# Patient Record
Sex: Female | Born: 1955
Health system: Southern US, Community
[De-identification: ages and names within clinical notes are randomized; demographics above are authoritative.]

## PROBLEM LIST (undated history)

## (undated) DIAGNOSIS — I5189 Other ill-defined heart diseases: Secondary | ICD-10-CM

## (undated) DIAGNOSIS — E781 Pure hyperglyceridemia: Secondary | ICD-10-CM

## (undated) DIAGNOSIS — K219 Gastro-esophageal reflux disease without esophagitis: Secondary | ICD-10-CM

## (undated) DIAGNOSIS — H269 Unspecified cataract: Secondary | ICD-10-CM

## (undated) DIAGNOSIS — Z9289 Personal history of other medical treatment: Secondary | ICD-10-CM

## (undated) DIAGNOSIS — Q613 Polycystic kidney, unspecified: Secondary | ICD-10-CM

## (undated) DIAGNOSIS — I10 Essential (primary) hypertension: Secondary | ICD-10-CM

## (undated) DIAGNOSIS — J449 Chronic obstructive pulmonary disease, unspecified: Secondary | ICD-10-CM

## (undated) DIAGNOSIS — E785 Hyperlipidemia, unspecified: Secondary | ICD-10-CM

## (undated) DIAGNOSIS — R002 Palpitations: Secondary | ICD-10-CM

## (undated) DIAGNOSIS — I059 Rheumatic mitral valve disease, unspecified: Secondary | ICD-10-CM

## (undated) DIAGNOSIS — T7840XA Allergy, unspecified, initial encounter: Secondary | ICD-10-CM

## (undated) DIAGNOSIS — G4730 Sleep apnea, unspecified: Secondary | ICD-10-CM

## (undated) DIAGNOSIS — E7800 Pure hypercholesterolemia, unspecified: Secondary | ICD-10-CM

## (undated) DIAGNOSIS — M1990 Unspecified osteoarthritis, unspecified site: Secondary | ICD-10-CM

## (undated) DIAGNOSIS — K5792 Diverticulitis of intestine, part unspecified, without perforation or abscess without bleeding: Secondary | ICD-10-CM

## (undated) HISTORY — DX: Palpitations: R00.2

## (undated) HISTORY — DX: Pure hyperglyceridemia: E78.1

## (undated) HISTORY — DX: Polycystic kidney, unspecified: Q61.3

## (undated) HISTORY — DX: Pure hypercholesterolemia, unspecified: E78.00

## (undated) HISTORY — DX: Hyperlipidemia, unspecified: E78.5

## (undated) HISTORY — DX: Personal history of other medical treatment: Z92.89

## (undated) HISTORY — DX: Sleep apnea, unspecified: G47.30

## (undated) HISTORY — DX: Other ill-defined heart diseases: I51.89

## (undated) HISTORY — DX: Allergy, unspecified, initial encounter: T78.40XA

## (undated) HISTORY — PX: UPPER GASTROINTESTINAL ENDOSCOPY: SHX188

## (undated) HISTORY — PX: COLONOSCOPY: SHX174

## (undated) HISTORY — DX: Unspecified cataract: H26.9

## (undated) HISTORY — DX: Essential (primary) hypertension: I10

## (undated) HISTORY — DX: Rheumatic mitral valve disease, unspecified: I05.9

## (undated) HISTORY — PX: CATARACT EXTRACTION: SUR2

## (undated) HISTORY — PX: OTHER SURGICAL HISTORY: SHX169

## (undated) HISTORY — PX: TOTAL ABDOMINAL HYSTERECTOMY W/ BILATERAL SALPINGOOPHORECTOMY: SHX83

## (undated) HISTORY — DX: Unspecified osteoarthritis, unspecified site: M19.90

## (undated) HISTORY — DX: Chronic obstructive pulmonary disease, unspecified: J44.9

## (undated) HISTORY — DX: Gastro-esophageal reflux disease without esophagitis: K21.9

---

## 2001-06-05 ENCOUNTER — Encounter: Payer: Self-pay | Admitting: Cardiology

## 2001-06-05 ENCOUNTER — Ambulatory Visit (HOSPITAL_COMMUNITY): Admission: RE | Admit: 2001-06-05 | Discharge: 2001-06-05 | Payer: Self-pay | Admitting: Cardiology

## 2001-08-15 ENCOUNTER — Ambulatory Visit (HOSPITAL_COMMUNITY): Admission: RE | Admit: 2001-08-15 | Discharge: 2001-08-15 | Payer: Self-pay | Admitting: Family Medicine

## 2001-08-15 ENCOUNTER — Encounter: Payer: Self-pay | Admitting: Family Medicine

## 2001-09-02 ENCOUNTER — Encounter: Admission: RE | Admit: 2001-09-02 | Discharge: 2001-12-01 | Payer: Self-pay | Admitting: Orthopedic Surgery

## 2001-10-14 ENCOUNTER — Other Ambulatory Visit: Admission: RE | Admit: 2001-10-14 | Discharge: 2001-10-14 | Payer: Self-pay | Admitting: Gynecology

## 2001-12-17 ENCOUNTER — Encounter: Payer: Self-pay | Admitting: Gynecology

## 2001-12-25 ENCOUNTER — Encounter (INDEPENDENT_AMBULATORY_CARE_PROVIDER_SITE_OTHER): Payer: Self-pay | Admitting: *Deleted

## 2001-12-26 ENCOUNTER — Inpatient Hospital Stay (HOSPITAL_COMMUNITY): Admission: RE | Admit: 2001-12-26 | Discharge: 2001-12-27 | Payer: Self-pay | Admitting: Gynecology

## 2003-01-20 ENCOUNTER — Other Ambulatory Visit: Admission: RE | Admit: 2003-01-20 | Discharge: 2003-01-20 | Payer: Self-pay | Admitting: Gynecology

## 2004-01-26 ENCOUNTER — Other Ambulatory Visit: Admission: RE | Admit: 2004-01-26 | Discharge: 2004-01-26 | Payer: Self-pay | Admitting: Gynecology

## 2005-07-04 ENCOUNTER — Emergency Department (HOSPITAL_COMMUNITY): Admission: EM | Admit: 2005-07-04 | Discharge: 2005-07-04 | Payer: Self-pay | Admitting: Emergency Medicine

## 2005-07-14 ENCOUNTER — Ambulatory Visit: Payer: Self-pay | Admitting: Cardiology

## 2005-07-20 ENCOUNTER — Ambulatory Visit: Payer: Self-pay | Admitting: Gastroenterology

## 2005-07-28 ENCOUNTER — Ambulatory Visit: Payer: Self-pay

## 2005-07-31 ENCOUNTER — Encounter (INDEPENDENT_AMBULATORY_CARE_PROVIDER_SITE_OTHER): Payer: Self-pay | Admitting: *Deleted

## 2005-07-31 ENCOUNTER — Ambulatory Visit: Payer: Self-pay | Admitting: Gastroenterology

## 2005-08-23 ENCOUNTER — Other Ambulatory Visit: Admission: RE | Admit: 2005-08-23 | Discharge: 2005-08-23 | Payer: Self-pay | Admitting: Gynecology

## 2005-08-31 ENCOUNTER — Ambulatory Visit: Payer: Self-pay | Admitting: Gastroenterology

## 2005-11-02 ENCOUNTER — Ambulatory Visit: Payer: Self-pay | Admitting: Gastroenterology

## 2005-11-14 ENCOUNTER — Ambulatory Visit (HOSPITAL_COMMUNITY): Admission: RE | Admit: 2005-11-14 | Discharge: 2005-11-14 | Payer: Self-pay | Admitting: Unknown Physician Specialty

## 2006-09-25 ENCOUNTER — Other Ambulatory Visit: Admission: RE | Admit: 2006-09-25 | Discharge: 2006-09-25 | Payer: Self-pay | Admitting: Gynecology

## 2007-08-19 ENCOUNTER — Ambulatory Visit: Payer: Self-pay | Admitting: *Deleted

## 2007-08-20 ENCOUNTER — Observation Stay (HOSPITAL_COMMUNITY): Admission: EM | Admit: 2007-08-20 | Discharge: 2007-08-20 | Payer: Self-pay | Admitting: Emergency Medicine

## 2008-10-20 DIAGNOSIS — R002 Palpitations: Secondary | ICD-10-CM | POA: Insufficient documentation

## 2008-12-11 ENCOUNTER — Encounter (INDEPENDENT_AMBULATORY_CARE_PROVIDER_SITE_OTHER): Payer: Self-pay | Admitting: *Deleted

## 2008-12-11 DIAGNOSIS — K219 Gastro-esophageal reflux disease without esophagitis: Secondary | ICD-10-CM | POA: Insufficient documentation

## 2008-12-11 DIAGNOSIS — M5030 Other cervical disc degeneration, unspecified cervical region: Secondary | ICD-10-CM | POA: Insufficient documentation

## 2008-12-11 DIAGNOSIS — E785 Hyperlipidemia, unspecified: Secondary | ICD-10-CM | POA: Insufficient documentation

## 2008-12-11 DIAGNOSIS — I080 Rheumatic disorders of both mitral and aortic valves: Secondary | ICD-10-CM | POA: Insufficient documentation

## 2010-02-14 ENCOUNTER — Encounter: Payer: Self-pay | Admitting: Orthopedic Surgery

## 2010-02-14 ENCOUNTER — Emergency Department (HOSPITAL_COMMUNITY)
Admission: EM | Admit: 2010-02-14 | Discharge: 2010-02-14 | Payer: Self-pay | Source: Home / Self Care | Admitting: Emergency Medicine

## 2010-02-15 ENCOUNTER — Telehealth: Payer: Self-pay | Admitting: Orthopedic Surgery

## 2010-02-15 ENCOUNTER — Ambulatory Visit: Payer: Self-pay | Admitting: Orthopedic Surgery

## 2010-02-15 DIAGNOSIS — S92309A Fracture of unspecified metatarsal bone(s), unspecified foot, initial encounter for closed fracture: Secondary | ICD-10-CM | POA: Insufficient documentation

## 2010-02-15 DIAGNOSIS — S9030XA Contusion of unspecified foot, initial encounter: Secondary | ICD-10-CM | POA: Insufficient documentation

## 2010-03-03 ENCOUNTER — Encounter: Payer: Self-pay | Admitting: Orthopedic Surgery

## 2010-03-30 ENCOUNTER — Ambulatory Visit
Admission: RE | Admit: 2010-03-30 | Discharge: 2010-03-30 | Payer: Self-pay | Source: Home / Self Care | Attending: Orthopedic Surgery | Admitting: Orthopedic Surgery

## 2010-04-14 NOTE — Letter (Signed)
Summary: Out of Work  [facility]  [address]  [place], [place] [address]   Phone: [phone]  Fax: [phone]    February 15, 2010   Employee:  Shelley [name]    To Whom It May Concern:   For Medical reasons, please excuse the above named employee from work for the following dates:  Start:   02/14/10  End/Return to work:  02/15/10         Next scheduled appointment  03/30/10 3:30pm   If you need additional information, please feel free to contact our office.         Sincerely,    Terrance Mass, MD

## 2010-04-14 NOTE — Letter (Signed)
Summary: Letter of medical necessity for a cane  Letter of medical necessity for a cane   Imported By: Jacklynn Ganong 03/08/2010 09:59:05  _____________________________________________________________________  External Attachment:    Type:   Image     Comment:   External Document

## 2010-04-14 NOTE — Progress Notes (Signed)
Summary: Sleep with boot on?  Phone Note Call from Patient   Summary of Call: Shelley [name] (2055/04/26)  Wants to know if she is supposed to sleep with the boot on. Her # [id number] Initial call taken by: Shelley [name],  February 15, 2010 11:44 AM  Follow-up for Phone Call        no Follow-up by: Fuller Canada MD,  February 15, 2010 3:25 PM  Additional Follow-up for Phone Call Additional follow up Details #1::        Advised patient Additional Follow-up by: Shelley [name],  February 15, 2010 5:08 PM

## 2010-04-14 NOTE — Assessment & Plan Note (Signed)
Summary: 6 WK RE-CK FOOT/BCBS/CAF   Visit Type:  Follow-up Referring Provider:  [facility]  CC:  left foot fracture.  History of Present Illness: I saw Shelley [name] in the office today for a 6 week  followup visit.  She is a 55 years old woman with the complaint of:  left foot  small avulsion fracture of the proximal 5th Metatarsal   DOI 02-14-10.  Xrays at [facility] on 02-14-10, show transverse fracture, base of left fifth metatarsal. Right foot question spurring at first metatarsal head, no definite acute focal bony abnormalities of right foot.  Medications: Diltiazem HCL ER, Bisoprolol Fumarate, Crestor, Fenofibrate, Alprazolam, Amitriptyline, Prevacid, Trmadol, Ibuprofen.  Complaints: She still has some soreness in the back of her heel.   Allergies: 1)  ! Penicillin 2)  ! Codeine 3)  ! Morphine   Other Orders: Est. Patient Level II (57846)  Patient Instructions: 1)  Please schedule a follow-up appointment as needed. 2)  Take boot off 3)  Do the exercises as shown to help the achilles tendon.   Orders Added: 1)  Est. Patient Level II [96295]

## 2010-04-14 NOTE — Medication Information (Signed)
Summary: Tax adviser   Imported By: Cammie Sickle 02/15/2010 18:40:21  _____________________________________________________________________  External Attachment:    Type:   Image     Comment:   External Document

## 2010-04-14 NOTE — Letter (Signed)
Summary: History form  History form   Imported By: Jacklynn Ganong 02/17/2010 08:54:58  _____________________________________________________________________  External Attachment:    Type:   Image     Comment:   External Document

## 2010-04-14 NOTE — Assessment & Plan Note (Signed)
Summary: ER/fx foot/xrays ap/bcbs/frs   Vital Signs:  Patient profile:   55 year old female Height:      65.5 inches Weight:      173 pounds Temp:     98.7 degrees F  Visit Type:  new patient Referring Provider:  [facility]  CC:  left foot fracture.  History of Present Illness:   Xrays at [facility] on 02-14-10, show transverse fracture, base of left fifth metatarsal. Right foot question spurring at first metatarsal head, no definite acute focal bony abnormalities of right foot.  Medications: Diltiazem HCL ER, Bisoprolol Fumarate, Crestor, Fenofibrate, Alprazolam, Amitriptyline, Prevacid, Trmadol, Ibuprofen.  Shelley [name] stepped off a curb injuring LEFT foot complains of sharp dull throbbing 8/10 pain which is intermittent associated with bruising and numbness in the RIGHT foot which was also injured, as well as swelling.  She is on tramadol and ibuprofen for pain.  Date of injury was December 5.    Allergies (verified): 1)  ! Penicillin 2)  ! Codeine 3)  ! Morphine  Past History:  Past Surgical History: childbirth T A H and B S O  Family History: Reviewed history from 12/11/2008 and no changes required. Father:age 40 alive high chol..history of coronary artery bypass grafting. Mother:age 62 hbp and high chol  Social History: Reviewed history from 12/11/2008 and no changes required. Full Time Alcohol Use - no Regular Exercise - no Drug Use - no pt has no siblings Tobacco Use - Former.   Review of Systems Constitutional:  Denies weight loss, weight gain, fever, chills, and fatigue. Cardiovascular:  Complains of palpitations; denies chest pain, fainting, and murmurs. Respiratory:  Denies short of breath, wheezing, couch, tightness, pain on inspiration, and snoring . Gastrointestinal:  Complains of heartburn; denies nausea, vomiting, diarrhea, constipation, and blood in your stools. Genitourinary:  Denies frequency, urgency, difficulty urinating, painful urination, flank  pain, and bleeding in urine. Neurologic:  Denies numbness, tingling, unsteady gait, dizziness, tremors, and seizure. Musculoskeletal:  Complains of joint pain and muscle pain; denies swelling, instability, stiffness, redness, and heat. Endocrine:  Denies excessive thirst, exessive urination, and heat or cold intolerance. Psychiatric:  Denies nervousness, depression, anxiety, and hallucinations. Skin:  Complains of rash; denies changes in the skin, poor healing, itching, and redness. HEENT:  Denies blurred or double vision, eye pain, redness, and watering. Immunology:  Denies seasonal allergies, sinus problems, and allergic to bee stings. Hemoatologic:  Denies easy bleeding and brusing.  Physical Exam  Additional Exam:  GEN: well developed, well nourished, normal grooming and hygiene, no deformity and normal body habitus.   CDV: pulses are normal, no edema, no erythema. no tenderness  Lymph: normal lymph nodes   Skin: no rashes, skin lesions or open sores   NEURO: normal coordination, reflexes, sensation.   Psyche: awake, alert and oriented. Mood normal   Gait: abnormal  RIGHT foot is tender swollen and bruised over the great toe which has painful range of motion no crepitance.  LEFT foot is tender over the fifth metatarsal with tenderness and they are fairly significant, normal muscle tone in the LEFT foot.  Normal range of motion LEFT ankle.  No instability detected LEFT ankle.     Impression & Recommendations:  Problem # 1:  CLOSED FRACTURE OF METATARSAL BONE (ICD-825.25) Assessment New  The x-rays were done at [facility]. The report and the films have been reviewed.  avulsion not Jones fracture !  Short Cam walker x 6 weeks   Orders: New Patient Level III (  [address])  Problem # 2:  CONTUSION, RIGHT FOOT (ICD-924.20) Assessment: New  Patient Instructions: 1)  You have a small avulsion fracture of the proximal 5th Metatarsal  2)  This will heal in 6-8 weeks  3)  Wear this  brace for 6 weeks then we will re-evaluate the foot  4)  OK TO WORK    Orders Added: 1)  New Patient Level III [60454]  Appended Document: ER/fx foot/xrays ap/bcbs/frs social history the patient works in Veterinary surgeon, does not smoke or drink.  Family history heart disease arthritis cancer  Addendum she is divorced.

## 2010-05-25 ENCOUNTER — Other Ambulatory Visit: Payer: Self-pay | Admitting: Gynecology

## 2010-07-26 NOTE — H&P (Signed)
NAME:  Shelley [name], Shelley [name] NO.:  [id number]   MEDICAL RECORD NO.:  [id number]           PATIENT TYPE:   LOCATION:                                 FACILITY:   PHYSICIAN:  Shelley Holler, MD     DATE OF BIRTH:  12/20/1955   DATE OF ADMISSION:  DATE OF DISCHARGE:                              HISTORY & PHYSICAL   PRIMARY CARE PHYSICIAN:  Shelley Duel, MD.   PRIMARY CARDIOLOGIST:  Shelley Sans. Wall, MD, [facility].   CHIEF COMPLAINT:  Left arm burning and palpitations.   HISTORY:  [name] is a pleasant 55 year old female who presented to  the emergency department with complaints of palpitations and her left  arm burning.  While she was driving, the patient had onset of left arm  tingling and burning sensation.  There was no radiation of the  discomfort.  The patient took her blood pressure at home and it was in  the 190s systolic.  She had no complaints of chest pain, no shortness of  breath, no nausea or vomiting, no diaphoresis.  The discomfort lasted  for about 3 hours.  Currently, the discomfort is much improved.  The  patient also had complaints of palpitations without syncope or  presyncope.  She has had no recent PND or orthopnea, no lower extremity  swelling.   PAST MEDICAL HISTORY:  1. GERD.  2. Hyperlipidemia.  3. Arrhythmia.  4. Mitral regurgitation according to the patient.   MEDICINES:  1. Atenolol 25 mg p.o. daily.  2. Cartia XT 120 mg p.o. daily.  3. Protonix 40 mg p.o. daily.  4. Niaspan 500 mg p.o. daily.  5. Fenofibrate 160 mg p.o. daily.  6. Crestor 5 mg p.o. daily.  7. Bisoprolol 1/2 tab p.o. daily.   ALLERGIES:  1. CODEINE.  2. PENICILLIN.  3. MORPHINE.   SOCIAL HISTORY:  The patient is a former smoker.   FAMILY HISTORY:  Father with history of coronary artery bypass grafting.   REVIEW OF SYSTEMS:  All systems are reviewed in detail and negative  except as history of present illness.   PHYSICAL EXAMINATION:  VITAL SIGNS:   Temperature 97.8, blood pressure  119/59, heart rate 70, respiratory rate 19, and oxygen saturation 98% on  room air.  GENERAL:  A well-developed, obese female, alert and oriented x3, no  apparent distress.  HEENT:  Head is atraumatic and normocephalic.  Pupils are equal, round,  and reactive to light, extraocular movements intact  NECK:  Supple.  No adenopathy.  No JVD.  No carotid bruits.  CHEST/LUNGS:  Clear to auscultation bilaterally with equal bilateral  breath sounds.  CORONARY:  Regular rhythm, normal rate, normal S1 and S2, no murmurs,  rubs, or gallops.  ABDOMEN:  Soft, nontender, and nondistended.  EXTREMITIES:  No clubbing, cyanosis, or edema.  NEUROLOGIC:  No focal deficits.   EKG shows a normal sinus rhythm with no ischemic changes, normal  intervals.   LABORATORY DATA:  CK-MB less than 1.  Troponin less than 0.05.  Myoglobin 33, hematocrit 39, sodium 135, potassium 3.9, chloride 101,  BUN 8, creatinine 0.7, and glucose 117.   IMPRESSION AND PLAN:  A 55 year old female with palpitations and left  arm burning sensation.   PLAN:  1. Cardiovascular:  Admit the patient to a telemetry bed, rule out      with serial cardiac enzymes, daily EKG, aspirin daily, home dose of      Niaspan, Tricor, Crestor, and atenolol.  We will hold the patient's      other beta-blocker given her dose of atenolol.  Continue calcium      channel blocker.  I doubt that her palpitations and left arm      burning constitute an acute coronary syndrome as was thought by the      emergency room physicians.  No need for anticoagulation at this      time.  2. Fluids, electrolytes, nutrition, CMP, magnesium in the morning,      n.p.o. now.      Shelley Holler, MD  Electronically Signed     TRK/MEDQ  D:  08/20/2007  T:  08/20/2007  Job:  [id number]

## 2010-07-26 NOTE — Discharge Summary (Signed)
NAME:  Shelley [name], Shelley [name]              ACCOUNT NO.:  [id number]   MEDICAL RECORD NO.:  [id number]          PATIENT TYPE:  INP   LOCATION:  2920                         FACILITY:  [facility]   PHYSICIAN:  Noralyn Pick. Eden Emms, MD, FACCDATE OF BIRTH:  1955-11-25   DATE OF ADMISSION:  08/20/2007  DATE OF DISCHARGE:  08/20/2007                               DISCHARGE SUMMARY   PRIMARY CARDIOLOGIST:  Maisie Fus C. Daleen Squibb, MD, [facility]   PRIMARY CARE PHYSICIAN:  Patrica Duel, M.D.   DISCHARGE DIAGNOSIS:  Left arm burning.   SECONDARY DIAGNOSES:  1. History of cervical disk disease.  2. Hypertension.  3. Hyperlipidemia and hypertriglyceridemia.  4. Chronic history of palpitations.  5. GERD.  6. History of mitral regurgitation.   ALLERGIES:  CODEINE, PENICILLIN, MORPHINE.   PROCEDURES:  None.   HISTORY OF PRESENT ILLNESS:  A 55 year old Caucasian female with prior  history of palpitations.  She was in her usual state of health until  yesterday afternoon when driving home from work she noted left neck,  shoulder and arm burning without associated symptoms.  Symptoms would  occur and last approximately 3-5 minutes and then resolve spontaneously.  Her symptoms continued on an intermittent basis without limiting  activities for about three hours.  She then presented to the [facility]  Emergency Department for further evaluation.  In the ED, her cardiac  markers were negative, and ECG showed no acute changes.  She was  admitted for further evaluation.   HOSPITAL COURSE:  The patient ruled out for MI by cardiac markers.  This  morning, she noted some mild, reproducible soreness with palpation over  the left arm, but otherwise, the burning itself had resolved.  At no  point did she have dyspnea or chest pain.  Her ECG has remained without  acute changes.  We do not feel that any additional cardiac evaluation is  needed at this time.  We have recommended that she undergo outpatient  MRI and follow up with Dr.  Nobie Putnam given her history of cervical disk  disease and possible cervical radiculopathy.  The patient is being  discharge home today in good condition.   DISCHARGE LABORATORY DATA:  Hemoglobin 12.3, hematocrit 35.1, WBC 6.0,  platelets 369.  Sodium 139, potassium 4.8, chloride 101, CO2 30, BUN 5,  creatinine 0.76, glucose 110, total bilirubin 0.5, alk-phos 38, AST 25,  ALT 21.  Chest x-ray showed mild bronchitic changes.  Cardiac markers  negative x3.  Magnesium 2.1.   DISPOSITION:  The patient is being discharged home today in good  condition.   FOLLOWUP:  We have asked her to follow up with Dr. Nobie Putnam in the next  1-2 weeks.  The patient may need to be evaluated with outpatient MRI to  reassess her disease.   DISCHARGE MEDICATIONS:  1. Atenolol 25 mg daily.  2. Protonix 40 mg daily.  3. Cardia XT 120 mg daily.  4. Niaspan 500 mg q.h.s.  5. Crestor 5 mg q.h.s.  6. Tricor 160 mg daily.  7. Bisoprolol 5 mg half tablet daily.   OUTSTANDING LABORATORY STUDIES:  None.  DURATION OF DISCHARGE ENCOUNTER:  Sixty minutes including physician  time.      Nicolasa Ducking, ANP      Noralyn Pick. Eden Emms, MD, [facility]  Electronically Signed    CB/MEDQ  D:  08/20/2007  T:  08/20/2007  Job:  [id number]   cc:   Patrica Duel, M.D.

## 2010-07-29 NOTE — Assessment & Plan Note (Signed)
[facility]                           GASTROENTEROLOGY OFFICE NOTE   [name], Shelley [name]                     MRN:          [id number]  DATE:11/02/2005                            DOB:          1955/07/20    Shelley [name] could not tolerate Protonix and is back on AcipHex twice a day.  She  continues to complain of bloating and early satiety in her epigastric area.  Despite these complaints, she has gained five pounds in weight.  Her vital  signs are all normal as is her abdominal exam.  I cannot appreciate a  succussion splash in the epigastric area.   I have gone ahead and set Shelley [name] up for technetium gastric emptying scan to  see if she has gastroparesis.  If so, she may be a candidate for domperidone  or Reglan use.  In the interim, I have renewed her AcipHex and placed her on  a gastroparesis diet.  Her bowels are doing well and she denies lower GI  complaints at this time.  She is to continue her other medications as per  Dr. Nobie Putnam.  We will call her after her emptying scan but I have scheduled  her to see me back in one month's time.                                   Vania Rea. Jarold Motto, MD, [facility], [place]   DRP/MedQ  DD:  11/02/2005  DT:  11/02/2005  Job #:  [id number]   cc:   Patrica Duel, MD

## 2010-07-29 NOTE — Discharge Summary (Signed)
NAME:  Shelley [name], Shelley [name]                        ACCOUNT NO.:  [id number]   MEDICAL RECORD NO.:  [id number]                   PATIENT TYPE:  INP   LOCATION:  0443                                 FACILITY:  [facility]   PHYSICIAN:  Gretta Cool, M.D.              DATE OF BIRTH:  1955-07-07   DATE OF ADMISSION:  12/26/2001  DATE OF DISCHARGE:  12/27/2001                                 DISCHARGE SUMMARY   HISTORY OF PRESENT ILLNESS:  The patient is a 55 year old female gravida 2,  para 1 who presents to the office with a complaint of increasingly severe  and incapacitating cyclic pelvic pain.  She has difficulties with dysuria,  frequency, and rectal urgency.  She has had a gynecological evaluation which  revealed ovarian and uterine enlargement.  Ultrasound evaluation reveals an  endometrioma of the right ovary with a very complex structure.  The  endometrium is thickened at 1.5 cm.  The uterus is heterogeneous, compatible  with adenomyosis and it is enlarged, particularly at the fundus.  CA-125 at  mid cycle was elevated at 32.7.  Because of her history of incapacitating of  cyclic pelvic pain, she is now admitted for diagnostic laparoscopy with  possible laparotomy, possible hysterectomy with salpingo-oophorectomy.  Alternative forms of therapy were discussed with the patient and she feels  she cannot tolerate the discomfort any longer and is now admitted for  definitive therapy.   PHYSICAL EXAMINATION:  CHEST:  Clear to A&P.  HEART:  Rate and rhythm were regular without murmur, gallop, cardiac  enlargement.  ABDOMEN:  Soft and scaphoid without masses or organomegaly.  PELVIC:  External genitalia within normal limits for female.  Vagina clean  and rugose.  Cervix is parous and clean.  The cervix is high in the vagina  and difficult to access.  Very little descent is noted.  The uterus is  approximately 8 weeks in size.  Adnexal fullness bilaterally.  She is tender  to manipulation  with a palpable mass behind the cervix on the right.  The  left ovary was normal.  Rectovaginal examination confirms.   IMPRESSION:  1. Incapacitating cyclic pelvic pain compatible with adenomyosis and/or     endometriosis.  2. Ultrasound findings of complex adnexal mass with endometrioma on the     right.  3. Hypertension.  4. Dyslipidemia.  5. Reflux.   PLAN:  Diagnostic laparoscopy, possible laparotomy, total abdominal  hysterectomy, bilateral salpingo-oophorectomy, excision of any  endometriosis.  Risks and benefits have been discussed with the patient and  she accepts these procedures.   LABORATORY DATA:  Admission hemoglobin 12.3, hematocrit 36.1, white count  4.8.  The remainder of the preoperative laboratory work was within normal  limits.  EKG:  Normal sinus rhythm.  Chest x-ray:  Atelectasis versus scar  in the left mid lung field.   HOSPITAL COURSE:  The patient underwent supracervical hysterectomy,  bilateral salpingo-oophorectomy,  lysis of extensive adhesions, excision of  endometriosis, diagnostic laparoscopy begun, but discontinued due to severe  endometriosis with adhesions beyond the capability of the laparoscope to  resolve.  The procedures were completed without any complications and the  patient was returned to the recovery room in excellent condition.  Pathology  report revealed benign endometrial polyp, adenomyosis, leiomyomas, serosa  with features of endometriosis, benign ovaries with bilateral endometriomas  and serosal endometriosis, and focus consistent with serosal endometriosis  of one fallopian tube.  Her postoperative course was without complications  and she was discharged on the second postoperative day in excellent  condition.   FINAL DISCHARGE INSTRUCTIONS:  No heavy lifting or straining.  No vaginal  entrance.  Increase ambulation as tolerated.  She is to call for any fever  over 100.5 or failure of daily improvement.  Diet:  Regular.  She is  to  return to our office in one week for follow-up.   DISCHARGE MEDICATIONS:  1. Vioxx 25 mg daily.  2. Tylox one p.o. q.4h. p.r.n.   CONDITION ON DISCHARGE:  Excellent.   FINAL DISCHARGE DIAGNOSES:  1. Incapacitating cyclic pelvic pain.  2. Endometriosis.  3. Adenomyosis.  4. Leiomyomas.  5. Bilateral ovarian endometriomas with severe endometriosis stage III.   PROCEDURES PERFORMED:  1. Supracervical hysterectomy and bilateral salpingo-oophorectomy.  2. Lysis of extensive adhesions.  3. Excision of endometriosis.  4. Diagnostic laparoscopy begun, but discontinued due to severe     endometriosis with adhesions beyond the capacity of the laparoscope to     resolve.  5. General anesthesia.     Matt Holmes, N.P.                          Gretta Cool, M.D.    EMK/MEDQ  D:  01/13/2002  T:  01/13/2002  Job:  [id number]   cc:   Thomas C. Daleen Squibb, M.D. [facility]   Patrica Duel, M.D.  [address]  [place]  [place] [address]  Fax: [phone]

## 2010-07-29 NOTE — Op Note (Signed)
NAME:  Shelley [name], Shelley [name]                        ACCOUNT NO.:  [id number]   MEDICAL RECORD NO.:  [id number]                   PATIENT TYPE:  AMB   LOCATION:  DAY                                  FACILITY:  [facility]   PHYSICIAN:  Gretta Cool, M.D.              DATE OF BIRTH:  1955-06-19   DATE OF PROCEDURE:  DATE OF DISCHARGE:                                 OPERATIVE REPORT   PREOPERATIVE DIAGNOSES:  Endometriosis with incapacitating cyclic pelvic  pain.   POSTOPERATIVE DIAGNOSES:  Bilateral ovarian endometriomas with severe  endometriosis stage III.   PROCEDURE:  1. Supracervical hysterectomy and bilateral salpingo-oophorectomy.  2. Lysis of extensive adhesions.  3. Excision of endometriosis.  4. Diagnostic laparoscopy begun, but discontinued because of severe     endometriosis with adhesions beyond the capability of laparoscopy to     resolve.   SURGEON:  Gretta Cool, M.D.   ASSISTANT:  Raynald Kemp, M.D.   ANESTHESIA:  General orotracheal.   DESCRIPTION OF PROCEDURE:  Under excellent general anesthesia the patient  was prepped and draped in Allen stirrups and Hulka tenaculum applied to her  cervix and bladder drained.  A subumbilical incision was made and Veress  cannula introduced.  After adequate pneumoperitoneum laparoscope and trocar  was introduced and pelvic organs visualized.  Severe endometriosis was noted  on many of the pelvic surfaces.  The uterus was enlarged approximately 8-10  weeks size with multiple leiomyomata versus adenomyosis.  Both ovaries were  firmly fixed in the pelvis to the posterior aspect of the cervix on the  rectosigmoid.  They could not be mobilized by blunt dissection and sharp  dissection was not possible because of the large ovarian endometriomas.  At  this point decision was made to proceed to laparotomy.  A Pfannenstiel  incision was then made an incision extended through the fascia.  Peritoneum  was opened and the abdomen  explored.  No abnormalities identified in the  upper abdomen.  The laparoscopic findings were confirmed.  At this point the  ovaries were mobilized by blunt and sharp dissection.  The adnexal pedicles  were then clamped across with Kelly clamps and the uterus elevated.  The  round ligaments were then transected by cautery.  The infundibulopelvic  vessels were then isolated from ureter, clamped, cut, sutured, and tied with  0 Vicryl.  The uterine vessels were then skeletonized, clamped, cut,  sutured, and tied with 0 Vicryl.  The cardinal ligaments were then also  clamped, cut, sutured, and tied with 0 Vicryl.  At this point because of  difficulty with access and because of difficult visibility with this  incision and excellent support of the patient preoperatively, decision was  made to proceed to supracervical hysterectomy.  The cervix was transected  and then cauterized centrally.  It was then approximated with a running  suture of 0 Vicryl from anterior to posterior.  Bleeding was well  controlled.  At this point the pelvic pedicles were all reevaluated and were  dry.  The endometriosis was excised from the posterior aspect of the uterus  were treated by monopolar cautery.  At this point the pelvic flow was  reperitonealized with a running suture of number 2-0 Monocryl.  The packs  and retractors were then removed.  Pelvis was irrigated to remove all debris  and the endometriosis and material that leaked from the ovary.  The  abdominoperitoneum was then closed with a running suture of 0 Monocryl.  The  rectus muscles __________ in the midline with a running suture of 2-0  Vicryl.  The fascia was approximated with 0 Vicryl from each angle to  midline.  Subcutaneous tissue was approximated with 2-0 Vicryl and skin closed with  skin staples and Steri-Strips.  The laparoscopic ports were closed with 5-0  Vicryl and Steri-Strips.  At this point procedure was terminated and patient  returned  to the recovery room in excellent condition.  Sponge and lap counts  were correct.  No complications.                                               Gretta Cool, M.D.    CWL/MEDQ  D:  12/25/2001  T:  12/25/2001  Job:  [id number]

## 2010-07-29 NOTE — H&P (Signed)
NAME:  Shelley [name], Shelley [name]                        ACCOUNT NO.:  [id number]   MEDICAL RECORD NO.:  [id number]                   PATIENT TYPE:  AMB   LOCATION:  DAY                                  FACILITY:  [facility]   PHYSICIAN:  Gretta Cool, M.D.              DATE OF BIRTH:  08-03-1955   DATE OF ADMISSION:  12/25/2001  DATE OF DISCHARGE:                                HISTORY & PHYSICAL   PREOPERATIVE DIAGNOSIS:  Incapacitating cyclic pelvic pain.   HISTORY OF PRESENT ILLNESS:  A 55 year old gravida 2, para 1 under the  primary care of Dr. Nobie Putnam of [place], [facility], also under the  care of Dr. Valera Castle of [facility].  She gives a history of  increasingly severe and incapacitating cyclic pelvic pain.  She also has  sporadic difficulties with dysuria, frequency and with rectal urgency.  She  is menstrual.  She has had gynecologic evaluation which revealed ovarian and  uterine enlargement.  Ultrasound examination reveals what appears to be  endometrioma of her right ovary with very complex structure.  The  endometrium is quite thickened at 1.5 cm.  Her uterus is very heterogeneous  compatible with adenomyosis that is enlarged, particularly broad fundus.  She had CA125 on mid cycles to look for elevation suggestive of  endometriosis and it was elevated to 32.7.  Because of her history of  incapacitating pain, she is now admitted for diagnostic laparoscopy with  possible laparotomy, possible hysterectomy with salpingo-oophorectomy.  She  understands the alternative forms of therapy.  She does not feel that she  can tolerate the discomfort.  She is now admitted for definitive therapy.   PAST MEDICAL HISTORY:  She has had the usual childhood diseases without  sequelae.  The patient has hypertension on multi-ACE therapy.  She also has  hypertriglyceridemia and __________  lipid profile she is on Lopid for  control.  She has mitral valve prolapse, needs prophylaxis.   FAMILY HISTORY:  She has a strong family history of cardiovascular disease  at an early age.  Father has had heart disease and disabled at age 20.  Mother has hypertension and hyperparathyroidism with thyroid problems.  Remote family history of breast and kidney cancer.   ALLERGIES:  She is intolerant of PENICILLIN.  Remote history of childhood  swelling.  She has tolerated cephalosporins since then without reaction.   REVIEW OF SYSTEMS:  HEENT:  None.  __________.  GI:  She denies frequency,  urgency, dysuria, change of bowel habits, food intolerance.  Complains of  pelvic pain as above.   PHYSICAL EXAMINATION:  GENERAL APPEARANCE:  A well-developed female.  VITAL SIGNS:  Blood pressure elevation on medications, well controlled at  present.  She is considerably over ideal weight with a very high __________  ratio.  HEENT:  Pupils are equal, round and reactive to light.  Fundi not examined.  Oropharynx clear.  NECK:  Supple without mass or thyroid enlargement.  CHEST:  Clear to percussion and auscultation.  CARDIOVASCULAR:  Regular rhythm without murmurs, cardiac enlargement.  BREASTS:  Without masses, nodes or nipple discharge.  Node-bearing areas  negative.  ABDOMEN:  Soft, scaphoid without mass or organomegaly.  PELVIC:  External genitalia normal female.  Vagina clean __________.  Cervix  is parous, clean.  The cervix is very high in the vagina and it is difficult  to access.  Very little descent.  The uterus is approximately eight weeks'  size, adnexa fullness bilaterally.  Tender to manipulation with a mass  palpated behind the cervix on the right.  Left ovary is normal.  Rectovaginal examination confirms.  EXTREMITIES:  Negative.  NEUROLOGIC:  Physiologic.   IMPRESSION:  1. Incapacitating cyclic pelvic pain compatible with adenomyosis.     Endometriosis.  2. Ultrasound finding of complex adnexal mass with endometrioma, right.  3. Hypertension.  4. Dyslipidemia.  5.  Reflux.   PLAN:  Discussed alternative therapy including diagnostic laparoscopy,  possible laparotomy, total abdominal hysterectomy, bilateral salpingo-  oophorectomy, excision of all possible endometriosis. Discussed all options  including no therapy.  She wishes to proceed with intensive therapy  __________  degree of discomfort.  Plan mitral valve prophylaxis.                                                Gretta Cool, M.D.    CWL/MEDQ  D:  12/25/2001  T:  12/25/2001  Job:  [id number]   cc:   Patrica Duel, MD  [address], [address]  [place]  [place] [address]  Fax: [phone]   Jesse Sans. Wall, M.D. [facility]

## 2010-12-08 LAB — POCT CARDIAC MARKERS
CKMB, poc: <1 — ABNORMAL LOW
Operator id: 277751
Troponin i, poc: <0.05
Troponin i, poc: <0.05

## 2010-12-08 LAB — COMPREHENSIVE METABOLIC PANEL
ALT: 21
AST: 25
Alkaline Phosphatase: 38 — ABNORMAL LOW
Calcium: 9.6
GFR calc Af Amer: >60
Glucose, Bld: 110 — ABNORMAL HIGH
Potassium: 4.8
Sodium: 139
Total Protein: 6.5

## 2010-12-08 LAB — CBC
Hemoglobin: 12.3
MCHC: 34.9
RBC: 3.89
RDW: 12.7

## 2010-12-08 LAB — CARDIAC PANEL(CRET KIN+CKTOT+MB+TROPI)
CK, MB: 1.2
Relative Index: INVALID
Total CK: 74

## 2010-12-08 LAB — POCT I-STAT, CHEM 8
Chloride: 101
HCT: 39
Hemoglobin: 13.3
Potassium: 3.9

## 2010-12-08 LAB — MAGNESIUM: Magnesium: 2.1

## 2011-02-21 ENCOUNTER — Telehealth: Payer: Self-pay | Admitting: Gastroenterology

## 2011-02-21 NOTE — Telephone Encounter (Signed)
Informed pt I will get the chart and call her back; pt stated understanding.

## 2011-02-22 ENCOUNTER — Other Ambulatory Visit (HOSPITAL_COMMUNITY): Payer: Self-pay | Admitting: Physician Assistant

## 2011-02-22 DIAGNOSIS — R109 Unspecified abdominal pain: Secondary | ICD-10-CM

## 2011-02-23 NOTE — Telephone Encounter (Signed)
After reviewing chart, informed pt her COLON recall will be in 2017 since the prior one in 2007 was normal. There was no Barrett's, so EGD is when she has problems. Pt stated she's having problems and she is scheduled for an Abd.U/S at  [facility] tomorrow for possible Gall Bladder problems. Pt will call back if she needs [place] and U/S is neg.

## 2011-02-24 ENCOUNTER — Ambulatory Visit (HOSPITAL_COMMUNITY)
Admission: RE | Admit: 2011-02-24 | Discharge: 2011-02-24 | Disposition: A | Discharge status: Home / Self Care | Payer: BC Managed Care – PPO | Source: Ambulatory Visit | Attending: Physician Assistant | Admitting: Physician Assistant

## 2011-02-24 ENCOUNTER — Encounter: Payer: Self-pay | Admitting: Gastroenterology

## 2011-02-24 DIAGNOSIS — K869 Disease of pancreas, unspecified: Secondary | ICD-10-CM | POA: Insufficient documentation

## 2011-02-24 DIAGNOSIS — K7689 Other specified diseases of liver: Secondary | ICD-10-CM | POA: Insufficient documentation

## 2011-02-24 DIAGNOSIS — R109 Unspecified abdominal pain: Secondary | ICD-10-CM

## 2011-03-01 ENCOUNTER — Other Ambulatory Visit (HOSPITAL_COMMUNITY): Payer: Self-pay | Admitting: Physician Assistant

## 2011-03-01 DIAGNOSIS — R109 Unspecified abdominal pain: Secondary | ICD-10-CM

## 2011-03-02 ENCOUNTER — Ambulatory Visit (HOSPITAL_COMMUNITY)
Admission: RE | Admit: 2011-03-02 | Discharge: 2011-03-02 | Disposition: A | Discharge status: Home / Self Care | Payer: BC Managed Care – PPO | Source: Ambulatory Visit | Attending: Physician Assistant | Admitting: Physician Assistant

## 2011-03-02 DIAGNOSIS — R109 Unspecified abdominal pain: Secondary | ICD-10-CM | POA: Insufficient documentation

## 2011-03-02 DIAGNOSIS — R9389 Abnormal findings on diagnostic imaging of other specified body structures: Secondary | ICD-10-CM | POA: Insufficient documentation

## 2011-03-02 MED ORDER — IOHEXOL 300 MG/ML  SOLN
100.0000 mL | Freq: Once | INTRAMUSCULAR | Status: AC | PRN
  Administered 2011-03-02: 100 mL via INTRAVENOUS

## 2011-03-09 ENCOUNTER — Other Ambulatory Visit (HOSPITAL_COMMUNITY): Payer: Self-pay | Admitting: Physician Assistant

## 2011-03-09 DIAGNOSIS — Q638 Other specified congenital malformations of kidney: Secondary | ICD-10-CM

## 2011-03-10 ENCOUNTER — Ambulatory Visit (HOSPITAL_COMMUNITY)
Admission: RE | Admit: 2011-03-10 | Discharge: 2011-03-10 | Disposition: A | Discharge status: Home / Self Care | Payer: BC Managed Care – PPO | Source: Ambulatory Visit | Attending: Physician Assistant | Admitting: Physician Assistant

## 2011-03-10 DIAGNOSIS — K7689 Other specified diseases of liver: Secondary | ICD-10-CM | POA: Insufficient documentation

## 2011-03-10 DIAGNOSIS — N289 Disorder of kidney and ureter, unspecified: Secondary | ICD-10-CM | POA: Insufficient documentation

## 2011-03-10 DIAGNOSIS — R1032 Left lower quadrant pain: Secondary | ICD-10-CM | POA: Insufficient documentation

## 2011-03-10 DIAGNOSIS — Q638 Other specified congenital malformations of kidney: Secondary | ICD-10-CM

## 2011-03-10 MED ORDER — GADOBENATE DIMEGLUMINE 529 MG/ML IV SOLN: 20.0000 mL | Freq: Once | INTRAVENOUS | Status: AC | PRN

## 2011-03-20 ENCOUNTER — Other Ambulatory Visit: Payer: Self-pay | Admitting: Gynecology

## 2011-03-29 ENCOUNTER — Other Ambulatory Visit (HOSPITAL_COMMUNITY): Payer: Self-pay | Admitting: Family Medicine

## 2011-03-29 ENCOUNTER — Ambulatory Visit (HOSPITAL_COMMUNITY)
Admission: RE | Admit: 2011-03-29 | Discharge: 2011-03-29 | Disposition: A | Discharge status: Home / Self Care | Payer: BC Managed Care – PPO | Source: Ambulatory Visit | Attending: Family Medicine | Admitting: Family Medicine

## 2011-03-29 DIAGNOSIS — E785 Hyperlipidemia, unspecified: Secondary | ICD-10-CM

## 2011-03-29 DIAGNOSIS — E781 Pure hyperglyceridemia: Secondary | ICD-10-CM

## 2011-03-29 DIAGNOSIS — R05 Cough: Secondary | ICD-10-CM | POA: Insufficient documentation

## 2011-03-29 DIAGNOSIS — R059 Cough, unspecified: Secondary | ICD-10-CM | POA: Insufficient documentation

## 2011-03-29 DIAGNOSIS — I10 Essential (primary) hypertension: Secondary | ICD-10-CM | POA: Insufficient documentation

## 2011-09-26 ENCOUNTER — Telehealth: Payer: Self-pay | Admitting: Cardiology

## 2011-09-26 NOTE — Telephone Encounter (Signed)
New msg Pt wants to talk to you about seeing Dr Daleen Squibb in November. Please call

## 2011-09-26 NOTE — Telephone Encounter (Signed)
Called patient back. Advised schedule not out yet. She will call back in about 1 month.

## 2011-12-08 ENCOUNTER — Encounter: Payer: BC Managed Care – PPO | Admitting: Cardiology

## 2012-01-18 ENCOUNTER — Encounter: Payer: Self-pay | Admitting: Cardiology

## 2012-01-18 ENCOUNTER — Ambulatory Visit (INDEPENDENT_AMBULATORY_CARE_PROVIDER_SITE_OTHER): Payer: BC Managed Care – PPO | Admitting: Cardiology

## 2012-01-18 VITALS — BP 118/82 | HR 71 | Ht 66.0 in | Wt 167.0 lb

## 2012-01-18 DIAGNOSIS — E785 Hyperlipidemia, unspecified: Secondary | ICD-10-CM

## 2012-01-18 DIAGNOSIS — I080 Rheumatic disorders of both mitral and aortic valves: Secondary | ICD-10-CM

## 2012-01-18 DIAGNOSIS — Z8679 Personal history of other diseases of the circulatory system: Secondary | ICD-10-CM

## 2012-01-18 DIAGNOSIS — I10 Essential (primary) hypertension: Secondary | ICD-10-CM

## 2012-01-18 NOTE — Assessment & Plan Note (Signed)
Stable by exam and history. No indication for echocardiography. 

## 2012-01-18 NOTE — Progress Notes (Signed)
HPI Shelley [name] comes in today for evaluation and management of her history are palpitations, minimal mitral regurgitation by echo years ago, and hypertension as well as hyperlipidemia.  She is doing remarkably well. She denies any palpitations.  Recent blood work by Dr. Phillips Odor showed a total cholesterol 140, HDL 36, triglycerides are 161 which is markedly improved and LDL 64. Her fasting blood sugar was normal. Renal function is stable. LFTs are normal. I've reviewed with patient.  She denies any palpitations. She's taking bisoprolol and diltiazem. Blood pressures been under good control.  Past Medical History  Diagnosis Date  . Heart palpitations     Hx   . Mitral regurgitation   . Hyperlipidemia   . Hypertension   . GERD (gastroesophageal reflux disease)   . Disc disease, degenerative, cervical     Current Outpatient Prescriptions  Medication Sig Dispense Refill  . AMITRIPTYLINE HCL PO Take 12.5 mg by mouth daily.      Marland Kitchen aspirin 81 MG tablet Take 81 mg by mouth daily.      Marland Kitchen BISOPROLOL FUMARATE PO Take 5 mg by mouth daily. Half tablet once daily      . Cholecalciferol (VITAMIN D-3) 5000 UNITS TABS Take by mouth daily.      Marland Kitchen diltiazem (CARTIA XT) 120 MG 24 hr capsule Take 120 mg by mouth daily. 1 tab once daily      . Flaxseed, Linseed, (FLAX SEED OIL) 1000 MG CAPS Take by mouth daily.      . lansoprazole (PREVACID) 15 MG capsule Take 15 mg by mouth daily.      . montelukast (SINGULAIR) 10 MG tablet Take 10 mg by mouth at bedtime.       . rosuvastatin (CRESTOR) 5 MG tablet Take 10 mg by mouth daily.       . hyoscyamine (LEVSIN, ANASPAZ) 0.125 MG tablet as needed.      . metaxalone (SKELAXIN) 800 MG tablet as needed.        Allergies  Allergen Reactions  . Codeine   . Morphine   . Penicillins   . Predicort (Prednisolone)     Family History  Problem Relation Age of Onset  . Hyperlipidemia Mother   . Hypertension Mother   . Hyperlipidemia Father     history of  coronary artery by    History   Social History  . Marital Status: Divorced    Spouse Name: N/A    Number of Children: N/A  . Years of Education: N/A   Occupational History  . full time     Social History Main Topics  . Smoking status: Former Smoker    Types: Cigarettes  . Smokeless tobacco: Not on file  . Alcohol Use: No  . Drug Use: No  . Sexually Active: Not on file   Other Topics Concern  . Not on file   Social History Narrative  . No narrative on file    ROS ALL NEGATIVE EXCEPT THOSE NOTED IN HPI  PE  General Appearance: well developed, well nourished in no acute distress, overweight HEENT: symmetrical face, PERRLA, good dentition  Neck: no JVD, thyromegaly, or adenopathy, trachea midline Chest: symmetric without deformity Cardiac: PMI non-displaced, RRR, normal S1, S2, no gallop, soft systolic murmur at the apex Lung: clear to ausculation and percussion Vascular: all pulses full without bruits  Abdominal: nondistended, nontender, good bowel sounds, no HSM, no bruits Extremities: no cyanosis, clubbing or edema, no sign of DVT, no varicosities  Skin: normal color,  no rashes Neuro: alert and oriented x 3, non-focal Pysch: normal affect  EKG Normal sinus rhythm, normal EKG BMET    Component Value Date/Time   NA 139 08/20/2007 0345   K 4.8 08/20/2007 0345   CL 101 08/20/2007 0345   CO2 30 08/20/2007 0345   GLUCOSE 110* 08/20/2007 0345   BUN 5* 08/20/2007 0345   CREATININE 0.76 08/20/2007 0345   CALCIUM 9.6 08/20/2007 0345   GFRNONAA >60 08/20/2007 0345   GFRAA  Value: >60        The eGFR has been calculated using the MDRD equation. This calculation has not been validated in all clinical 08/20/2007 0345    Lipid Panel  No results found for this basename: chol, trig, hdl, cholhdl, vldl, ldlcalc    CBC    Component Value Date/Time   WBC 6.0 08/20/2007 0345   RBC 3.89 08/20/2007 0345   HGB 12.3 08/20/2007 0345   HCT 35.1* 08/20/2007 0345   PLT 369 08/20/2007 0345   MCV 90.2  08/20/2007 0345   MCHC 34.9 08/20/2007 0345   RDW 12.7 08/20/2007 0345

## 2012-01-18 NOTE — Assessment & Plan Note (Signed)
HDL and triglycerides not at goal. Encouraged weight loss and increase activity.

## 2012-01-18 NOTE — Assessment & Plan Note (Signed)
Quiet and well controlled on beta blocker and calcium channel blocker.

## 2012-01-18 NOTE — Patient Instructions (Addendum)
Your physician recommends that you continue on your current medications as directed. Please refer to the Current Medication list given to you today.   Your physician wants you to follow-up in: 1 year with Dr. Wall. You will receive a reminder letter in the mail two months in advance. If you don't receive a letter, please call our office to schedule the follow-up appointment.  

## 2012-01-18 NOTE — Assessment & Plan Note (Signed)
Good control. No change in medical therapy. 

## 2012-02-22 ENCOUNTER — Encounter: Payer: Self-pay | Admitting: Cardiology

## 2012-05-03 ENCOUNTER — Other Ambulatory Visit: Payer: Self-pay | Admitting: Gynecology

## 2013-04-01 ENCOUNTER — Other Ambulatory Visit (HOSPITAL_COMMUNITY): Payer: Self-pay | Admitting: Family Medicine

## 2013-04-01 ENCOUNTER — Ambulatory Visit (HOSPITAL_COMMUNITY)
Admission: RE | Admit: 2013-04-01 | Discharge: 2013-04-01 | Disposition: A | Discharge status: Home / Self Care | Payer: BC Managed Care – PPO | Source: Ambulatory Visit | Attending: Family Medicine | Admitting: Family Medicine

## 2013-04-01 DIAGNOSIS — J449 Chronic obstructive pulmonary disease, unspecified: Secondary | ICD-10-CM

## 2013-04-01 DIAGNOSIS — H9319 Tinnitus, unspecified ear: Secondary | ICD-10-CM

## 2013-04-01 DIAGNOSIS — J4489 Other specified chronic obstructive pulmonary disease: Secondary | ICD-10-CM | POA: Insufficient documentation

## 2013-04-01 DIAGNOSIS — M542 Cervicalgia: Secondary | ICD-10-CM

## 2013-04-01 DIAGNOSIS — M546 Pain in thoracic spine: Secondary | ICD-10-CM | POA: Insufficient documentation

## 2013-04-01 DIAGNOSIS — M549 Dorsalgia, unspecified: Secondary | ICD-10-CM | POA: Insufficient documentation

## 2013-04-07 ENCOUNTER — Other Ambulatory Visit (HOSPITAL_COMMUNITY): Payer: Self-pay | Admitting: Family Medicine

## 2013-04-07 DIAGNOSIS — R519 Headache, unspecified: Secondary | ICD-10-CM

## 2013-04-07 DIAGNOSIS — H9319 Tinnitus, unspecified ear: Secondary | ICD-10-CM

## 2013-04-07 DIAGNOSIS — R200 Anesthesia of skin: Secondary | ICD-10-CM

## 2013-04-07 DIAGNOSIS — R51 Headache: Principal | ICD-10-CM

## 2013-04-08 ENCOUNTER — Ambulatory Visit (HOSPITAL_COMMUNITY)
Admission: RE | Admit: 2013-04-08 | Discharge: 2013-04-08 | Disposition: A | Discharge status: Home / Self Care | Payer: BC Managed Care – PPO | Source: Ambulatory Visit | Attending: Family Medicine | Admitting: Family Medicine

## 2013-04-08 DIAGNOSIS — R51 Headache: Secondary | ICD-10-CM | POA: Insufficient documentation

## 2013-04-08 DIAGNOSIS — R200 Anesthesia of skin: Secondary | ICD-10-CM

## 2013-04-08 DIAGNOSIS — H9319 Tinnitus, unspecified ear: Secondary | ICD-10-CM

## 2013-04-08 DIAGNOSIS — R519 Headache, unspecified: Secondary | ICD-10-CM

## 2013-04-21 ENCOUNTER — Ambulatory Visit: Payer: BC Managed Care – PPO | Attending: Family Medicine | Admitting: Physical Therapy

## 2013-04-21 DIAGNOSIS — R5381 Other malaise: Secondary | ICD-10-CM | POA: Insufficient documentation

## 2013-04-21 DIAGNOSIS — M546 Pain in thoracic spine: Secondary | ICD-10-CM | POA: Insufficient documentation

## 2013-04-21 DIAGNOSIS — R293 Abnormal posture: Secondary | ICD-10-CM | POA: Insufficient documentation

## 2013-04-24 ENCOUNTER — Ambulatory Visit: Payer: BC Managed Care – PPO | Admitting: Physical Therapy

## 2013-04-29 ENCOUNTER — Encounter: Payer: BC Managed Care – PPO | Admitting: Physical Therapy

## 2013-05-01 ENCOUNTER — Ambulatory Visit: Payer: BC Managed Care – PPO | Admitting: Physical Therapy

## 2013-05-05 ENCOUNTER — Ambulatory Visit: Payer: BC Managed Care – PPO | Admitting: Physical Therapy

## 2013-05-08 ENCOUNTER — Encounter: Payer: BC Managed Care – PPO | Admitting: Physical Therapy

## 2013-05-13 ENCOUNTER — Ambulatory Visit: Payer: BC Managed Care – PPO | Attending: Family Medicine | Admitting: Physical Therapy

## 2013-05-13 DIAGNOSIS — R5381 Other malaise: Secondary | ICD-10-CM | POA: Insufficient documentation

## 2013-05-13 DIAGNOSIS — R293 Abnormal posture: Secondary | ICD-10-CM | POA: Insufficient documentation

## 2013-05-13 DIAGNOSIS — M546 Pain in thoracic spine: Secondary | ICD-10-CM | POA: Insufficient documentation

## 2013-05-15 ENCOUNTER — Ambulatory Visit (INDEPENDENT_AMBULATORY_CARE_PROVIDER_SITE_OTHER): Payer: BC Managed Care – PPO | Admitting: Women's Health

## 2013-05-15 ENCOUNTER — Encounter: Payer: Self-pay | Admitting: Women's Health

## 2013-05-15 ENCOUNTER — Encounter: Payer: BC Managed Care – PPO | Admitting: Physical Therapy

## 2013-05-15 VITALS — BP 138/84 | Ht 65.25 in | Wt 174.0 lb

## 2013-05-15 DIAGNOSIS — Z01419 Encounter for gynecological examination (general) (routine) without abnormal findings: Secondary | ICD-10-CM

## 2013-05-15 DIAGNOSIS — K649 Unspecified hemorrhoids: Secondary | ICD-10-CM

## 2013-05-15 MED ORDER — HYDROCORTISONE ACE-PRAMOXINE 2.5-1 % RE CREA: 1.0000 "application " | TOPICAL_CREAM | Freq: Three times a day (TID) | RECTAL | Status: DC

## 2013-05-15 NOTE — Patient Instructions (Signed)
Health Recommendations for Postmenopausal Women Respected and ongoing research has looked at the most common causes of death, disability, and poor quality of life in postmenopausal women. The causes include heart disease, diseases of blood vessels, diabetes, depression, cancer, and bone loss (osteoporosis). Many things can be done to help lower the chances of developing these and other common problems: CARDIOVASCULAR DISEASE Heart Disease: A heart attack is a medical emergency. Know the signs and symptoms of a heart attack. Below are things women can do to reduce their risk for heart disease.   Do not smoke. If you smoke, quit.  Aim for a healthy weight. Being overweight causes many preventable deaths. Eat a healthy and balanced diet and drink an adequate amount of liquids.  Get moving. Make a commitment to be more physically active. Aim for 30 minutes of activity on most, if not all days of the week.  Eat for heart health. Choose a diet that is low in saturated fat and cholesterol and eliminate trans fat. Include whole grains, vegetables, and fruits. Read and understand the labels on food containers before buying.  Know your numbers. Ask your caregiver to check your blood pressure, cholesterol (total, HDL, LDL, triglycerides) and blood glucose. Work with your caregiver on improving your entire clinical picture.  High blood pressure. Limit or stop your table salt intake (try salt substitute and food seasonings). Avoid salty foods and drinks. Read labels on food containers before buying. Eating well and exercising can help control high blood pressure. STROKE  Stroke is a medical emergency. Stroke may be the result of a blood clot in a blood vessel in the brain or by a brain hemorrhage (bleeding). Know the signs and symptoms of a stroke. To lower the risk of developing a stroke:  Avoid fatty foods.  Quit smoking.  Control your diabetes, blood pressure, and irregular heart rate. THROMBOPHLEBITIS  (BLOOD CLOT) OF THE LEG  Becoming overweight and leading a stationary lifestyle may also contribute to developing blood clots. Controlling your diet and exercising will help lower the risk of developing blood clots. CANCER SCREENING  Breast Cancer: Take steps to reduce your risk of breast cancer.  You should practice "breast self-awareness." This means understanding the normal appearance and feel of your breasts and should include breast self-examination. Any changes detected, no matter how small, should be reported to your caregiver.  After age 40, you should have a clinical breast exam (CBE) every year.  Starting at age 40, you should consider having a mammogram (breast X-ray) every year.  If you have a family history of breast cancer, talk to your caregiver about genetic screening.  If you are at high risk for breast cancer, talk to your caregiver about having an MRI and a mammogram every year.  Intestinal or Stomach Cancer: Tests to consider are a rectal exam, fecal occult blood, sigmoidoscopy, and colonoscopy. Women who are high risk may need to be screened at an earlier age and more often.  Cervical Cancer:  Beginning at age 30, you should have a Pap test every 3 years as long as the past 3 Pap tests have been normal.  If you have had past treatment for cervical cancer or a condition that could lead to cancer, you need Pap tests and screening for cancer for at least 20 years after your treatment.  If you had a hysterectomy for a problem that was not cancer or a condition that could lead to cancer, then you no longer need Pap tests.    If you are between ages 65 and 70, and you have had normal Pap tests going back 10 years, you no longer need Pap tests.  If Pap tests have been discontinued, risk factors (such as a new sexual partner) need to be reassessed to determine if screening should be resumed.  Some medical problems can increase the chance of getting cervical cancer. In these  cases, your caregiver may recommend more frequent screening and Pap tests.  Uterine Cancer: If you have vaginal bleeding after reaching menopause, you should notify your caregiver.  Ovarian cancer: Other than yearly pelvic exams, there are no reliable tests available to screen for ovarian cancer at this time except for yearly pelvic exams.  Lung Cancer: Yearly chest X-rays can detect lung cancer and should be done on high risk women, such as cigarette smokers and women with chronic lung disease (emphysema).  Skin Cancer: A complete body skin exam should be done at your yearly examination. Avoid overexposure to the sun and ultraviolet light lamps. Use a strong sun block cream when in the sun. All of these things are important in lowering the risk of skin cancer. MENOPAUSE Menopause Symptoms: Hormone therapy products are effective for treating symptoms associated with menopause:  Moderate to severe hot flashes.  Night sweats.  Mood swings.  Headaches.  Tiredness.  Loss of sex drive.  Insomnia.  Other symptoms. Hormone replacement carries certain risks, especially in older women. Women who use or are thinking about using estrogen or estrogen with progestin treatments should discuss that with their caregiver. Your caregiver will help you understand the benefits and risks. The ideal dose of hormone replacement therapy is not known. The Food and Drug Administration (FDA) has concluded that hormone therapy should be used only at the lowest doses and for the shortest amount of time to reach treatment goals.  OSTEOPOROSIS Protecting Against Bone Loss and Preventing Fracture: If you use hormone therapy for prevention of bone loss (osteoporosis), the risks for bone loss must outweigh the risk of the therapy. Ask your caregiver about other medications known to be safe and effective for preventing bone loss and fractures. To guard against bone loss or fractures, the following is recommended:  If  you are less than age 50, take 1000 mg of calcium and at least 600 mg of Vitamin D per day.  If you are greater than age 50 but less than age 70, take 1200 mg of calcium and at least 600 mg of Vitamin D per day.  If you are greater than age 70, take 1200 mg of calcium and at least 800 mg of Vitamin D per day. Smoking and excessive alcohol intake increases the risk of osteoporosis. Eat foods rich in calcium and vitamin D and do weight bearing exercises several times a week as your caregiver suggests. DIABETES Diabetes Melitus: If you have Type I or Type 2 diabetes, you should keep your blood sugar under control with diet, exercise and recommended medication. Avoid too many sweets, starchy and fatty foods. Being overweight can make control more difficult. COGNITION AND MEMORY Cognition and Memory: Menopausal hormone therapy is not recommended for the prevention of cognitive disorders such as Alzheimer's disease or memory loss.  DEPRESSION  Depression may occur at any age, but is common in elderly women. The reasons may be because of physical, medical, social (loneliness), or financial problems and needs. If you are experiencing depression because of medical problems and control of symptoms, talk to your caregiver about this. Physical activity and   exercise may help with mood and sleep. Community and volunteer involvement may help your sense of value and worth. If you have depression and you feel that the problem is getting worse or becoming severe, talk to your caregiver about treatment options that are best for you. ACCIDENTS  Accidents are common and can be serious in the elderly woman. Prepare your house to prevent accidents. Eliminate throw rugs, place hand bars in the bath, shower and toilet areas. Avoid wearing high heeled shoes or walking on wet, snowy, and icy areas. Limit or stop driving if you have vision or hearing problems, or you feel you are unsteady with you movements and  reflexes. HEPATITIS C Hepatitis C is a type of viral infection affecting the liver. It is spread mainly through contact with blood from an infected person. It can be treated, but if left untreated, it can lead to severe liver damage over years. Many people who are infected do not know that the virus is in their blood. If you are a "baby-boomer", it is recommended that you have one screening test for Hepatitis C. IMMUNIZATIONS  Several immunizations are important to consider having during your senior years, including:   Tetanus, diptheria, and pertussis booster shot.  Influenza every year before the flu season begins.  Pneumonia vaccine.  Shingles vaccine.  Others as indicated based on your specific needs. Talk to your caregiver about these. Document Released: 04/21/2005 Document Revised: 02/14/2012 Document Reviewed: 12/16/2007 [employer] Patient Information 2014 [employer].  

## 2013-05-15 NOTE — Progress Notes (Signed)
Shelley [name] Mar 07, 1956 [id number]    History:    Presents for annual exam.  Supracervical TAH with BSO for fibroid uterus, adenomyosis and endometriosis with bilateral ovarian endometriomas with adhesions.on no HRT. Normal Pap and mammogram history. Mother and maternal aunt breast cancer unknown BRCA status. Mother also with colon cancer/living. Normal DEXA scan at work. Primary care manages hypercholesteremia/reflux/hypertension.  Past medical history, past surgical history, family history and social history were all reviewed and documented in the EPIC chart. Dr. Phineas [name] delivered daughter. 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 Father heart disease.  ROS:  A  ROS was performed and pertinent positives and negatives are included.  Exam:  Filed Vitals:   05/15/13 1000  BP: 138/84    General appearance:  Normal Thyroid:  Symmetrical, normal in size, without palpable masses or nodularity. Respiratory  Auscultation:  Clear without wheezing or rhonchi Cardiovascular  Auscultation:  Regular rate, without rubs, murmurs or gallops  Edema/varicosities:  Not grossly evident Abdominal  Soft,nontender, without masses, guarding or rebound.  Liver/spleen:  No organomegaly noted  Hernia:  None appreciated  Skin  Inspection:  Grossly normal   Breasts: Examined lying and sitting.     Right: Without masses, retractions, discharge or axillary adenopathy.     Left: Without masses, retractions, discharge or axillary adenopathy. Gentitourinary   Inguinal/mons:  Normal without inguinal adenopathy  External genitalia:  Normal  BUS/Urethra/Skene's glands:  Normal  Vagina:  Normal  Cervix:  Normal  Uterus:  Absent  Adnexa/parametria:     Rt: Without masses or tenderness.   Lt: Without masses or tenderness.  Anus and perineum: Hemorrhoid  Digital rectal exam: Normal sphincter tone without palpated masses or tenderness  Assessment/Plan:  58 y.o. DWF G2P1 for annual exam with no  complaints.  Supracervical TAH with BSO on no HRT Hemorrhoid Hypercholesteremia/reflux/hypertension primary care manages labs and meds Normal DEXA per patient at work screening  Plan: Negative colonoscopy 2007 Dr. Sharlett [name], instructed to call in repeat due to family history. SBE's, continue annual 3-D tomography mammogram. Increase regular exercise and decrease calories for weight loss. Vitamin D 2000 daily encouraged. Pap normal 2014, new screening guidelines reviewed. Analpram-HC prescription, proper use, given and reviewed. Instructed to fax DEXA report.   Shelley [name] [facility], 10:41 AM 05/15/2013

## 2013-05-20 ENCOUNTER — Ambulatory Visit: Payer: BC Managed Care – PPO | Admitting: Physical Therapy

## 2013-05-27 ENCOUNTER — Ambulatory Visit: Payer: BC Managed Care – PPO | Admitting: Physical Therapy

## 2013-06-02 ENCOUNTER — Ambulatory Visit: Payer: BC Managed Care – PPO | Admitting: Physical Therapy

## 2013-06-09 ENCOUNTER — Ambulatory Visit: Payer: BC Managed Care – PPO | Admitting: Physical Therapy

## 2013-10-10 ENCOUNTER — Ambulatory Visit (INDEPENDENT_AMBULATORY_CARE_PROVIDER_SITE_OTHER): Payer: BC Managed Care – PPO | Admitting: Urology

## 2013-10-10 DIAGNOSIS — N949 Unspecified condition associated with female genital organs and menstrual cycle: Secondary | ICD-10-CM

## 2013-10-10 DIAGNOSIS — N281 Cyst of kidney, acquired: Secondary | ICD-10-CM

## 2013-10-14 ENCOUNTER — Other Ambulatory Visit: Payer: Self-pay | Admitting: Urology

## 2013-10-14 DIAGNOSIS — R3129 Other microscopic hematuria: Secondary | ICD-10-CM

## 2013-10-16 ENCOUNTER — Ambulatory Visit (HOSPITAL_COMMUNITY)
Admission: RE | Admit: 2013-10-16 | Discharge: 2013-10-16 | Disposition: A | Discharge status: Home / Self Care | Payer: BC Managed Care – PPO | Source: Ambulatory Visit | Attending: Urology | Admitting: Urology

## 2013-10-16 ENCOUNTER — Encounter (HOSPITAL_COMMUNITY): Payer: Self-pay

## 2013-10-16 DIAGNOSIS — R3129 Other microscopic hematuria: Secondary | ICD-10-CM | POA: Insufficient documentation

## 2013-10-16 DIAGNOSIS — Q619 Cystic kidney disease, unspecified: Secondary | ICD-10-CM | POA: Insufficient documentation

## 2013-10-16 DIAGNOSIS — K5730 Diverticulosis of large intestine without perforation or abscess without bleeding: Secondary | ICD-10-CM | POA: Insufficient documentation

## 2013-10-16 DIAGNOSIS — K7689 Other specified diseases of liver: Secondary | ICD-10-CM | POA: Insufficient documentation

## 2013-10-16 MED ORDER — IOHEXOL 300 MG/ML  SOLN
100.0000 mL | Freq: Once | INTRAMUSCULAR | Status: AC | PRN
  Administered 2013-10-16: 100 mL via INTRAVENOUS

## 2013-10-17 ENCOUNTER — Other Ambulatory Visit (HOSPITAL_COMMUNITY): Payer: BC Managed Care – PPO

## 2013-10-27 ENCOUNTER — Encounter (HOSPITAL_COMMUNITY): Payer: Self-pay | Admitting: Emergency Medicine

## 2013-10-27 ENCOUNTER — Emergency Department (HOSPITAL_COMMUNITY): Payer: BC Managed Care – PPO

## 2013-10-27 ENCOUNTER — Observation Stay (HOSPITAL_COMMUNITY)
Admission: EM | Admit: 2013-10-27 | Discharge: 2013-10-28 | Disposition: A | Discharge status: Home / Self Care | Payer: BC Managed Care – PPO | Attending: Internal Medicine | Admitting: Internal Medicine

## 2013-10-27 DIAGNOSIS — R0789 Other chest pain: Secondary | ICD-10-CM | POA: Diagnosis not present

## 2013-10-27 DIAGNOSIS — I10 Essential (primary) hypertension: Secondary | ICD-10-CM | POA: Diagnosis not present

## 2013-10-27 DIAGNOSIS — I059 Rheumatic mitral valve disease, unspecified: Secondary | ICD-10-CM | POA: Diagnosis not present

## 2013-10-27 DIAGNOSIS — R110 Nausea: Secondary | ICD-10-CM | POA: Insufficient documentation

## 2013-10-27 DIAGNOSIS — R072 Precordial pain: Secondary | ICD-10-CM

## 2013-10-27 DIAGNOSIS — Z880 Allergy status to penicillin: Secondary | ICD-10-CM | POA: Diagnosis not present

## 2013-10-27 DIAGNOSIS — Z7982 Long term (current) use of aspirin: Secondary | ICD-10-CM | POA: Insufficient documentation

## 2013-10-27 DIAGNOSIS — R209 Unspecified disturbances of skin sensation: Secondary | ICD-10-CM | POA: Diagnosis not present

## 2013-10-27 DIAGNOSIS — M5030 Other cervical disc degeneration, unspecified cervical region: Secondary | ICD-10-CM | POA: Insufficient documentation

## 2013-10-27 DIAGNOSIS — R42 Dizziness and giddiness: Secondary | ICD-10-CM | POA: Insufficient documentation

## 2013-10-27 DIAGNOSIS — R002 Palpitations: Secondary | ICD-10-CM | POA: Insufficient documentation

## 2013-10-27 DIAGNOSIS — Z87891 Personal history of nicotine dependence: Secondary | ICD-10-CM | POA: Diagnosis not present

## 2013-10-27 DIAGNOSIS — Z888 Allergy status to other drugs, medicaments and biological substances status: Secondary | ICD-10-CM | POA: Insufficient documentation

## 2013-10-27 DIAGNOSIS — E785 Hyperlipidemia, unspecified: Secondary | ICD-10-CM

## 2013-10-27 DIAGNOSIS — M542 Cervicalgia: Secondary | ICD-10-CM | POA: Diagnosis present

## 2013-10-27 DIAGNOSIS — K219 Gastro-esophageal reflux disease without esophagitis: Secondary | ICD-10-CM

## 2013-10-27 DIAGNOSIS — I080 Rheumatic disorders of both mitral and aortic valves: Secondary | ICD-10-CM

## 2013-10-27 DIAGNOSIS — Z79899 Other long term (current) drug therapy: Secondary | ICD-10-CM | POA: Insufficient documentation

## 2013-10-27 DIAGNOSIS — R079 Chest pain, unspecified: Secondary | ICD-10-CM

## 2013-10-27 LAB — CBC WITH DIFFERENTIAL/PLATELET
BASOS PCT: 0 % (ref 0–1)
Basophils Absolute: 0 10*3/uL (ref 0.0–0.1)
EOS ABS: 0.1 10*3/uL (ref 0.0–0.7)
Eosinophils Relative: 2 % (ref 0–5)
HCT: 38.7 % (ref 36.0–46.0)
HEMOGLOBIN: 13.3 g/dL (ref 12.0–15.0)
LYMPHS ABS: 1.7 10*3/uL (ref 0.7–4.0)
Lymphocytes Relative: 26 % (ref 12–46)
MCH: 30.8 pg (ref 26.0–34.0)
MCHC: 34.4 g/dL (ref 30.0–36.0)
MCV: 89.6 fL (ref 78.0–100.0)
Monocytes Absolute: 0.5 10*3/uL (ref 0.1–1.0)
Monocytes Relative: 8 % (ref 3–12)
NEUTROS PCT: 64 % (ref 43–77)
Neutro Abs: 4.2 10*3/uL (ref 1.7–7.7)
PLATELETS: 329 10*3/uL (ref 150–400)
RBC: 4.32 MIL/uL (ref 3.87–5.11)
RDW: 12.7 % (ref 11.5–15.5)
WBC: 6.5 10*3/uL (ref 4.0–10.5)

## 2013-10-27 LAB — BASIC METABOLIC PANEL
Anion gap: 14 (ref 5–15)
BUN: 12 mg/dL (ref 6–23)
CHLORIDE: 94 meq/L — AB (ref 96–112)
CO2: 27 mEq/L (ref 19–32)
Calcium: 9.8 mg/dL (ref 8.4–10.5)
Creatinine, Ser: 0.63 mg/dL (ref 0.50–1.10)
Glucose, Bld: 113 mg/dL — ABNORMAL HIGH (ref 70–99)
POTASSIUM: 5.2 meq/L (ref 3.7–5.3)
Sodium: 135 mEq/L — ABNORMAL LOW (ref 137–147)

## 2013-10-27 LAB — TROPONIN I

## 2013-10-27 MED ORDER — ASPIRIN 81 MG PO CHEW
324.0000 mg | CHEWABLE_TABLET | Freq: Once | ORAL | Status: AC
  Administered 2013-10-27: 324 mg via ORAL
  Filled 2013-10-27: qty 4

## 2013-10-27 MED ORDER — NITROGLYCERIN 0.4 MG SL SUBL
0.4000 mg | SUBLINGUAL_TABLET | SUBLINGUAL | Status: DC | PRN
  Administered 2013-10-27: 0.4 mg via SUBLINGUAL
  Filled 2013-10-27: qty 1

## 2013-10-27 NOTE — ED Notes (Signed)
Having pain to left neck and left arm about 45 minutes ago.  Had nausea when pain first started.  Have not taken any medication for pain or nausea.

## 2013-10-27 NOTE — ED Provider Notes (Signed)
TIME SEEN: 8:12 PM  CHIEF COMPLAINT: Chest pain, neck pain, lightheadedness, nausea  HPI: Patient is a 58 y.o. F with history of mitral regurgitation, hyperlipidemia, hypertension, prior tobacco use, significant family history of CAD who presents to the emergency department with complaints of left-sided chest tightness, left neck pain and tingling in her left arm that started prior to arrival. She states that she felt clammy, nauseous, lightheaded when this started. No shortness of breath. It is not worse with movement of her neck or palpation of her neck or her chest. No weakness of the arm. No diaphoresis but she states she did felt very clammy. She states her last stress test was over 5 years ago.   She is followed by Dr. Verl Blalock with cardiology. PCP is Dr. Gerarda Fraction  ROS: See HPI Constitutional: no fever  Eyes: no drainage  ENT: no runny nose   Cardiovascular:  chest pain  Resp: no SOB  GI: no vomiting GU: no dysuria Integumentary: no rash  Allergy: no hives  Musculoskeletal: no leg swelling  Neurological: no slurred speech ROS otherwise negative  PAST MEDICAL HISTORY/PAST SURGICAL HISTORY:  Past Medical History  Diagnosis Date  . Heart palpitations     Hx   . Mitral regurgitation   . Hyperlipidemia     high triglyerides - family history  . Hypertension   . GERD (gastroesophageal reflux disease)   . Disc disease, degenerative, cervical     MEDICATIONS:  Prior to Admission medications   Medication Sig Start Date End Date Taking? Authorizing Provider  AMITRIPTYLINE HCL PO Take 12.5 mg by mouth daily.    Historical Provider, MD  aspirin 81 MG tablet Take 81 mg by mouth daily.    Historical Provider, MD  BISOPROLOL FUMARATE PO Take 5 mg by mouth daily. Half tablet once daily    Historical Provider, MD  Cholecalciferol (VITAMIN D-3) 5000 UNITS TABS Take by mouth daily.    Historical Provider, MD  conjugated estrogens (PREMARIN) vaginal cream Place 1 Applicatorful vaginally daily.     Historical Provider, MD  diltiazem (CARTIA XT) 120 MG 24 hr capsule Take 120 mg by mouth daily. 1 tab once daily    Historical Provider, MD  hydrocortisone-pramoxine [facility]) 2.5-1 % rectal cream Place 1 application rectally 3 (three) times daily. 05/15/13   [name], NP  lansoprazole (PREVACID) 15 MG capsule Take 15 mg by mouth daily.    Historical Provider, MD  metaxalone (SKELAXIN) 800 MG tablet as needed. 10/20/11   Historical Provider, MD  montelukast (SINGULAIR) 10 MG tablet Take 10 mg by mouth at bedtime.  11/10/11   Historical Provider, MD  Multiple Vitamin (MULTIVITAMIN) tablet Take 1 tablet by mouth daily.    Historical Provider, MD  rosuvastatin (CRESTOR) 5 MG tablet Take 10 mg by mouth daily.     Historical Provider, MD    ALLERGIES:  Allergies  Allergen Reactions  . Codeine   . Morphine   . Penicillins   . Predicort [Prednisolone]     SOCIAL HISTORY:  History  Substance Use Topics  . Smoking status: Former Smoker    Types: Cigarettes  . Smokeless tobacco: Never Used  . Alcohol Use: No    FAMILY HISTORY: Family History  Problem Relation Age of Onset  . Hyperlipidemia Mother   . Hypertension Mother   . Breast cancer Mother 33  . Cancer Mother 5    colon   . Hyperlipidemia Father     history of coronary artery by  .  Breast cancer Paternal Aunt     after age 64  . Breast cancer Paternal Aunt     after age 30    EXAM: BP 170/80  Pulse 72  Temp(Src) 98.7 F (37.1 C) (Oral)  Resp 20  Ht 5\' 6"  (1.676 m)  Wt 169 lb (76.658 kg)  BMI 27.29 kg/m2  SpO2 100% CONSTITUTIONAL: Alert and oriented and responds appropriately to questions. Well-appearing; well-nourished HEAD: Normocephalic EYES: Conjunctivae clear, PERRL ENT: normal nose; no rhinorrhea; moist mucous membranes; pharynx without lesions noted NECK: Supple, no meningismus, no LAD; no midline spinal tenderness, no tenderness over her paraspinal musculature CARD: RRR; S1 and S2 appreciated; no  murmurs, no clicks, no rubs, no gallops RESP: Normal chest excursion without splinting or tachypnea; breath sounds clear and equal bilaterally; no wheezes, no rhonchi, no rales, chest wall is nontender to palpation ABD/GI: Normal bowel sounds; non-distended; soft, non-tender, no rebound, no guarding BACK:  The back appears normal and is non-tender to palpation, there is no CVA tenderness EXT: Normal ROM in all joints; non-tender to palpation; no edema; normal capillary refill; no cyanosis    SKIN: Normal color for age and race; warm NEURO: Moves all extremities equally, strength 5/5 in all 4 extremities, 2+ deep tendon reflexes in bilateral upper and lower extremities, sensation to light touch intact diffusely PSYCH: The patient's mood and manner are appropriate. Grooming and personal hygiene are appropriate.  MEDICAL DECISION MAKING: Patient here with chest pain, neck pain and left arm tingling, nausea and lightheadedness. She has multiple risk factors for ACS including hyperlipidemia, hypertension, prior tobacco use and family history of CAD. We'll give aspirin, nitroglycerin to get patient pain-free. We'll obtain labs, EKG, chest x-ray. I feel she needs admission for ACS rule out.  ED PROGRESS: Patient's labs are unremarkable. Troponin negative. Chest x-ray clear. Chest pain is completely resolved after 3 nitroglycerin tablets. Discussed with Dr. Nehemiah Settle for admission for CP rule out.     EKG Interpretation  Date/Time:  Monday October 27 2013 19:56:42 EDT Ventricular Rate:  71 PR Interval:  132 QRS Duration: 70 QT Interval:  400 QTC Calculation: 434 R Axis:   37 Text Interpretation:  Normal sinus rhythm Normal ECG Confirmed by WARD,  DO, KRISTEN ([id number]) on 10/27/2013 8:47:34 PM        [place], DO 10/27/13 2319

## 2013-10-28 ENCOUNTER — Encounter (HOSPITAL_COMMUNITY): Payer: Self-pay | Admitting: *Deleted

## 2013-10-28 DIAGNOSIS — R079 Chest pain, unspecified: Secondary | ICD-10-CM

## 2013-10-28 DIAGNOSIS — K219 Gastro-esophageal reflux disease without esophagitis: Secondary | ICD-10-CM

## 2013-10-28 DIAGNOSIS — R0789 Other chest pain: Secondary | ICD-10-CM | POA: Diagnosis present

## 2013-10-28 DIAGNOSIS — E785 Hyperlipidemia, unspecified: Secondary | ICD-10-CM

## 2013-10-28 DIAGNOSIS — I080 Rheumatic disorders of both mitral and aortic valves: Secondary | ICD-10-CM

## 2013-10-28 DIAGNOSIS — I10 Essential (primary) hypertension: Secondary | ICD-10-CM

## 2013-10-28 DIAGNOSIS — R072 Precordial pain: Secondary | ICD-10-CM

## 2013-10-28 LAB — BASIC METABOLIC PANEL
Anion gap: 14 (ref 5–15)
BUN: 10 mg/dL (ref 6–23)
CO2: 26 mEq/L (ref 19–32)
Calcium: 9.7 mg/dL (ref 8.4–10.5)
Chloride: 94 mEq/L — ABNORMAL LOW (ref 96–112)
Creatinine, Ser: 0.6 mg/dL (ref 0.50–1.10)
GFR calc Af Amer: >90 mL/min (ref >90)
GFR calc non Af Amer: >90 mL/min (ref >90)
Glucose, Bld: 139 mg/dL — ABNORMAL HIGH (ref 70–99)
POTASSIUM: 5.2 meq/L (ref 3.7–5.3)
SODIUM: 134 meq/L — AB (ref 137–147)

## 2013-10-28 LAB — TROPONIN I
Troponin I: <0.3 ng/mL (ref <0.30)
Troponin I: <0.3 ng/mL (ref <0.30)

## 2013-10-28 LAB — CBC
HEMATOCRIT: 38.8 % (ref 36.0–46.0)
Hemoglobin: 13.4 g/dL (ref 12.0–15.0)
MCH: 30.7 pg (ref 26.0–34.0)
MCHC: 34.5 g/dL (ref 30.0–36.0)
MCV: 89 fL (ref 78.0–100.0)
Platelets: 335 10*3/uL (ref 150–400)
RBC: 4.36 MIL/uL (ref 3.87–5.11)
RDW: 12.6 % (ref 11.5–15.5)
WBC: 6.4 10*3/uL (ref 4.0–10.5)

## 2013-10-28 MED ORDER — PANTOPRAZOLE SODIUM 40 MG PO TBEC
40.0000 mg | DELAYED_RELEASE_TABLET | Freq: Every day | ORAL | Status: DC
  Filled 2013-10-28: qty 1

## 2013-10-28 MED ORDER — SODIUM CHLORIDE 0.9 % IJ SOLN
3.0000 mL | Freq: Two times a day (BID) | INTRAMUSCULAR | Status: DC
  Administered 2013-10-28 (×2): 3 mL via INTRAVENOUS

## 2013-10-28 MED ORDER — MELOXICAM 7.5 MG PO TABS
3.7500 mg | ORAL_TABLET | Freq: Two times a day (BID) | ORAL | Status: DC
  Filled 2013-10-28 (×3): qty 0.5

## 2013-10-28 MED ORDER — ATORVASTATIN CALCIUM 40 MG PO TABS: 80.0000 mg | ORAL_TABLET | Freq: Every day | ORAL | Status: DC

## 2013-10-28 MED ORDER — AMITRIPTYLINE HCL 25 MG PO TABS: 12.5000 mg | ORAL_TABLET | Freq: Every day | ORAL | Status: DC

## 2013-10-28 MED ORDER — ALUM & MAG HYDROXIDE-SIMETH 200-200-20 MG/5ML PO SUSP: 30.0000 mL | Freq: Four times a day (QID) | ORAL | Status: DC | PRN

## 2013-10-28 MED ORDER — DILTIAZEM HCL ER COATED BEADS 120 MG PO CP24
120.0000 mg | ORAL_CAPSULE | Freq: Every morning | ORAL | Status: DC
  Filled 2013-10-28: qty 1

## 2013-10-28 MED ORDER — PANTOPRAZOLE SODIUM 20 MG PO TBEC: 20.0000 mg | DELAYED_RELEASE_TABLET | Freq: Every day | ORAL | Status: DC

## 2013-10-28 MED ORDER — ASPIRIN EC 81 MG PO TBEC: 81.0000 mg | DELAYED_RELEASE_TABLET | Freq: Every day | ORAL | Status: DC

## 2013-10-28 MED ORDER — METAXALONE 800 MG PO TABS: 400.0000 mg | ORAL_TABLET | Freq: Every day | ORAL | Status: DC | PRN

## 2013-10-28 MED ORDER — PANTOPRAZOLE SODIUM 40 MG PO TBEC: 40.0000 mg | DELAYED_RELEASE_TABLET | Freq: Two times a day (BID) | ORAL | Status: DC

## 2013-10-28 MED ORDER — ACETAMINOPHEN 325 MG PO TABS
650.0000 mg | ORAL_TABLET | Freq: Four times a day (QID) | ORAL | Status: DC | PRN
Start: 2013-10-28 — End: 2013-10-28

## 2013-10-28 MED ORDER — BISOPROLOL-HYDROCHLOROTHIAZIDE 5-6.25 MG PO TABS
0.5000 | ORAL_TABLET | Freq: Two times a day (BID) | ORAL | Status: DC
  Filled 2013-10-28 (×3): qty 0.5

## 2013-10-28 MED ORDER — ENOXAPARIN SODIUM 40 MG/0.4ML ~~LOC~~ SOLN
40.0000 mg | SUBCUTANEOUS | Status: DC
  Administered 2013-10-28: 40 mg via SUBCUTANEOUS
  Filled 2013-10-28: qty 0.4

## 2013-10-28 MED ORDER — DOCUSATE SODIUM 100 MG PO CAPS: 100.0000 mg | ORAL_CAPSULE | Freq: Every day | ORAL | Status: DC | PRN

## 2013-10-28 MED ORDER — MONTELUKAST SODIUM 10 MG PO TABS
10.0000 mg | ORAL_TABLET | Freq: Every morning | ORAL | Status: DC
Start: 2013-10-28 — End: 2013-10-28
  Administered 2013-10-28: 10 mg via ORAL
  Filled 2013-10-28: qty 1

## 2013-10-28 MED ORDER — FENOFIBRATE 160 MG PO TABS
160.0000 mg | ORAL_TABLET | Freq: Every morning | ORAL | Status: DC
  Administered 2013-10-28: 160 mg via ORAL
  Filled 2013-10-28 (×2): qty 1

## 2013-10-28 MED ORDER — ACETAMINOPHEN 650 MG RE SUPP: 650.0000 mg | Freq: Four times a day (QID) | RECTAL | Status: DC | PRN

## 2013-10-28 NOTE — Discharge Summary (Signed)
Physician Discharge Summary  Shelley [name]:[id number] DOB: 1956/02/02 DOA: 10/27/2013  PCP: Jana Half  Admit date: 10/27/2013 Discharge date: 10/28/2013  Time spent: 35 minutes  Recommendations for Outpatient Follow-up:  1. Please followup on patient's chest pain symptoms, could be secondary to gastroesophageal reflux disease, was discharged on Protonix 40 mg by mouth twice a day  Discharge Diagnoses:  Principal Problem:   Chest pain Active Problems:   HTN (hypertension)   HYPERLIPIDEMIA-MIXED   GERD   Discharge Condition: Stable  Diet recommendation: Heart healthy diet  Filed Weights   10/27/13 1953 10/28/13 0140  Weight: 76.658 kg (169 lb) 78 kg (171 lb 15.3 oz)    History of present illness:  Shelley [name] is a 58 y.o. female with a history of mitral valve regurgitation, chronic back pain, GERD, hyperlipidemia previous the hospital with onset of chest pain around 7:00. She was crossing the road from her parents house to her house when she developed a left-sided chest pain that later radiated into her jaw and left shoulder and down her arm. She has difficulty describing the type of pain. The pain persisted. She would oh and check her blood pressure which was in the 170s over 90s. She checked it 2 more times over the next 45 minutes. She then came to the hospital for evaluation. In the hospital she was given aspirin and nitroglycerin. Shortly after taking the nitroglycerin, her chest pain away. She denies diaphoresis, shortness of breath   Hospital Course:  Patient is a pleasant 58 year old female with a past medical history of hypertension, dyslipidemia, presented to the emergency department at [facility] on 10/28/2048 with complaints of retrosternal chest pain. Initial workup included an EKG which did not reveal ischemic changes. Chest x-ray did not reveal acute cardiopulmonary findings. She was placed in overnight observation, her troponins were  cycled and remained negative x3 sets. On the following morning she underwent a stress test which did not show evidence of reversible ischemia. She denied further episodes of chest pain. I suspect chest pain may have been secondary to gastroesophageal reflux disease, possibly as a consequence of NSAIDs  That she had been taking for chronic back pain. Patient was discharged on Protonix 40 mg by mouth twice a day. He was discharged to home in stable condition on 10/28/2013  Procedures:  Stress test performed on 10/28/2013. No evidence for reversible ischemia  Consultations:  Cardiology  Discharge Exam: Filed Vitals:   10/28/13 1003  BP: 120/80  Pulse:   Temp:   Resp:     General: Patient is chest pain-free, no acute distress, states feeling better Cardiovascular: Regular rate and rhythm normal S1-S2 no murmurs rubs or gallops Respiratory: Lungs are clear to auscultation bilaterally no wheezing rhonchi or rales Abdomen: Soft nontender nondistended  Discharge Instructions You were cared for by a hospitalist during your hospital stay. If you have any questions about your discharge medications or the care you received while you were in the hospital after you are discharged, you can call the unit and asked to speak with the hospitalist on call if the hospitalist that took care of you is not available. Once you are discharged, your primary care physician will handle any further medical issues. Please note that NO REFILLS for any discharge medications will be authorized once you are discharged, as it is imperative that you return to your primary care physician (or establish a relationship with a primary care physician if you do not have one) for your  aftercare needs so that they can reassess your need for medications and monitor your lab values.  Discharge Instructions   Call MD for:  difficulty breathing, headache or visual disturbances    Complete by:  As directed      Call MD for:  extreme  fatigue    Complete by:  As directed      Call MD for:  hives    Complete by:  As directed      Call MD for:  persistant dizziness or light-headedness    Complete by:  As directed      Call MD for:  persistant nausea and vomiting    Complete by:  As directed      Call MD for:  redness, tenderness, or signs of infection (pain, swelling, redness, odor or green/yellow discharge around incision site)    Complete by:  As directed      Call MD for:  severe uncontrolled pain    Complete by:  As directed      Call MD for:  temperature >100.4    Complete by:  As directed      Diet - low sodium heart healthy    Complete by:  As directed      Increase activity slowly    Complete by:  As directed             Medication List    STOP taking these medications       lansoprazole 15 MG capsule  Commonly known as:  PREVACID     lansoprazole 30 MG capsule  Commonly known as:  PREVACID  Replaced by:  pantoprazole 40 MG tablet      TAKE these medications       amitriptyline 25 MG tablet  Commonly known as:  ELAVIL  Take 12.5 mg by mouth at bedtime.     aspirin EC 81 MG tablet  Take 81 mg by mouth at bedtime.     bisoprolol-hydrochlorothiazide 5-6.25 MG per tablet  Commonly known as:  ZIAC  Take 0.5 tablets by mouth 2 (two) times daily.     CARTIA XT 120 MG 24 hr capsule  Generic drug:  diltiazem  Take 120 mg by mouth every morning.     fenofibrate 160 MG tablet  Take 160 mg by mouth every morning.     hydrocortisone-pramoxine 2.5-1 % rectal cream  Commonly known as:  ANALPRAM-HC  Place 1 application rectally 3 (three) times daily.     meloxicam 7.5 MG tablet  Commonly known as:  MOBIC  Take 3.75 mg by mouth 2 (two) times daily.     metaxalone 800 MG tablet  Commonly known as:  SKELAXIN  Take 400 mg by mouth daily as needed for muscle spasms.     montelukast 10 MG tablet  Commonly known as:  SINGULAIR  Take 10 mg by mouth every morning.     pantoprazole 40 MG tablet   Commonly known as:  PROTONIX  Take 1 tablet (40 mg total) by mouth 2 (two) times daily.     rosuvastatin 40 MG tablet  Commonly known as:  CRESTOR  Take 10 mg by mouth every evening.     Vitamin D-3 5000 UNITS Tabs  Take 1 tablet by mouth daily.       Allergies  Allergen Reactions  . Codeine Nausea And Vomiting  . Morphine Nausea And Vomiting  . Predicort [Prednisolone] Other (See Comments)    Cannot take orally, states that she can  take via injection   . Penicillins Swelling and Rash    Childhood reaction       Follow-up Information   Follow up with JACKSON,SAMANTHA, PA-C In 2 weeks.   Specialty:  Family Medicine   Contact information:   [address] [place] [address] [phone]        The results of significant diagnostics from this hospitalization (including imaging, microbiology, ancillary and laboratory) are listed below for reference.    Significant Diagnostic Studies: Ct Abdomen Pelvis W Wo Contrast  10/16/2013   CLINICAL DATA:  Micro hematuria. Followup indeterminate right renal lesion.  EXAM: CT ABDOMEN AND PELVIS WITHOUT AND WITH CONTRAST  TECHNIQUE: Multidetector CT imaging of the abdomen and pelvis was performed following the standard protocol before and following the bolus administration of intravenous contrast.  CONTRAST:  127mL OMNIPAQUE IOHEXOL 300 MG/ML  SOLN  COMPARISON:  MRI on 03/10/2011 and CT on 03/02/11  FINDINGS: Kidneys/Urinary Tract: No evidence of urolithiasis or hydronephrosis. 11 mm hyperdense subcapsular cyst seen in the lower pole of the right kidney which shows no evidence of contrast enhancement and is stable compared to prior exams. This is consistent with a Bosniak category 2 hemorrhagic cyst. Two other tiny sub-cm hemorrhagic cysts are also noted in the lower pole of the right kidney.  Liver: Severe hepatic steatosis again demonstrated. No liver masses are identified.  Gallbladder:  Unremarkable.  Pancreas: No mass, inflammatory  changes, or other parenchymal abnormality identified.  Spleen:  Within normal limits in size and appearance.  Adrenal Glands:  No mass identified.  Pelvic/Reproductive Organs: Prior hysterectomy noted. Adnexal regions are unremarkable in appearance.  Lymph Nodes:  No pathologically enlarged lymph nodes identified.  Bowel: Mild sigmoid diverticulosis is noted however there is no evidence of diverticulitis. Normal appendix visualized.  Vascular:  No evidence of abdominal aortic aneurysm.  Musculoskeletal:  No suspicious bone lesions identified.  Other:  None.  IMPRESSION: Stable 11 mm benign Bosniak category 2 hemorrhagic cyst in the lower pole of the right kidney. Two other tiny sub-cm hemorrhagic cysts also noted in the lower pole the right kidney.  Stable severe hepatic steatosis.  Diverticulosis. No radiographic evidence of diverticulitis.   Electronically Signed   By: Earle Gell M.D.   On: 10/16/2013 11:49   Dg Chest 2 View  10/27/2013   CLINICAL DATA:  Left-sided chest pain.  EXAM: CHEST  2 VIEW  COMPARISON:  04/01/2013.  FINDINGS: The cardiac silhouette, mediastinal and hilar contours are normal and stable. The lungs are clear. No pleural effusion. The bony thorax is intact.  IMPRESSION: No acute cardiopulmonary findings.   Electronically Signed   By: Kalman Jewels M.D.   On: 10/27/2013 21:14    Microbiology: No results found for this or any previous visit (from the past 240 hour(s)).   Labs: Basic Metabolic Panel:  Recent Labs Lab 10/27/13 2112 10/28/13 0253  NA 135* 134*  K 5.2 5.2  CL 94* 94*  CO2 27 26  GLUCOSE 113* 139*  BUN 12 10  CREATININE 0.63 0.60  CALCIUM 9.8 9.7   Liver Function Tests: No results found for this basename: AST, ALT, ALKPHOS, BILITOT, PROT, ALBUMIN,  in the last 168 hours No results found for this basename: LIPASE, AMYLASE,  in the last 168 hours No results found for this basename: AMMONIA,  in the last 168 hours CBC:  Recent Labs Lab 10/27/13 2112  10/28/13 0253  WBC 6.5 6.4  NEUTROABS 4.2  --   HGB  13.3 13.4  HCT 38.7 38.8  MCV 89.6 89.0  PLT 329 335   Cardiac Enzymes:  Recent Labs Lab 10/27/13 2112 10/28/13 0253 10/28/13 0825  TROPONINI <0.30 <0.30 <0.30   BNP: BNP (last 3 results) No results found for this basename: PROBNP,  in the last 8760 hours CBG: No results found for this basename: GLUCAP,  in the last 168 hours     Signed:  Kelvin Cellar  [facility] 10/28/2013, 1:08 PM

## 2013-10-28 NOTE — Care Management Note (Signed)
    Page 1 of 1   10/28/2013     2:00:19 PM CARE MANAGEMENT NOTE 10/28/2013  Patient:  Shelley [name], Shelley [name]   Account Number:  [id number]  Date Initiated:  10/28/2013  Documentation initiated by:  Jolene Provost  Subjective/Objective Assessment:   Patient admitted with chest pain. Patient from home. Pt independent with no HH services, DME's or medication needs.     Action/Plan:   Patient plans to discharge home today. patient has no CM needs at this time.   Anticipated DC Date:  10/28/2013   Anticipated DC Plan:  [place]  CM consult      Choice offered to / List presented to:             Status of service:  Completed, signed off Medicare Important Message given?   (If response is "NO", the following Medicare IM given date fields will be blank) Date Medicare IM given:   Medicare IM given by:   Date Additional Medicare IM given:   Additional Medicare IM given by:    Discharge Disposition:  HOME/SELF CARE  Per UR Regulation:    If discussed at Long Length of Stay Meetings, dates discussed:    Comments:  10/28/2013 [place], RN, MSN, [facility]

## 2013-10-28 NOTE — Consult Note (Signed)
Primary cardiologist: Previously Dr. Jenell Milliner Consulting cardiologist: Dr. Satira Sark  Clinical Summary Ms. Vinje is a 58 y.o.female with history outlined below, presently admitted with recent chest discomfort. She states that she has had recent episodes of a feeling of flushing that increases from her abdomen up to her head, sometimes followed by a feeling of numbness in her left arm and left neck. The most recent episode in question occurred after she had walked across the street from her parents house to her house, felt "bad" when she got inside her house and decided to sit down. She had the aforementioned symptoms and went back over to her parents house to check her blood pressure finding it to be elevated with systolic in the 967R which is unusual for her. Symptoms waxed and waned, she presented to the ER, states that she ultimately felt better after taking aspirin and nitroglycerin. She has had no further discomfort under observation.  She has no history of obstructive CAD. Remote ischemic testing via exercise Cardiolite from 2003 showed no evidence of ischemia with impaired exercise tolerance. Also reported history of mitral valve disease, apparently mild mitral regurgitation by prior echocardiography, result not available. She last saw Dr. Verl Blalock in November 2013.   Allergies  Allergen Reactions  . Codeine Nausea And Vomiting  . Morphine Nausea And Vomiting  . Predicort [Prednisolone] Other (See Comments)    Cannot take orally, states that she can take via injection   . Penicillins Swelling and Rash    Childhood reaction    Medications Scheduled Medications: . amitriptyline  12.5 mg Oral QHS  . aspirin EC  81 mg Oral QHS  . atorvastatin  80 mg Oral q1800  . bisoprolol-hydrochlorothiazide  0.5 tablet Oral BID  . diltiazem  120 mg Oral q morning - 10a  . enoxaparin (LOVENOX) injection  40 mg Subcutaneous Q24H  . fenofibrate  160 mg Oral q morning - 10a  . meloxicam   3.75 mg Oral BID  . montelukast  10 mg Oral q morning - 10a  . pantoprazole  40 mg Oral Daily  . sodium chloride  3 mL Intravenous Q12H    PRN Medications: acetaminophen, acetaminophen, alum & mag hydroxide-simeth, docusate sodium, metaxalone, nitroGLYCERIN   Past Medical History  Diagnosis Date  . Heart palpitations   . Mitral regurgitation   . Hyperlipidemia     High triglyerides - family history  . Essential hypertension, benign   . GERD (gastroesophageal reflux disease)   . Disc disease, degenerative, cervical     Past Surgical History  Procedure Laterality Date  . Total abdominal hysterectomy w/ bilateral salpingoophorectomy    . Childbirth    . Abdominal hysterectomy      Family History  Problem Relation Age of Onset  . Hyperlipidemia Mother   . Hypertension Mother   . Breast cancer Mother 53  . Colon cancer Mother 78  . Hyperlipidemia Father     History of coronary artery by  . Breast cancer Paternal Aunt     After age 56  . Breast cancer Paternal Aunt     After age 69    Social History Ms. Heier reports that she has quit smoking. Her smoking use included Cigarettes. She smoked 0.00 packs per day. She has never used smokeless tobacco. Ms. Rinehimer reports that she does not drink alcohol.  Review of Systems Palpitations have been fairly well controlled on outpatient regimen including calcium channel blocker. No dizziness or syncope. Reports  chronic arthritic pains in her back as well as knees. No falls. No orthopnea or PND. Other systems reviewed and negative except as outlined.  Physical Examination Blood pressure 135/76, pulse 64, temperature 99 F (37.2 C), temperature source Oral, resp. rate 16, height 5\' 5"  (1.651 m), weight 171 lb 15.3 oz (78 kg), SpO2 99.00%. No intake or output data in the 24 hours ending 10/28/13 0941  Telemetry: Normal sinus rhythm.  Overweight woman, no distress. HEENT: Conjunctiva and lids normal, oropharynx clear. Neck:  Supple, no elevated JVP or carotid bruits, no thyromegaly. Lungs: Clear to auscultation, nonlabored breathing at rest. Cardiac: Regular rate and rhythm, no S3 very soft systolic murmur, no pericardial rub. Abdomen: Soft, nontender, bowel sounds present, no guarding or rebound. Extremities: No pitting edema, distal pulses 2+. Skin: Warm and dry. Musculoskeletal: No kyphosis. Neuropsychiatric: Alert and oriented x3, affect grossly appropriate.   Lab Results  Basic Metabolic Panel:  Recent Labs Lab 10/27/13 2112 10/28/13 0253  NA 135* 134*  K 5.2 5.2  CL 94* 94*  CO2 27 26  GLUCOSE 113* 139*  BUN 12 10  CREATININE 0.63 0.60  CALCIUM 9.8 9.7    CBC:  Recent Labs Lab 10/27/13 2112 10/28/13 0253  WBC 6.5 6.4  NEUTROABS 4.2  --   HGB 13.3 13.4  HCT 38.7 38.8  MCV 89.6 89.0  PLT 329 335    Cardiac Enzymes:  Recent Labs Lab 10/27/13 2112 10/28/13 0253 10/28/13 0825  TROPONINI <0.30 <0.30 <0.30     ECG Normal sinus rhythm.  Imaging EXAM: CHEST 2 VIEW  COMPARISON: 04/01/2013.  FINDINGS: The cardiac silhouette, mediastinal and hilar contours are normal and stable. The lungs are clear. No pleural effusion. The bony thorax is intact.  IMPRESSION: No acute cardiopulmonary findings.   Impression  1. Recent chest pain symptoms as outlined, typical and atypical features for angina. She is currently symptom-free, cardiac markers argue against ACS, ECG shows no significant ST segment changes. No arrhythmias by telemetry. She has had no ischemic testing for greater than 10 years, has been kept n.p.o. for followup evaluation today.  2. History of palpitations, fairly well controlled on outpatient calcium channel blocker.  3. Reported history of mitral valve disease, mild mitral regurgitation based on Dr. Winnifred Friar last note from 2013. No recent echocardiogram.   Recommendations  Discussed with patient. We will proceed with a GXT this morning for followup  ischemic evaluation. If this is low risk, she can likely be discharged home later today. She would like to reestablish followup in the cardiology clinic and this can be arranged as well. Will probably need a followup outpatient echocardiogram eventually to reassess her mitral valve, although no significant murmur noted on examination.  Satira Sark, M.D., F.A.C.C.

## 2013-10-28 NOTE — H&P (Signed)
History and Physical  Shelley [name] RCB:[id number] DOB: [birth year] DOA: 10/27/2013  Referring physician: Emergency department PCP: Jana Half   Chief Complaint: Chest pain  HPI: Shelley [name] is a 58 y.o. female with a history of mitral valve regurgitation, chronic back pain, GERD, hyperlipidemia previous the hospital with onset of chest pain around 7:00. She was crossing the road from her parents house to her house when she developed a left-sided chest pain that later radiated into her jaw and left shoulder and down her arm. She has difficulty describing the type of pain. The pain persisted. She would oh and check her blood pressure which was in the 170s over 90s. She checked it 2 more times over the next 45 minutes.  She then came to the hospital for evaluation. In the hospital she was given aspirin and nitroglycerin. Shortly after taking the nitroglycerin, her chest pain away. She denies diaphoresis, shortness of breath   Review of Systems:   Pt complains of back pain which has been chronic, headache, palpitations, mild nausea.  Pt denies any fevers, chills, abdominal pain, difficulty breathing, shortness of breath.  Review of systems are otherwise negative  Past Medical History  Diagnosis Date  . Heart palpitations     Hx   . Mitral regurgitation   . Hyperlipidemia     high triglyerides - family history  . Hypertension   . GERD (gastroesophageal reflux disease)   . Disc disease, degenerative, cervical    Past Surgical History  Procedure Laterality Date  . Total abdominal hysterectomy w/ bilateral salpingoophorectomy    . Childbirth    . Abdominal hysterectomy     Social History:  reports that she has quit smoking. Her smoking use included Cigarettes. She smoked 0.00 packs per day. She has never used smokeless tobacco. She reports that she does not drink alcohol or use illicit drugs. Patient lives at home & is able to participate in activities of daily living  without assistance  Allergies  Allergen Reactions  . Codeine Nausea And Vomiting  . Morphine Nausea And Vomiting  . Predicort [Prednisolone] Other (See Comments)    Cannot take orally, states that she can take via injection   . Penicillins Swelling and Rash    Childhood reaction    Family History  Problem Relation Age of Onset  . Hyperlipidemia Mother   . Hypertension Mother   . Breast cancer Mother 49  . Cancer Mother 18    colon   . Hyperlipidemia Father     history of coronary artery by  . Breast cancer Paternal Aunt     after age 48  . Breast cancer Paternal Aunt     after age 40      Prior to Admission medications   Medication Sig Start Date End Date Taking? Authorizing Provider  amitriptyline (ELAVIL) 25 MG tablet Take 12.5 mg by mouth at bedtime.   Yes Historical Provider, MD  aspirin EC 81 MG tablet Take 81 mg by mouth at bedtime.   Yes Historical Provider, MD  bisoprolol-hydrochlorothiazide (ZIAC) 5-6.25 MG per tablet Take 0.5 tablets by mouth 2 (two) times daily. 08/22/13  Yes Historical Provider, MD  Cholecalciferol (VITAMIN D-3) 5000 UNITS TABS Take 1 tablet by mouth daily.    Yes Historical Provider, MD  diltiazem (CARTIA XT) 120 MG 24 hr capsule Take 120 mg by mouth every morning.    Yes Historical Provider, MD  fenofibrate 160 MG tablet Take 160 mg by mouth every morning. 08/22/13  Yes Historical Provider, MD  lansoprazole (PREVACID) 15 MG capsule Take 15 mg by mouth every evening.    Yes Historical Provider, MD  lansoprazole (PREVACID) 30 MG capsule Take 30 mg by mouth every morning.   Yes Historical Provider, MD  meloxicam (MOBIC) 7.5 MG tablet Take 3.75 mg by mouth 2 (two) times daily.  10/24/13  Yes Historical Provider, MD  metaxalone (SKELAXIN) 800 MG tablet Take 400 mg by mouth daily as needed for muscle spasms.  10/20/11  Yes Historical Provider, MD  montelukast (SINGULAIR) 10 MG tablet Take 10 mg by mouth every morning.  11/10/11  Yes Historical Provider, MD    rosuvastatin (CRESTOR) 40 MG tablet Take 10 mg by mouth every evening.   Yes Historical Provider, MD  hydrocortisone-pramoxine [employer]) 2.5-1 % rectal cream Place 1 application rectally 3 (three) times daily. 05/15/13   [name], NP    Physical Exam: BP 143/85  Pulse 64  Temp(Src) 98.7 F (37.1 C) (Oral)  Resp 12  Ht 5\' 6"  (1.676 m)  Wt 76.658 kg (169 lb)  BMI 27.29 kg/m2  SpO2 99%  General:  64 Caucasian female who is awake alert and oriented x3. No acute cardiopulmonary distress. Eyes: Equal, round, reactive to light. Extraocular muscles are intact. Sclerae anicteric not injected. ENT: External auditory canals are patent and tympanic membranes were noted to clinically. Oropharynx is clear of mucosal lesions. Teeth in good repair. Neck: Supple without lymphadenopathy. No carotid bruits Cardiovascular: Regular rate with normal S1-S2 sounds. No murmurs auscultated. No jugular venous distention. Respiratory: Good respiratory effort. Lungs clear to auscultation bilaterally. No wheezes, rales, rhonchi. Abdomen: Soft, nontender, nondistended. Appropriate bowel sounds. No masses or hepatosplenomegaly palpated. Skin: Warm to touch with no rashes bruises or petechiae. Musculoskeletal: Joints are nonerythematous Psychiatric: Appropriate insight and judgment. Neurologic: Cranial nerves 2-12 grossly intact.           Labs on Admission:  Basic Metabolic Panel:  Recent Labs Lab 10/27/13 2112  NA 135*  K 5.2  CL 94*  CO2 27  GLUCOSE 113*  BUN 12  CREATININE 0.63  CALCIUM 9.8   Liver Function Tests: No results found for this basename: AST, ALT, ALKPHOS, BILITOT, PROT, ALBUMIN,  in the last 168 hours No results found for this basename: LIPASE, AMYLASE,  in the last 168 hours No results found for this basename: AMMONIA,  in the last 168 hours CBC:  Recent Labs Lab 10/27/13 2112  WBC 6.5  NEUTROABS 4.2  HGB 13.3  HCT 38.7  MCV 89.6  PLT 329   Cardiac  Enzymes:  Recent Labs Lab 10/27/13 2112  TROPONINI <0.30    BNP (last 3 results) No results found for this basename: PROBNP,  in the last 8760 hours CBG: No results found for this basename: GLUCAP,  in the last 168 hours  Radiological Exams on Admission: Dg Chest 2 View  10/27/2013   CLINICAL DATA:  Left-sided chest pain.  EXAM: CHEST  2 VIEW  COMPARISON:  04/01/2013.  FINDINGS: The cardiac silhouette, mediastinal and hilar contours are normal and stable. The lungs are clear. No pleural effusion. The bony thorax is intact.  IMPRESSION: No acute cardiopulmonary findings.   Electronically Signed   By: Kalman Jewels M.D.   On: 10/27/2013 21:14    EKG: Independently reviewed. Normal sinus rhythm with rate of 71. No ST elevation or depression. Normal intervals  Assessment/Plan Present on Admission:  . Chest pain  #1 chest pain Admit on telemetry for observation  Continue troponins Consult cardiology in the morning for stress testing We'll keep n.p.o. and hold beta blocker  #2 GERD Continue home medications  #3 back pain Continue medications  Consultants: Cardiology in the morning  Code Status: Full code  Family Communication: None   Disposition Plan: Home after stress testing  Time spent: 50 minutes  Loma Boston, DO [facility] Pager [phone]  **Disclaimer: This note may have been dictated with voice recognition software. Similar sounding words can inadvertently be transcribed and this note may contain transcription errors which may not have been corrected upon publication of note.**

## 2013-10-28 NOTE — CV Procedure (Signed)
Stress Lab Nurses Notes - Shelley [name]   Shelley [name]  10/28/2013  Reason for doing test: Chest Pain  Type of test: Regular GTX  Nurse performing test: Gerrit Halls, RN  MD performing test: Myles Gip  Test explained and consent signed: Yes.  IV started: Saline lock flushed and Saline lock from floor  Symptoms: Tired, foot pain  Treatment/Intervention: None  Reason test stopped: Reached target HR  After recovery IV was: No redness or edema and Saline Lock flushed  Patient discharged: Transported back to room 325 via Ambulatory  Patient's Condition upon discharge was: Stable  Comments: During procedure peak BP 184/89 HR142, Recovery BP 137/87 HR 83, Symptoms resolved  [name], Shelley  Attending note:  Patient exercised on Bruce protocol for 6:00 achieving maximum workload 7 METS. Peak heart rate 142 BPM which was 86% MPHR. Peak blood pressure 184/89. No chest pain reported. Somewhat limited by arthritic leg and foot pain. No diagnostic ST segment changes to indicate ischemia, no arrhythmias. Low risk Duke treadmill score of 6.  Satira Sark, M.D., F.A.C.C.

## 2013-10-28 NOTE — Procedures (Signed)
Stress Lab Nurses Notes - Shelley [name]  Shelley [name] 10/28/2013 Reason for doing test: Chest Pain Type of test: Regular GTX Nurse performing test: Gerrit Halls, RN Nuclear Medicine Tech: Not Applicable Echo Tech: Not Applicable MD performing test: Myles Gip Family MD:  Test explained and consent signed: Yes.   IV started: Saline lock flushed and Saline lock from floor Symptoms: tired, foot pain  Treatment/Intervention: None Reason test stopped: protocol completed and reached target HR After recovery IV was: No redness or edema and Saline Lock flushed Patient to return to [place]. Med at : Patient discharged: Transported back to room 325 via Ambulatory Patient's Condition upon discharge was: stable Comments: During procedure peak BP 184/89 HR142, Recovery BP 137/87 HR 83, Symptoms resolved [name], [name]

## 2013-10-28 NOTE — Progress Notes (Signed)
Pt refused her AM medications Protonix & Mobic this shift. Pt stated that Protonix gives her a headache so she takes Prevacid instead at home. MD notified. Beta blocker & Cardizem held this AM as ordered. BP-120/80, P-64. No c/o pain or discomfort noted at this time.

## 2013-10-28 NOTE — Progress Notes (Signed)
UR completed 

## 2013-10-28 NOTE — Progress Notes (Signed)
Patient and family received discharge instructions and had no further questions/concerns.  Patient is aware of follow-up appointments and will follow-up with her PCP.  Patient's IV was removed and was clean, dry, and intact at removal.  Patient requested to walk to car and was escorted out by nurse.

## 2013-11-04 IMAGING — CR DG CHEST 2V
2 series · 2 of 2 positions shown · non-contrast
Comparison: 08/19/2007.  CT 03/02/2011.

CLINICAL DATA: History of hypertension.  Coughing.  Recent
cessation of smoking.

CHEST - 2 VIEW

[view not recorded (1 of 2)]
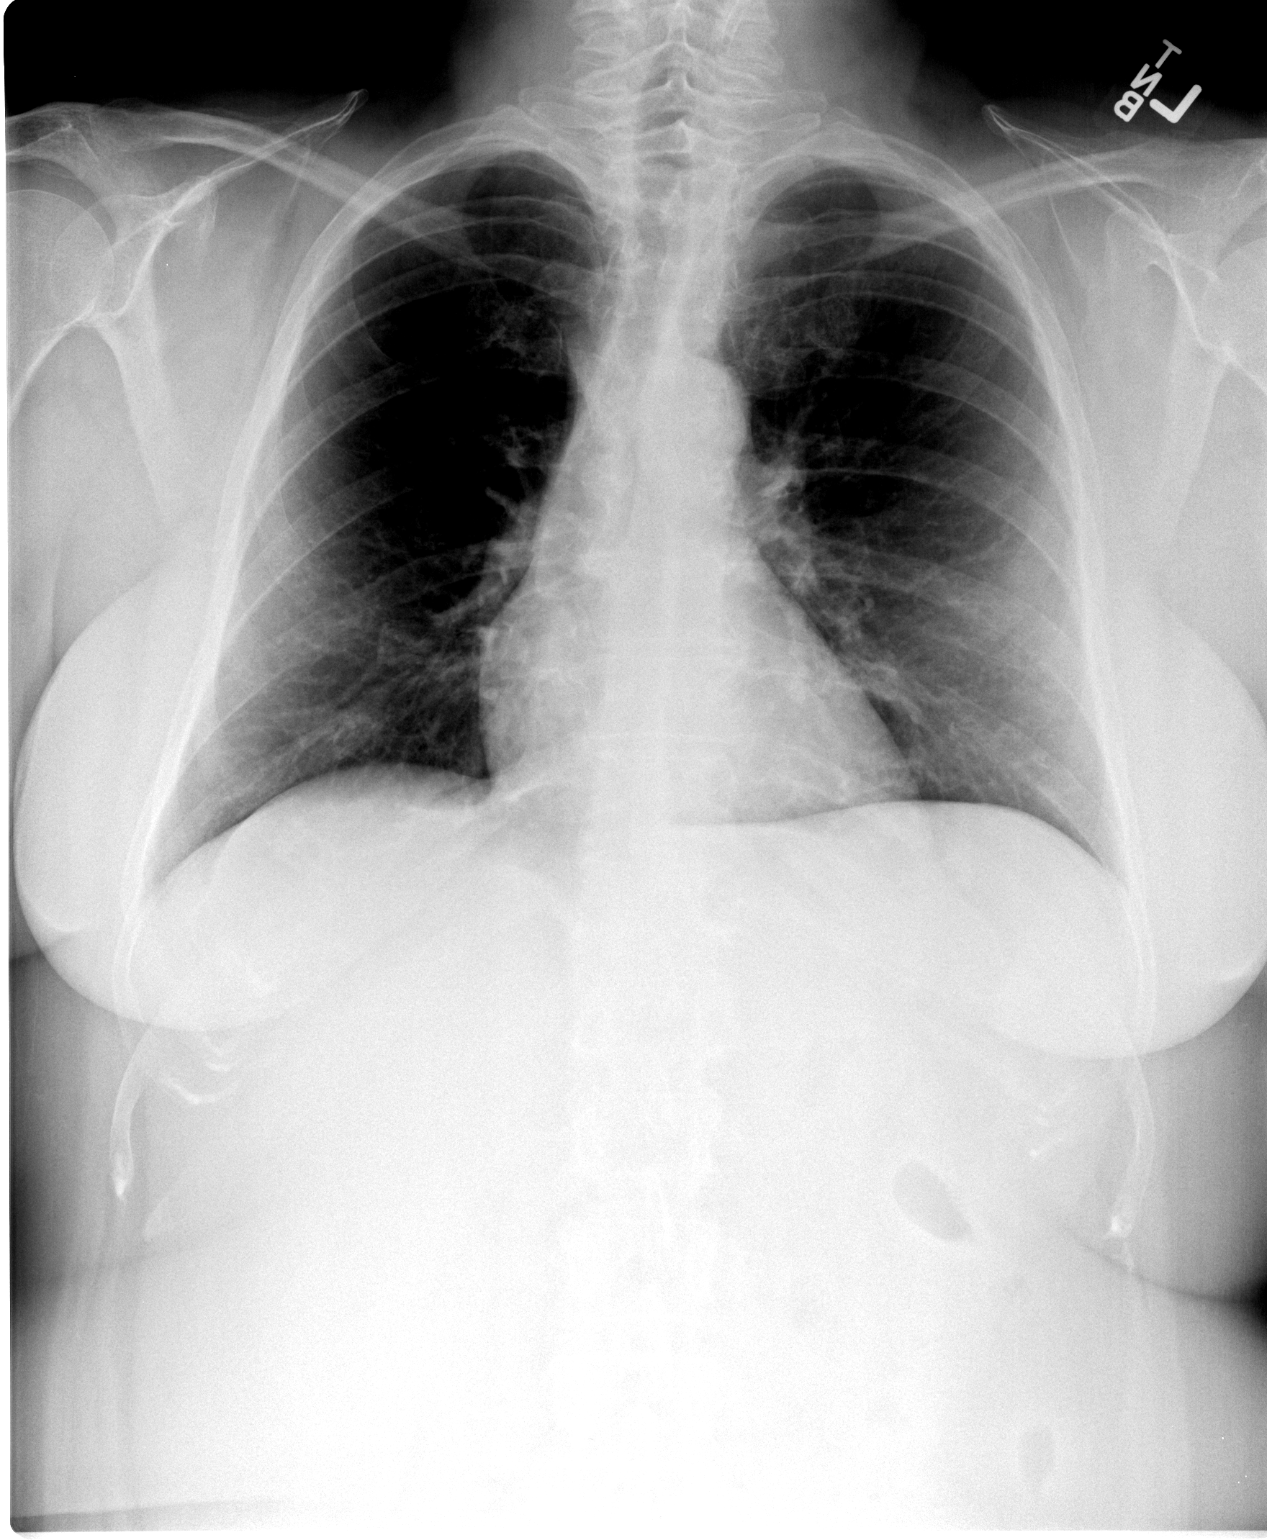

[view not recorded (2 of 2)]
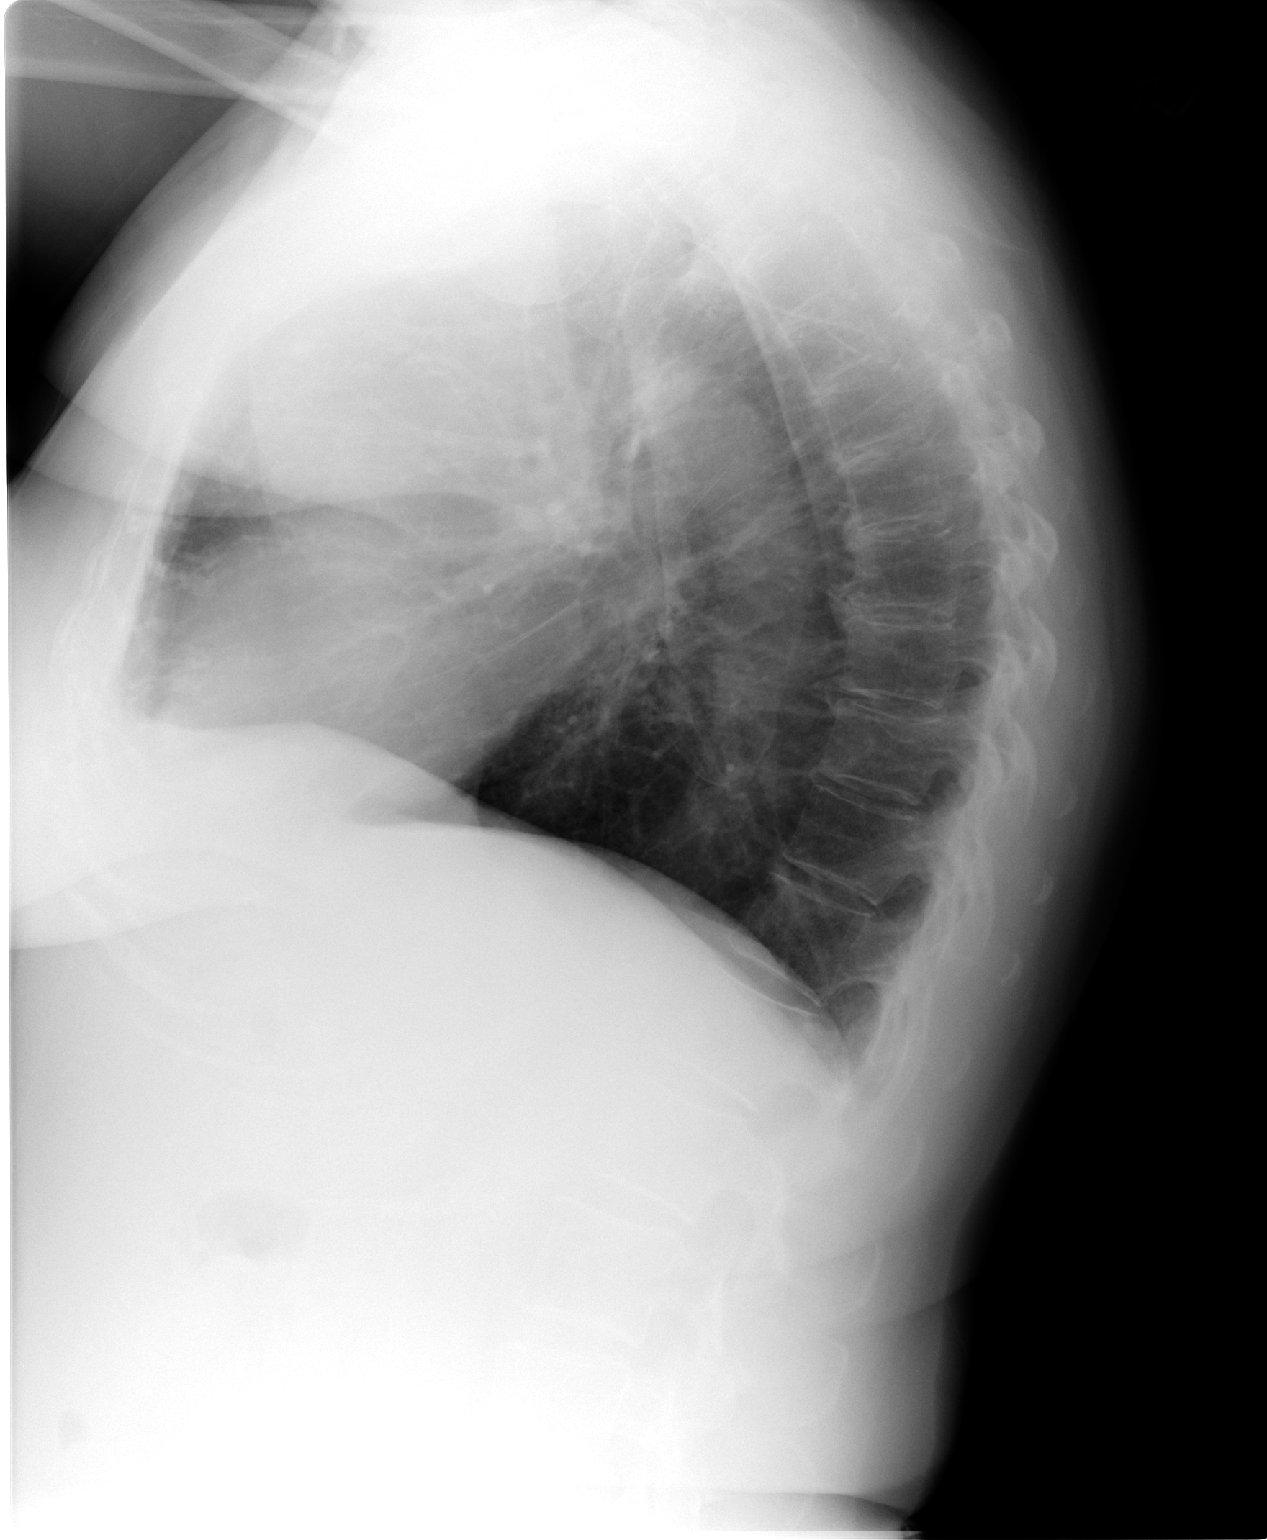

[2 of 2 positions shown; findings below may reference images not displayed]

FINDINGS: The cardiac silhouette is normal size and shape.
Mediastinal and hilar contours appear stable.  There is flattening
of diaphragm on lateral image with overall mild hyperinflation
configuration consistent with element of COPD.  No pulmonary edema,
pneumonia, or pleural effusion is seen.  Minimal central
peribronchial thickening is present.  There is slightly osteopenic
appearance of bones.  Changes of degenerative spondylosis are seen.
IMPRESSION: Flattening of diaphragm on lateral image with overall mild
hyperinflation configuration consistent with element of COPD.  No
pulmonary edema, pneumonia, or pleural effusion is seen.  Minimal
central peribronchial thickening is present. This may be associated
with bronchitis, asthma, and reactive airway disease.

## 2013-11-05 ENCOUNTER — Other Ambulatory Visit (HOSPITAL_COMMUNITY): Payer: Self-pay | Admitting: Internal Medicine

## 2013-11-05 DIAGNOSIS — M549 Dorsalgia, unspecified: Secondary | ICD-10-CM

## 2013-11-10 ENCOUNTER — Ambulatory Visit (HOSPITAL_COMMUNITY)
Admission: RE | Admit: 2013-11-10 | Discharge: 2013-11-10 | Disposition: A | Discharge status: Home / Self Care | Payer: BC Managed Care – PPO | Source: Ambulatory Visit | Attending: Internal Medicine | Admitting: Internal Medicine

## 2013-11-10 DIAGNOSIS — M545 Low back pain, unspecified: Secondary | ICD-10-CM | POA: Diagnosis present

## 2013-11-10 DIAGNOSIS — M5126 Other intervertebral disc displacement, lumbar region: Secondary | ICD-10-CM | POA: Insufficient documentation

## 2013-11-10 DIAGNOSIS — M549 Dorsalgia, unspecified: Secondary | ICD-10-CM

## 2013-11-25 ENCOUNTER — Encounter: Payer: Self-pay | Admitting: Cardiology

## 2013-11-25 ENCOUNTER — Ambulatory Visit (INDEPENDENT_AMBULATORY_CARE_PROVIDER_SITE_OTHER): Payer: BC Managed Care – PPO | Admitting: Cardiology

## 2013-11-25 VITALS — BP 130/82 | HR 76 | Ht 66.0 in | Wt 168.0 lb

## 2013-11-25 DIAGNOSIS — Z8679 Personal history of other diseases of the circulatory system: Secondary | ICD-10-CM

## 2013-11-25 DIAGNOSIS — I080 Rheumatic disorders of both mitral and aortic valves: Secondary | ICD-10-CM

## 2013-11-25 MED ORDER — DILTIAZEM HCL ER COATED BEADS 180 MG PO CP24: 180.0000 mg | ORAL_CAPSULE | Freq: Every day | ORAL | Status: DC

## 2013-11-25 MED ORDER — DILTIAZEM HCL ER COATED BEADS 120 MG PO CP24: 120.0000 mg | ORAL_CAPSULE | Freq: Every day | ORAL | Status: DC

## 2013-11-25 NOTE — Assessment & Plan Note (Signed)
Resolved. Recent GXT low risk.

## 2013-11-25 NOTE — Assessment & Plan Note (Signed)
Reports worsening sense of palpitations when her blood pressure is elevated. Will increase her Cartia XT to 180 mg daily for now. Prescription provided. Followup arranged in 3 months.

## 2013-11-25 NOTE — Assessment & Plan Note (Signed)
Relatively soft murmur on examination. Will consider followup echocardiogram at a later time. 

## 2013-11-25 NOTE — Progress Notes (Signed)
Clinical Summary Shelley [name] is a 58 y.o.female former patient of Dr. Verl Blalock seen most recently as in consult at [facility] in August with atypical chest pain. She ruled out for myocardial infarction by enzymes and underwent a GXT that showed no ischemic ST segment changes at a maximum workload of 7 METS, low risk Duke treadmill score of 6.  She is concerned that her blood pressure is trending up somewhat. She has been on the regimen outlined below for quite some time. She does admit that she has not been exercising as regularly due to "bulging discs" in her back.  Reported history of mitral valve disease based on previous notes, although details are not clear. No recent echocardiogram.   Allergies  Allergen Reactions  . Codeine Nausea And Vomiting  . Morphine Nausea And Vomiting  . Predicort [Prednisolone] Other (See Comments)    Cannot take orally, states that she can take via injection   . Penicillins Swelling and Rash    Childhood reaction    Current Outpatient Prescriptions  Medication Sig Dispense Refill  . amitriptyline (ELAVIL) 25 MG tablet Take 12.5 mg by mouth at bedtime.      Marland Kitchen aspirin EC 81 MG tablet Take 81 mg by mouth at bedtime.      . bisoprolol-hydrochlorothiazide (ZIAC) 5-6.25 MG per tablet Take 0.5 tablets by mouth 2 (two) times daily.      . fenofibrate 160 MG tablet Take 160 mg by mouth every morning.      . lansoprazole (PREVACID) 30 MG capsule Take 30 mg by mouth 2 (two) times daily before a meal. rx strength in am otc pm (45mg /daily)      . metaxalone (SKELAXIN) 800 MG tablet Take 400 mg by mouth daily as needed for muscle spasms.       . montelukast (SINGULAIR) 10 MG tablet Take 10 mg by mouth every morning.       . rosuvastatin (CRESTOR) 40 MG tablet Take 10 mg by mouth every evening.      . Vitamin D, Ergocalciferol, (DRISDOL) 50000 UNITS CAPS capsule Take 50,000 Units by mouth every 7 (seven) days.      Marland Kitchen diltiazem (CARDIZEM CD) 120 MG 24 hr capsule Take  1 capsule (120 mg total) by mouth daily.       No current facility-administered medications for this visit.    Past Medical History  Diagnosis Date  . Palpitations   . Mitral regurgitation   . Hyperlipidemia     High triglyerides - family history  . Essential hypertension, benign   . GERD (gastroesophageal reflux disease)   . Disc disease, degenerative, cervical     Social History Shelley [name] reports that she quit smoking about 3 years ago. Her smoking use included Cigarettes. She smoked 0.00 packs per day. She has never used smokeless tobacco. Shelley [name] reports that she does not drink alcohol.  Review of Systems Other systems reviewed and negative.  Physical Examination Filed Vitals:   11/25/13 0904  BP: 130/82  Pulse: 76   Filed Weights   11/25/13 0904  Weight: 168 lb (76.204 kg)    Overweight woman, no distress.  HEENT: Conjunctiva and lids normal, oropharynx clear.  Neck: Supple, no elevated JVP or carotid bruits, no thyromegaly.  Lungs: Clear to auscultation, nonlabored breathing at rest.  Cardiac: Regular rate and rhythm, no S3, 2/6 systolic murmur, no pericardial rub.  Abdomen: Soft, nontender, bowel sounds present, no guarding or rebound.  Extremities: No pitting edema,  distal pulses 2+.  Skin: Warm and dry.  Musculoskeletal: No kyphosis.  Neuropsychiatric: Alert and oriented x3, affect grossly appropriate.   Problem List and Plan   PALPITATIONS, HX OF Reports worsening sense of palpitations when her blood pressure is elevated. Will increase her Cartia XT to 180 mg daily for now. Prescription provided. Followup arranged in 3 months.  Atypical chest pain Resolved. Recent GXT low risk.  MITRAL REGURGITATION Relatively soft murmur on examination. Will consider followup echocardiogram at a later time.    Satira Sark, M.D., F.A.C.C.

## 2013-11-25 NOTE — Patient Instructions (Signed)
Your physician recommends that you schedule a follow-up appointment in: 3 months      Your physician has recommended you make the following change in your medication:     INCREASE Cartia XT to 180 mg daily      Thank you for choosing [facility] !

## 2014-01-12 ENCOUNTER — Encounter: Payer: Self-pay | Admitting: Cardiology

## 2014-01-21 ENCOUNTER — Other Ambulatory Visit (HOSPITAL_COMMUNITY): Payer: Self-pay | Admitting: Physician Assistant

## 2014-01-21 DIAGNOSIS — M5020 Other cervical disc displacement, unspecified cervical region: Secondary | ICD-10-CM

## 2014-01-21 DIAGNOSIS — M5412 Radiculopathy, cervical region: Secondary | ICD-10-CM

## 2014-01-26 ENCOUNTER — Ambulatory Visit (HOSPITAL_COMMUNITY)
Admission: RE | Admit: 2014-01-26 | Discharge: 2014-01-26 | Disposition: A | Discharge status: Home / Self Care | Payer: BC Managed Care – PPO | Source: Ambulatory Visit | Attending: Physician Assistant | Admitting: Physician Assistant

## 2014-01-26 ENCOUNTER — Ambulatory Visit (HOSPITAL_COMMUNITY): Payer: BC Managed Care – PPO

## 2014-01-26 DIAGNOSIS — M5020 Other cervical disc displacement, unspecified cervical region: Secondary | ICD-10-CM

## 2014-01-26 DIAGNOSIS — M79621 Pain in right upper arm: Secondary | ICD-10-CM | POA: Diagnosis not present

## 2014-01-26 DIAGNOSIS — M542 Cervicalgia: Secondary | ICD-10-CM | POA: Insufficient documentation

## 2014-01-26 DIAGNOSIS — M79622 Pain in left upper arm: Secondary | ICD-10-CM | POA: Diagnosis not present

## 2014-01-26 DIAGNOSIS — M5412 Radiculopathy, cervical region: Secondary | ICD-10-CM

## 2014-02-24 ENCOUNTER — Ambulatory Visit: Payer: BC Managed Care – PPO | Admitting: Cardiology

## 2014-03-23 ENCOUNTER — Encounter: Payer: Self-pay | Admitting: Cardiology

## 2014-03-23 ENCOUNTER — Ambulatory Visit (INDEPENDENT_AMBULATORY_CARE_PROVIDER_SITE_OTHER): Payer: BLUE CROSS/BLUE SHIELD | Admitting: Cardiology

## 2014-03-23 VITALS — BP 122/78 | HR 73 | Ht 66.0 in | Wt 177.0 lb

## 2014-03-23 DIAGNOSIS — R002 Palpitations: Secondary | ICD-10-CM

## 2014-03-23 DIAGNOSIS — I080 Rheumatic disorders of both mitral and aortic valves: Secondary | ICD-10-CM

## 2014-03-23 NOTE — Assessment & Plan Note (Signed)
Relatively soft murmur on examination. Will consider followup echocardiogram at a later time.

## 2014-03-23 NOTE — Assessment & Plan Note (Signed)
This has been better controlled on Cardia XT 180 mg daily. Also reports that blood pressure has been more stable. No changes made today. Continue observation.

## 2014-03-23 NOTE — Progress Notes (Signed)
Reason for visit:  Follow-up palpitations  Clinical Summary Ms. Micheletti is a 59 y.o.female last seen in September 2015.  She presents for a routine follow-up visit. States that blood pressure has been generally better controlled when she has it checked at work. She has tolerated the higher dose Cartia XT 180 mg daily. Also no further palpitations or chest pain. Still very busy, works 2 jobs.   Allergies  Allergen Reactions  . Codeine Nausea And Vomiting  . Morphine Nausea And Vomiting  . Predicort [Prednisolone] Other (See Comments)    Cannot take orally, states that she can take via injection   . Penicillins Swelling and Rash    Childhood reaction    Current Outpatient Prescriptions  Medication Sig Dispense Refill  . amitriptyline (ELAVIL) 25 MG tablet Take 12.5 mg by mouth at bedtime.    Marland Kitchen aspirin EC 81 MG tablet Take 81 mg by mouth at bedtime.    . bisoprolol-hydrochlorothiazide (ZIAC) 5-6.25 MG per tablet Take 0.5 tablets by mouth 2 (two) times daily.    Marland Kitchen diltiazem (CARTIA XT) 180 MG 24 hr capsule Take 1 capsule (180 mg total) by mouth daily. 90 capsule 3  . fenofibrate 160 MG tablet Take 160 mg by mouth every morning.    . lansoprazole (PREVACID) 30 MG capsule Take 30 mg by mouth 2 (two) times daily before a meal. rx strength in am otc pm (45mg /daily)    . metaxalone (SKELAXIN) 800 MG tablet Take 400 mg by mouth daily as needed for muscle spasms.     . montelukast (SINGULAIR) 10 MG tablet Take 10 mg by mouth every morning.     . rosuvastatin (CRESTOR) 40 MG tablet Take 10 mg by mouth every evening.    . Vitamin D, Ergocalciferol, (DRISDOL) 50000 UNITS CAPS capsule Take 50,000 Units by mouth every 7 (seven) days.     No current facility-administered medications for this visit.    Past Medical History  Diagnosis Date  . Palpitations   . Mitral regurgitation   . Hyperlipidemia     High triglyerides - family history  . Essential hypertension, benign   . GERD  (gastroesophageal reflux disease)   . Disc disease, degenerative, cervical     Social History Ms. Burtch reports that she quit smoking about 3 years ago. Her smoking use included Cigarettes. She smoked 0.00 packs per day. She has never used smokeless tobacco. Ms. Clair reports that she does not drink alcohol.  Review of Systems Complete review of systems negative except as otherwise outlined in the clinical summary and also the following.  No dizziness or syncope. Chronic problems with back pain. She reports "bulging discs" in her cervical region, has been having physical therapy for this.  Physical Examination Filed Vitals:   03/23/14 1630  BP: 122/78  Pulse: 73   Filed Weights   03/23/14 1630  Weight: 177 lb (80.287 kg)   Overweight woman, no distress.  HEENT: Conjunctiva and lids normal, oropharynx clear.  Neck: Supple, no elevated JVP or carotid bruits, no thyromegaly.  Lungs: Clear to auscultation, nonlabored breathing at rest.  Cardiac: Regular rate and rhythm, no S3, 2/6 systolic murmur, no pericardial rub.  Abdomen: Soft, nontender, bowel sounds present, no guarding or rebound.  Extremities: No pitting edema, distal pulses 2+.    Problem List and Plan   Palpitations This has been better controlled on Cardia XT 180 mg daily. Also reports that blood pressure has been more stable. No changes made today. Continue  observation.  MITRAL REGURGITATION Relatively soft murmur on examination. Will consider followup echocardiogram at a later time.    Satira Sark, M.D., F.A.C.C.

## 2014-03-23 NOTE — Patient Instructions (Signed)
Your physician wants you to follow-up in: 6 months with Dr.McDowell You will receive a reminder letter in the mail two months in advance. If you don't receive a letter, please call our office to schedule the follow-up appointment.     Your physician recommends that you continue on your current medications as directed. Please refer to the Current Medication list given to you today.     Thank you for choosing [facility] !        

## 2014-05-27 ENCOUNTER — Ambulatory Visit (INDEPENDENT_AMBULATORY_CARE_PROVIDER_SITE_OTHER): Payer: BLUE CROSS/BLUE SHIELD | Admitting: Women's Health

## 2014-05-27 ENCOUNTER — Encounter: Payer: Self-pay | Admitting: Women's Health

## 2014-05-27 VITALS — BP 125/85 | Ht 65.0 in | Wt 178.6 lb

## 2014-05-27 DIAGNOSIS — Z01419 Encounter for gynecological examination (general) (routine) without abnormal findings: Secondary | ICD-10-CM

## 2014-05-27 NOTE — Progress Notes (Signed)
Shelley [name] 13-Apr-1958 [id number]    History:    Presents for annual exam.  Supracervical TAH with BSO for fibroids, endometriosis and bilateral endometriomas. No HRT. Normal Pap and mammogram history. Hypertension, hypercholesterolemia managed by primary care. Reports normal DEXA stable without change done at work.  Negative colonoscopy 2007. Has received both Zostavax and Pneumovax at work. Mother breast cancer age 57, colon cancer age 85.  Past medical history, past surgical history, family history and social history were all reviewed and documented in the EPIC chart. 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 Father heart disease. One daughter doing well, Pharmacist, hospital.  ROS:  A ROS was performed and pertinent positives and negatives are included.  Exam:  Filed Vitals:   05/27/14 1024  BP: 125/85    General appearance:  Normal Thyroid:  Symmetrical, normal in size, without palpable masses or nodularity. Respiratory  Auscultation:  Clear without wheezing or rhonchi Cardiovascular  Auscultation:  Regular rate, without rubs, murmurs or gallops  Edema/varicosities:  Not grossly evident Abdominal  Soft,nontender, without masses, guarding or rebound.  Liver/spleen:  No organomegaly noted  Hernia:  None appreciated  Skin  Inspection:  Grossly normal   Breasts: Examined lying and sitting.     Right: Without masses, retractions, discharge or axillary adenopathy.     Left: Without masses, retractions, discharge or axillary adenopathy. Gentitourinary   Inguinal/mons:  Normal without inguinal adenopathy  External genitalia:  Normal  BUS/Urethra/Skene's glands:  Normal  Vagina:  Normal  Cervix:   and uterus absent  Adnexa/parametria:     Rt: Without masses or tenderness.   Lt: Without masses or tenderness.  Anus and perineum: Normal  Digital rectal exam: Normal sphincter tone without palpated masses or tenderness  Assessment/Plan:  59 y.o. [name] for annual exam with no  complaints.  Supracervical TAH with BSO no HRT Hypertension/hypercholesterolemia-primary care manages Labs primary care and work Bulging discs-orthopedist manages  Plan: SBE's, continue annual screening mammogram, 3-D tomography reviewed and encouraged. Exercise, calcium rich diet, continue vitamin D supplement as prescribed. UA, Pap normal 2014, new screening guidelines reviewed.  [place], 1:58 PM 05/27/2014

## 2014-05-27 NOTE — Patient Instructions (Signed)
Health Recommendations for Postmenopausal Women Respected and ongoing research has looked at the most common causes of death, disability, and poor quality of life in postmenopausal women. The causes include heart disease, diseases of blood vessels, diabetes, depression, cancer, and bone loss (osteoporosis). Many things can be done to help lower the chances of developing these and other common problems. CARDIOVASCULAR DISEASE Heart Disease: A heart attack is a medical emergency. Know the signs and symptoms of a heart attack. Below are things women can do to reduce their risk for heart disease.   Do not smoke. If you smoke, quit.  Aim for a healthy weight. Being overweight causes many preventable deaths. Eat a healthy and balanced diet and drink an adequate amount of liquids.  Get moving. Make a commitment to be more physically active. Aim for 30 minutes of activity on most, if not all days of the week.  Eat for heart health. Choose a diet that is low in saturated fat and cholesterol and eliminate trans fat. Include whole grains, vegetables, and fruits. Read and understand the labels on food containers before buying.  Know your numbers. Ask your caregiver to check your blood pressure, cholesterol (total, HDL, LDL, triglycerides) and blood glucose. Work with your caregiver on improving your entire clinical picture.  High blood pressure. Limit or stop your table salt intake (try salt substitute and food seasonings). Avoid salty foods and drinks. Read labels on food containers before buying. Eating well and exercising can help control high blood pressure. STROKE  Stroke is a medical emergency. Stroke may be the result of a blood clot in a blood vessel in the brain or by a brain hemorrhage (bleeding). Know the signs and symptoms of a stroke. To lower the risk of developing a stroke:  Avoid fatty foods.  Quit smoking.  Control your diabetes, blood pressure, and irregular heart rate. THROMBOPHLEBITIS  (BLOOD CLOT) OF THE LEG  Becoming overweight and leading a stationary lifestyle may also contribute to developing blood clots. Controlling your diet and exercising will help lower the risk of developing blood clots. CANCER SCREENING  Breast Cancer: Take steps to reduce your risk of breast cancer.  You should practice "breast self-awareness." This means understanding the normal appearance and feel of your breasts and should include breast self-examination. Any changes detected, no matter how small, should be reported to your caregiver.  After age 40, you should have a clinical breast exam (CBE) every year.  Starting at age 40, you should consider having a mammogram (breast X-ray) every year.  If you have a family history of breast cancer, talk to your caregiver about genetic screening.  If you are at high risk for breast cancer, talk to your caregiver about having an MRI and a mammogram every year.  Intestinal or Stomach Cancer: Tests to consider are a rectal exam, fecal occult blood, sigmoidoscopy, and colonoscopy. Women who are high risk may need to be screened at an earlier age and more often.  Cervical Cancer:  Beginning at age 30, you should have a Pap test every 3 years as long as the past 3 Pap tests have been normal.  If you have had past treatment for cervical cancer or a condition that could lead to cancer, you need Pap tests and screening for cancer for at least 20 years after your treatment.  If you had a hysterectomy for a problem that was not cancer or a condition that could lead to cancer, then you no longer need Pap tests.    If you are between ages 65 and 70, and you have had normal Pap tests going back 10 years, you no longer need Pap tests.  If Pap tests have been discontinued, risk factors (such as a new sexual partner) need to be reassessed to determine if screening should be resumed.  Some medical problems can increase the chance of getting cervical cancer. In these  cases, your caregiver may recommend more frequent screening and Pap tests.  Uterine Cancer: If you have vaginal bleeding after reaching menopause, you should notify your caregiver.  Ovarian Cancer: Other than yearly pelvic exams, there are no reliable tests available to screen for ovarian cancer at this time except for yearly pelvic exams.  Lung Cancer: Yearly chest X-rays can detect lung cancer and should be done on high risk women, such as cigarette smokers and women with chronic lung disease (emphysema).  Skin Cancer: A complete body skin exam should be done at your yearly examination. Avoid overexposure to the sun and ultraviolet light lamps. Use a strong sun block cream when in the sun. All of these things are important for lowering the risk of skin cancer. MENOPAUSE Menopause Symptoms: Hormone therapy products are effective for treating symptoms associated with menopause:  Moderate to severe hot flashes.  Night sweats.  Mood swings.  Headaches.  Tiredness.  Loss of sex drive.  Insomnia.  Other symptoms. Hormone replacement carries certain risks, especially in older women. Women who use or are thinking about using estrogen or estrogen with progestin treatments should discuss that with their caregiver. Your caregiver will help you understand the benefits and risks. The ideal dose of hormone replacement therapy is not known. The Food and Drug Administration (FDA) has concluded that hormone therapy should be used only at the lowest doses and for the shortest amount of time to reach treatment goals.  OSTEOPOROSIS Protecting Against Bone Loss and Preventing Fracture If you use hormone therapy for prevention of bone loss (osteoporosis), the risks for bone loss must outweigh the risk of the therapy. Ask your caregiver about other medications known to be safe and effective for preventing bone loss and fractures. To guard against bone loss or fractures, the following is recommended:  If  you are younger than age 50, take 1000 mg of calcium and at least 600 mg of Vitamin D per day.  If you are older than age 50 but younger than age 70, take 1200 mg of calcium and at least 600 mg of Vitamin D per day.  If you are older than age 70, take 1200 mg of calcium and at least 800 mg of Vitamin D per day. Smoking and excessive alcohol intake increases the risk of osteoporosis. Eat foods rich in calcium and vitamin D and do weight bearing exercises several times a week as your caregiver suggests. DIABETES Diabetes Mellitus: If you have type I or type 2 diabetes, you should keep your blood sugar under control with diet, exercise, and recommended medication. Avoid starchy and fatty foods, and too many sweets. Being overweight can make diabetes control more difficult. COGNITION AND MEMORY Cognition and Memory: Menopausal hormone therapy is not recommended for the prevention of cognitive disorders such as Alzheimer's disease or memory loss.  DEPRESSION  Depression may occur at any age, but it is common in elderly women. This may be because of physical, medical, social (loneliness), or financial problems and needs. If you are experiencing depression because of medical problems and control of symptoms, talk to your caregiver about this. Physical   activity and exercise may help with mood and sleep. Community and volunteer involvement may improve your sense of value and worth. If you have depression and you feel that the problem is getting worse or becoming severe, talk to your caregiver about which treatment options are best for you. ACCIDENTS  Accidents are common and can be serious in elderly woman. Prepare your house to prevent accidents. Eliminate throw rugs, place hand bars in bath, shower, and toilet areas. Avoid wearing high heeled shoes or walking on wet, snowy, and icy areas. Limit or stop driving if you have vision or hearing problems, or if you feel you are unsteady with your movements and  reflexes. HEPATITIS C Hepatitis C is a type of viral infection affecting the liver. It is spread mainly through contact with blood from an infected person. It can be treated, but if left untreated, it can lead to severe liver damage over the years. Many people who are infected do not know that the virus is in their blood. If you are a "baby-boomer", it is recommended that you have one screening test for Hepatitis C. IMMUNIZATIONS  Several immunizations are important to consider having during your senior years, including:   Tetanus, diphtheria, and pertussis booster shot.  Influenza every year before the flu season begins.  Pneumonia vaccine.  Shingles vaccine.  Others, as indicated based on your specific needs. Talk to your caregiver about these. Document Released: 04/21/2005 Document Revised: 07/14/2013 Document Reviewed: 12/16/2007 Exit[employer] Patient Information 2015 [employer]. This information is not intended to replace advice given to you by your health care provider. Make sure you discuss any questions you have with your health care provider.  

## 2014-05-29 ENCOUNTER — Encounter: Payer: Self-pay | Admitting: Women's Health

## 2014-08-21 ENCOUNTER — Telehealth: Payer: Self-pay | Admitting: Gastroenterology

## 2014-08-21 NOTE — Telephone Encounter (Signed)
Pt states she has had some issues with her hemorrhoids. She used some supp. That she was given but she states there is still "something hanging out of her butt." Pt will use OTC meds and soaks and wipes until she can be seen. Pt scheduled to see Lori Hvozdovic, PA-C 09/09/14@2 :15pm. Pt aware of appt.

## 2014-09-09 ENCOUNTER — Ambulatory Visit (INDEPENDENT_AMBULATORY_CARE_PROVIDER_SITE_OTHER): Payer: BLUE CROSS/BLUE SHIELD | Admitting: Physician Assistant

## 2014-09-09 ENCOUNTER — Encounter: Payer: Self-pay | Admitting: Physician Assistant

## 2014-09-09 VITALS — BP 142/84 | HR 68 | Ht 65.0 in | Wt 182.5 lb

## 2014-09-09 DIAGNOSIS — K219 Gastro-esophageal reflux disease without esophagitis: Secondary | ICD-10-CM

## 2014-09-09 DIAGNOSIS — K648 Other hemorrhoids: Secondary | ICD-10-CM | POA: Diagnosis not present

## 2014-09-09 DIAGNOSIS — Z800 Family history of malignant neoplasm of digestive organs: Secondary | ICD-10-CM

## 2014-09-09 DIAGNOSIS — K644 Residual hemorrhoidal skin tags: Secondary | ICD-10-CM

## 2014-09-09 MED ORDER — HYDROCORTISONE ACE-PRAMOXINE 2.5-1 % RE CREA: 1.0000 "application " | TOPICAL_CREAM | Freq: Two times a day (BID) | RECTAL | Status: DC

## 2014-09-09 NOTE — Progress Notes (Signed)
Agree w/ Ms. Hvozdovic's note and mangement.  

## 2014-09-09 NOTE — Progress Notes (Signed)
Patient ID: Shelley [name], female   DOB: October 02, 1955, 59 y.o.   MRN: [id number]    HPI:  Shelley [name] is a 59 y.o.   female referred by Shelley Munch, PA-C  for evaluation of hemorrhoids.    Shelley [name] is previously known to Dr. Sharlett Iles. She had an upper endoscopy in May 2007 at which time she was noted to have a hiatal hernia with erosions present in the distal esophagus and granular mucosa in the distal esophagus. Biopsies were negative for Barrett's however the patient states she was advised by Dr. Sharlett Iles to have surveillance in 5 years due to to the severity and longevity of her GERD and her family history of Barrett's esophagus. She also had a colonoscopy on 07/31/2005 which was a normal exam. She was advised to have surveillance in 10 years which would be 2000 17. She states though that since that exam her mother was diagnosed with colon cancer in her late 33s. Shelley [name] herself has had no change in her bowel habits or stool caliber. She does note that she has had a lump near her rectum that was very painful at first but is now tolerable. She has had no bright red blood per rectum or melena. She has used preparation H which has helped with her discomfort. With regards to her GERD, she is using Prevacid 30 mg at bedtime and 15 mg in the morning and despite this gets breakthrough heartburn on a daily basis. She has no dysphasia. She states her father has Barrett's esophagus.   Past Medical History  Diagnosis Date  . Palpitations     Past Surgical History  Procedure Laterality Date  . Total abdominal hysterectomy w/ bilateral salpingoophorectomy    . Childbirth     Family History  Problem Relation Age of Onset  . Hyperlipidemia Mother   . Hypertension Mother   . Breast cancer Mother 41  . Colon cancer Mother 10  . Hyperlipidemia Father     History of coronary artery by  . Breast cancer Paternal Aunt     After age 23  . Breast cancer Paternal Aunt     After age 29  . Bladder  Cancer Father    History  Substance Use Topics  . Smoking status: Former Smoker    Types: Cigarettes    Quit date: 11/26/2010  . Smokeless tobacco: Never Used     Comment: 5 ciggs per week  . Alcohol Use: No   Current Outpatient Prescriptions  Medication Sig Dispense Refill  . amitriptyline (ELAVIL) 25 MG tablet Take 12.5 mg by mouth at bedtime.    Marland Kitchen aspirin EC 81 MG tablet Take 81 mg by mouth at bedtime.    . bisoprolol-hydrochlorothiazide (ZIAC) 5-6.25 MG per tablet Take 0.5 tablets by mouth 2 (two) times daily.    Marland Kitchen diltiazem (CARTIA XT) 180 MG 24 hr capsule Take 1 capsule (180 mg total) by mouth daily. 90 capsule 3  . fenofibrate 160 MG tablet Take 160 mg by mouth every morning.    . lansoprazole (PREVACID) 30 MG capsule Take 30 mg by mouth 2 (two) times daily before a meal. rx strength in am otc pm (45mg /daily)    . metaxalone (SKELAXIN) 800 MG tablet Take 400 mg by mouth daily as needed for muscle spasms.     . montelukast (SINGULAIR) 10 MG tablet Take 10 mg by mouth every morning.     . rosuvastatin (CRESTOR) 40 MG tablet Take 10 mg by mouth every  evening.    . Vitamin D, Ergocalciferol, (DRISDOL) 50000 UNITS CAPS capsule Take 50,000 Units by mouth every 7 (seven) days.    . hydrocortisone-pramoxine (ANALPRAM-HC) 2.5-1 % rectal cream Place 1 application rectally 2 (two) times daily. Apply Rectal Cream twice daily for 2 weeks 30 g 1   No current facility-administered medications for this visit.   Allergies  Allergen Reactions  . Codeine Nausea And Vomiting  . Morphine Nausea And Vomiting  . Predicort [Prednisolone] Other (See Comments)    Cannot take orally, states that she can take via injection   . Penicillins Swelling and Rash    Childhood reaction     Review of Systems: Gen: Denies any fever, chills, sweats, anorexia, fatigue, weakness, malaise, weight loss, and sleep disorder CV: Denies chest pain, angina, palpitations, syncope, orthopnea, PND, peripheral edema, and  claudication. Resp: Denies dyspnea at rest, dyspnea with exercise, cough, sputum, wheezing, coughing up blood, and pleurisy. GI: Denies vomiting blood, jaundice, and fecal incontinence.   Denies dysphagia or odynophagia. GU : Denies urinary burning, blood in urine, urinary frequency, urinary hesitancy, nocturnal urination, and urinary incontinence. MS: Denies joint pain, limitation of movement, and swelling, stiffness, low back pain, extremity pain. Denies muscle weakness, cramps, atrophy.  Derm: Denies rash, itching, dry skin, hives, moles, warts, or unhealing ulcers.  Psych: Denies depression, anxiety, memory loss, suicidal ideation, hallucinations, paranoia, and confusion. Heme: Denies bruising, bleeding, and enlarged lymph nodes. Neuro:  Denies any headaches, dizziness, paresthesias. Endo:  Denies any problems with DM, thyroid, adrenal function   Prior Endoscopies:    See history of present illness  Physical Exam: BP 142/84 mmHg  Pulse 68  Ht 5\' 5"  (1.651 m)  Wt 182 lb 8 oz (82.781 kg)  BMI 30.37 kg/m2 Constitutional: Pleasant,well-developedfemale in no acute distress. HEENT: Normocephalic and atraumatic. Conjunctivae are normal. No scleral icterus. Neck supple.  No thyromegaly Cardiovascular: Normal rate, regular rhythm.  Pulmonary/chest: Effort normal and breath sounds normal. No wheezing, rales or rhonchi. Abdominal: Soft, nondistended, nontender. Bowel sounds active throughout. There are no masses palpable. No hepatomegaly. Rectal: there is a large nonthrombosed external hemorrhoid. Brown stool test heme negative. Anoscopy with internal hemorrhoids noted. Extremities: no edema Lymphadenopathy: No cervical adenopathy noted. Neurological: Alert and oriented to person place and time. Skin: Skin is warm and dry. No rashes noted. Psychiatric: Normal mood and affect. Behavior is normal.  ASSESSMENT AND PLAN:  #1. GERD. An antireflux regimen has been reviewed. She will continue  Prevacid. She will be scheduled for an EGD to evaluate for esophagitis, Barrett's, gastritis etc.The risks, benefits, and alternatives to endoscopy with possible biopsy and possible dilation were discussed with the patient and they consent to proceed.     #2. Internal and external hemorrhoids. Patient has been provided a hemorrhoid patient education sheet. She will use sitz baths and Tucks wipes. She will be given a trial of Analpram cream 2.5% to apply rectally twice a day for 10-14 days.    #3. Family history of colon cancer. She will be scheduled for colonoscopy to screen for polyps or neoplasia.The risks, benefits, and alternatives to colonoscopy with possible biopsy and possible polypectomy were discussed with the patient and they consent to proceed.  The patient would like to discuss hemorrhoidal banding if her hemorrhoids are noted to be appropriate for banding at the time of colonoscopy ( she is aware banding would be done at a later date in the office.) The procedures will be scheduled with Dr. Carlean Purl per patient  request.    Tienna Bienkowski, Vita Barley PA-C 09/09/2014, 3:25 PM  CC: Shelley Munch, PA-C

## 2014-09-09 NOTE — Patient Instructions (Signed)
You have been scheduled for an endoscopy and colonoscopy. Please follow the written instructions given to you at your visit today. Please pick up your prep supplies at the pharmacy within the next 1-3 days. If you use inhalers (even only as needed), please bring them with you on the day of your procedure. Your physician has requested that you go to www.startemmi.com and enter the access code given to you at your visit today. This web site gives a general overview about your procedure. However, you should still follow specific instructions given to you by our office regarding your preparation for the procedure.  We have sent medications to your pharmacy for you to pick up at your convenience.  Use Tucks wipes for Hemorrhoids.

## 2014-10-27 ENCOUNTER — Encounter: Payer: Self-pay | Admitting: Internal Medicine

## 2014-11-10 ENCOUNTER — Ambulatory Visit (AMBULATORY_SURGERY_CENTER): Payer: BLUE CROSS/BLUE SHIELD | Admitting: Internal Medicine

## 2014-11-10 ENCOUNTER — Encounter: Payer: Self-pay | Admitting: Internal Medicine

## 2014-11-10 VITALS — BP 116/70 | HR 62 | Temp 98.2°F | Resp 51 | Ht 65.0 in | Wt 182.0 lb

## 2014-11-10 DIAGNOSIS — Z1211 Encounter for screening for malignant neoplasm of colon: Secondary | ICD-10-CM | POA: Diagnosis not present

## 2014-11-10 DIAGNOSIS — Z800 Family history of malignant neoplasm of digestive organs: Secondary | ICD-10-CM

## 2014-11-10 DIAGNOSIS — K219 Gastro-esophageal reflux disease without esophagitis: Secondary | ICD-10-CM | POA: Diagnosis not present

## 2014-11-10 MED ORDER — SODIUM CHLORIDE 0.9 % IV SOLN: 500.0000 mL | INTRAVENOUS | Status: DC

## 2014-11-10 NOTE — Patient Instructions (Addendum)
You do not have Barrett's esophagus.  No polyps or cancer. You do have diverticulosis - thickened muscle rings and pouches in the colon wall. Please read the handout about this condition. also probably have some adhesions from hysterectomy.  Your next routine colonoscopy should be in 5 years - 2021.  I appreciate the opportunity to care for you. Gatha Mayer, MD, FACG   YOU HAD AN ENDOSCOPIC PROCEDURE TODAY AT [facility]:   Refer to the procedure report that was given to you for any specific questions about what was found during the examination.  If the procedure report does not answer your questions, please call your gastroenterologist to clarify.  If you requested that your care partner not be given the details of your procedure findings, then the procedure report has been included in a sealed envelope for you to review at your convenience later.  YOU SHOULD EXPECT: Some feelings of bloating in the abdomen. Passage of more gas than usual.  Walking can help get rid of the air that was put into your GI tract during the procedure and reduce the bloating. If you had a lower endoscopy (such as a colonoscopy or flexible sigmoidoscopy) you may notice spotting of blood in your stool or on the toilet paper. If you underwent a bowel prep for your procedure, you may not have a normal bowel movement for a few days.  Please Note:  You might notice some irritation and congestion in your nose or some drainage.  This is from the oxygen used during your procedure.  There is no need for concern and it should clear up in a day or so.  SYMPTOMS TO REPORT IMMEDIATELY:   Following lower endoscopy (colonoscopy or flexible sigmoidoscopy):  Excessive amounts of blood in the stool  Significant tenderness or worsening of abdominal pains  Swelling of the abdomen that is new, acute  Fever of 100F or higher   Following upper endoscopy (EGD)  Vomiting of blood or coffee ground  material  New chest pain or pain under the shoulder blades  Painful or persistently difficult swallowing  New shortness of breath  Fever of 100F or higher  Black, tarry-looking stools  For urgent or emergent issues, a gastroenterologist can be reached at any hour by calling [phone].   DIET: Your first meal following the procedure should be a small meal and then it is ok to progress to your normal diet. Heavy or fried foods are harder to digest and may make you feel nauseous or bloated.  Likewise, meals heavy in dairy and vegetables can increase bloating.  Drink plenty of fluids but you should avoid alcoholic beverages for 24 hours.  ACTIVITY:  You should plan to take it easy for the rest of today and you should NOT DRIVE or use heavy machinery until tomorrow (because of the sedation medicines used during the test).    FOLLOW UP: Our staff will call the number listed on your records the next business day following your procedure to check on you and address any questions or concerns that you may have regarding the information given to you following your procedure. If we do not reach you, we will leave a message.  However, if you are feeling well and you are not experiencing any problems, there is no need to return our call.  We will assume that you have returned to your regular daily activities without incident.  If any biopsies were taken you will be contacted by  phone or by letter within the next 1-3 weeks.  Please call us at [phone] if you have not heard about the biopsies in 3 weeks.    SIGNATURES/CONFIDENTIALITY: You and/or your care partner have signed paperwork which will be entered into your electronic medical record.  These signatures attest to the fact that that the information above on your After Visit Summary has been reviewed and is understood.  Full responsibility of the confidentiality of this discharge information lies with you and/or your care-partner.  Please  review diverticulosis handout provided.

## 2014-11-10 NOTE — Op Note (Signed)
[place]  [address]. [place], [address]   COLONOSCOPY PROCEDURE REPORT  PATIENT: Shelley [name], Shelley [name]  MR#: [id number] BIRTHDATE: 01-24-1956 , 59  yrs. old GENDER: female ENDOSCOPIST: Gatha Mayer, MD, [facility] PROCEDURE DATE:  11/10/2014 PROCEDURE:   Colonoscopy, screening First Screening Colonoscopy - Avg.  risk and is 50 yrs.  old or older - No.  Prior Negative Screening - Now for repeat screening. Less than 10 yrs Prior Negative Screening - Now for repeat screening.  Above average risk  History of Adenoma - Now for follow-up colonoscopy & has been > or = to 3 yrs.  N/A  Polyps removed today? No Recommend repeat exam, <10 yrs? Yes high risk ASA CLASS:   Class I INDICATIONS:Screening for colonic neoplasia and FH Colon or Rectal Adenocarcinoma. MEDICATIONS: Residual sedation present, Propofol 200 mg IV, and Monitored anesthesia care  DESCRIPTION OF PROCEDURE:   After the risks benefits and alternatives of the procedure were thoroughly explained, informed consent was obtained.  The digital rectal exam revealed no abnormalities of the rectum.   The LB [id number]  endoscope was introduced through the anus and advanced to the cecum, which was identified by both the appendix and ileocecal valve. No adverse events experienced.   The quality of the prep was excellent. (MiraLax was used)  The instrument was then slowly withdrawn as the colon was fully examined. Estimated blood loss is zero unless otherwise noted in this procedure report.      COLON FINDINGS: There was severe diverticulosis noted in the sigmoid colon with associated luminal narrowing and angulation.   The examination was otherwise normal.  Retroflexed views revealed no abnormalities. The time to cecum = 6.2 Withdrawal time = 9.3   The scope was withdrawn and the procedure completed. COMPLICATIONS: There were no immediate complications.  ENDOSCOPIC IMPRESSION: 1.   There was severe  diverticulosis noted in the sigmoid colon 2.   The examination was otherwise normal - excellent prep  RECOMMENDATIONS: Repeat Colonoscopy in 5 years.  FHx CRCA  eSigned:  Gatha Mayer, MD, [facility] 11/10/2014 2:12 PM   cc: The Patient and Collene Mares, MD

## 2014-11-10 NOTE — Progress Notes (Signed)
Transferred to recovery room. A/O x3, pleased with MAC.  VSS.  Report to Wendy, RN. 

## 2014-11-10 NOTE — Op Note (Addendum)
[place]  [address] [place], [address]   ENDOSCOPY PROCEDURE REPORT  PATIENT: Shelley, [name]  MR#: [id number] BIRTHDATE: March 11, 1956 , 89  yrs. old GENDER: female ENDOSCOPIST: Gatha Mayer, MD, [facility] PROCEDURE DATE:  11/10/2014 PROCEDURE:  Esophagoscopy ASA CLASS:     Class I INDICATIONS:  screening for Barrett's. MEDICATIONS: Propofol 150 mg IV and Monitored anesthesia care TOPICAL ANESTHETIC: none  DESCRIPTION OF PROCEDURE: After the risks benefits and alternatives of the procedure were thoroughly explained, informed consent was obtained.  The LB [id number] endoscope was introduced through the mouth and advanced to the second portion of the duodenum , Without limitations.  The instrument was slowly withdrawn as the mucosa was fully examined.    1) small hiatal hernia w/ slightly irregular Z-line 2) No Barrett's present  3) Otherwise normal.  Retroflexed views revealed as previously described.     The scope was then withdrawn from the patient and the procedure completed.  COMPLICATIONS: There were no immediate complications.  ENDOSCOPIC IMPRESSION: 1) small hiatal hernia w/ slightly irregular Z-line 2) No Barrett's present 3) Otherwise normal  RECOMMENDATIONS: Continue PPI   eSigned:  Gatha Mayer, MD, [facility] 11/10/2014 2:08 PM    CC: Collene Mares, MD and The Patient

## 2014-11-11 ENCOUNTER — Telehealth: Payer: Self-pay | Admitting: *Deleted

## 2014-11-11 NOTE — Telephone Encounter (Signed)
  Follow up Call-  Call back number 11/10/2014  Post procedure Call Back phone  # [phone]  Permission to leave phone message Yes     Patient questions:  Do you have a fever, pain , or abdominal swelling? No. Pain Score  0 *  Have you tolerated food without any problems? Yes.    Have you been able to return to your normal activities? Yes.    Do you have any questions about your discharge instructions: Diet   No. Medications  No. Follow up visit  No.  Do you have questions or concerns about your Care? No.  Actions: * If pain score is 4 or above: No action needed, pain <4.

## 2015-03-26 ENCOUNTER — Telehealth: Payer: Self-pay | Admitting: Internal Medicine

## 2015-03-26 NOTE — Telephone Encounter (Signed)
Patient advised that she will need eval prior to calling in antibiotics.  She can't come in the office until Wed.  She is given an appt for 03/31/15 1:30 with Alonza Bogus, PA

## 2015-03-26 NOTE — Telephone Encounter (Signed)
patient states that she is having a diverticulitis flare up. she states that she usually goes to her pcp for treatment but she no longer has a pcp and is wanting to know if Dr. Carlean Purl can prescribe her something. last OV 08-2014. appointment scheduled for March. [facility] in [place].

## 2015-03-31 ENCOUNTER — Encounter: Payer: Self-pay | Admitting: Gastroenterology

## 2015-03-31 ENCOUNTER — Ambulatory Visit (INDEPENDENT_AMBULATORY_CARE_PROVIDER_SITE_OTHER): Payer: BLUE CROSS/BLUE SHIELD | Admitting: Gastroenterology

## 2015-03-31 VITALS — BP 128/90 | HR 60 | Ht 65.0 in | Wt 181.8 lb

## 2015-03-31 DIAGNOSIS — R1032 Left lower quadrant pain: Secondary | ICD-10-CM

## 2015-03-31 DIAGNOSIS — R140 Abdominal distension (gaseous): Secondary | ICD-10-CM

## 2015-03-31 DIAGNOSIS — K5900 Constipation, unspecified: Secondary | ICD-10-CM | POA: Diagnosis not present

## 2015-03-31 NOTE — Patient Instructions (Addendum)
Please have your labs faxed over to the office.   Start Miralax daily.  Gas-x or Phazyme as needed.   You have been scheduled for a CT scan of the abdomen and pelvis at [facility]   You are scheduled on Tuesday 04/06/15 at 8:30 am. You should arrive 15 minutes prior to your appointment time for registration. Please follow the written instructions below on the day of your exam:  WARNING: IF YOU ARE ALLERGIC TO IODINE/X-RAY DYE, PLEASE NOTIFY RADIOLOGY IMMEDIATELY AT [phone]! YOU WILL BE GIVEN A 13 HOUR PREMEDICATION PREP.  1) Do not eat or drink anything after 4 am (4 hours prior to your test) 2) You have been given 2 bottles of oral contrast to drink. The solution may taste  better if refrigerated, but do NOT add ice or any other liquid to this solution. Shake well before drinking.    Drink 1 bottle of contrast @ 6:30 am (2 hours prior to your exam)  Drink 1 bottle of contrast @ 7:30 am (1 hour prior to your exam)  You may take any medications as prescribed with a small amount of water except for the following: Metformin, Glucophage, Glucovance, Avandamet, Riomet, Fortamet, Actoplus Met, Janumet, Glumetza or Metaglip. The above medications must be held the day of the exam AND 48 hours after the exam.  The purpose of you drinking the oral contrast is to aid in the visualization of your intestinal tract. The contrast solution may cause some diarrhea. Before your exam is started, you will be given a small amount of fluid to drink. Depending on your individual set of symptoms, you may also receive an intravenous injection of x-ray contrast/dye. Plan on being at [facility] for 30 minutes or longer, depending on the type of exam you are having performed.  This test typically takes 30-45 minutes to complete.  If you have any questions regarding your exam or if you need to reschedule, you may call the CT department at [phone] between the hours of 8:00 am and 5:00 pm,  Monday-Friday.  ________________________________________________________________________

## 2015-04-01 ENCOUNTER — Encounter: Payer: Self-pay | Admitting: Gastroenterology

## 2015-04-01 DIAGNOSIS — K5900 Constipation, unspecified: Secondary | ICD-10-CM | POA: Insufficient documentation

## 2015-04-01 DIAGNOSIS — R1032 Left lower quadrant pain: Secondary | ICD-10-CM | POA: Insufficient documentation

## 2015-04-01 DIAGNOSIS — R140 Abdominal distension (gaseous): Secondary | ICD-10-CM | POA: Insufficient documentation

## 2015-04-01 NOTE — Progress Notes (Signed)
Agree with Ms. Zehr's management.  Artemus Romanoff E. Earle Troiano, MD, FACG  

## 2015-04-01 NOTE — Progress Notes (Signed)
     04/01/2015 CHRISSA [name] [id number] 25-Oct-1955   History of Present Illness:  This is a pleasant 60 year old female who is known to Dr. Carlean Purl for colonoscopy in August 2016 at  which time she was found have diverticulosis. She does have family history of colon cancer in her mother so it was recommended that she have a repeat colonoscopy in 5 year interval. Anyway, she presents to our office today with complaints of sudden onset new constipation associated with abdominal bloating and some left lower quadrant abdominal discomfort. She says that she has been extremely constipated and has been taking a lot of milk of magnesia and Ex-Lax to help her move her bowels. She's been having liquid stools but still feels extremely bloated and uncomfortable. She admits that she did pass some blood with her stools as well.  She's never had any similar symptoms in the past.  She tells me that she had labs done recently through her work and that they were all okay, including thyroid studies. We are trying to obtain those. She says that all of her medication she has been taking for a long time, so no medications are new. She tells me that she had some Cipro at home for something else that was treated in the past and she began taking the Cipro about a week ago for this pain.   Current Medications, Allergies, Past Medical History, Past Surgical History, Family History and Social History were reviewed in [employer] record.   Physical Exam: BP 128/90 mmHg  Pulse 60  Ht 5\' 5"  (1.651 m)  Wt 181 lb 12.8 oz (82.464 kg)  BMI 30.25 kg/m2 General: Well developed white female in no acute distress Head: Normocephalic and atraumatic Eyes:  Sclerae anicteric, conjunctiva pink  Ears: Normal auditory acuity Lungs: Clear throughout to auscultation Heart: Regular rate and rhythm Abdomen: Soft, non-distended.  Normal bowel sounds.  Mild LLQ TTP. Musculoskeletal: Symmetrical with no gross  deformities  Extremities: No edema  Neurological: Alert oriented x 4, grossly non-focal Psychological:  Alert and cooperative. Normal mood and affect  Assessment and Recommendations: -LLQ abdominal pain with constipation and bloating:  This is new, occurring over the past 10 days.  I doubt diverticulitis, but will check CT scan of the abdomen and pelvis with contrast to help rule this out.  In the meantime I'm going to have her begin taking MiraLAX once to twice daily to keep her bowels moving well. She can take Gas-X or Phazyme over-the-counter as needed. She will have her recent labs from December faxed to our office.

## 2015-04-06 ENCOUNTER — Ambulatory Visit (HOSPITAL_COMMUNITY)
Admission: RE | Admit: 2015-04-06 | Discharge: 2015-04-06 | Disposition: A | Discharge status: Home / Self Care | Payer: BLUE CROSS/BLUE SHIELD | Source: Ambulatory Visit | Attending: Gastroenterology | Admitting: Gastroenterology

## 2015-04-06 DIAGNOSIS — K760 Fatty (change of) liver, not elsewhere classified: Secondary | ICD-10-CM | POA: Diagnosis not present

## 2015-04-06 DIAGNOSIS — M5126 Other intervertebral disc displacement, lumbar region: Secondary | ICD-10-CM | POA: Insufficient documentation

## 2015-04-06 DIAGNOSIS — R1032 Left lower quadrant pain: Secondary | ICD-10-CM | POA: Insufficient documentation

## 2015-04-06 DIAGNOSIS — I700 Atherosclerosis of aorta: Secondary | ICD-10-CM | POA: Diagnosis not present

## 2015-04-06 DIAGNOSIS — K5900 Constipation, unspecified: Secondary | ICD-10-CM | POA: Diagnosis not present

## 2015-04-06 DIAGNOSIS — R140 Abdominal distension (gaseous): Secondary | ICD-10-CM

## 2015-04-06 MED ORDER — IOHEXOL 300 MG/ML  SOLN
100.0000 mL | Freq: Once | INTRAMUSCULAR | Status: AC | PRN
  Administered 2015-04-06: 100 mL via INTRAVENOUS

## 2015-05-25 ENCOUNTER — Ambulatory Visit: Payer: BLUE CROSS/BLUE SHIELD | Admitting: Internal Medicine

## 2015-05-28 ENCOUNTER — Encounter: Payer: Self-pay | Admitting: Women's Health

## 2015-05-28 ENCOUNTER — Ambulatory Visit (INDEPENDENT_AMBULATORY_CARE_PROVIDER_SITE_OTHER): Payer: BLUE CROSS/BLUE SHIELD | Admitting: Women's Health

## 2015-05-28 VITALS — BP 126/78 | Ht 65.0 in | Wt 184.0 lb

## 2015-05-28 DIAGNOSIS — Z01419 Encounter for gynecological examination (general) (routine) without abnormal findings: Secondary | ICD-10-CM

## 2015-05-28 NOTE — Patient Instructions (Signed)

## 2015-05-28 NOTE — Progress Notes (Signed)
Shelley [name] Jul 31, 1955 [id number]    History:    Presents for annual exam.  2. Cervical TAH with BSO for fibroids and endometriosis on no HRT. Normal Pap and mammogram history. Hypertension/hypercholesterolemia primary care. Normal DEXA at work.  2016 no polyps/diverticulosis oncolonoscopy. Has had Zostavax and Pneumovax. Mother history of breast cancer at age 58, colon cancer age 60 survivor. Has had long-term problems with back pain bulging discs.  Past medical history, past surgical history, family history and social history were all reviewed and documented in the EPIC chart. Works in [employer] at [employer]. Part-time at [employer]. 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   ROS:  A ROS was performed and pertinent positives and negatives are included.  Exam:  Filed Vitals:   05/28/15 1004  BP: 126/78    General appearance:  Normal Thyroid:  Symmetrical, normal in size, without palpable masses or nodularity. Respiratory  Auscultation:  Clear without wheezing or rhonchi Cardiovascular  Auscultation:  Regular rate, without rubs, murmurs or gallops  Edema/varicosities:  Not grossly evident Abdominal  Soft,nontender, without masses, guarding or rebound.  Liver/spleen:  No organomegaly noted  Hernia:  None appreciated  Skin  Inspection:  Grossly normal   Breasts: Examined lying and sitting.     Right: Without masses, retractions, discharge or axillary adenopathy.     Left: Without masses, retractions, discharge or axillary adenopathy. Gentitourinary   Inguinal/mons:  Normal without inguinal adenopathy  External genitalia:  Normal  BUS/Urethra/Skene's glands:  Normal  Vagina:  Normal  Cervix:  Normal  Uterus:  Absent  Adnexa/parametria:     Rt: Without masses or tenderness.   Lt: Without masses or tenderness.  Anus and perineum: Normal  Digital rectal exam: Normal sphincter tone without palpated masses or tenderness  Assessment/Plan:  60 y.o. [name] for annual exam.   Supracervical TAH  with BSO for fibroids and endometriosis/not sexually active/no HRT Hypertension/hypercholesterolemia-primary care Normal DEXA Asymptomatic diverticulosis  Plan: SBE's, continue annual 3-D screening mammogram, calcium rich diet, continue vitamin D supplement per primary care. Increasing exercise and decreasing calories for weight loss encouraged. Home safety, fall prevention and importance of weightbearing exercise reviewed. Pap normal 2014, Pap with HR HPV typing, new screening guidelines reviewed.   [name] [facility], 10:41 AM 05/28/2015

## 2015-05-29 LAB — URINALYSIS W MICROSCOPIC + REFLEX CULTURE
BACTERIA UA: NONE SEEN [HPF]
BILIRUBIN URINE: NEGATIVE
CRYSTALS: NONE SEEN [HPF]
Casts: NONE SEEN [LPF]
Glucose, UA: NEGATIVE
Hgb urine dipstick: NEGATIVE
Ketones, ur: NEGATIVE
Nitrite: NEGATIVE
PROTEIN: NEGATIVE
RBC / HPF: NONE SEEN RBC/HPF (ref <=2)
Specific Gravity, Urine: 1.008 (ref 1.001–1.035)
Squamous Epithelial / LPF: NONE SEEN [HPF] (ref <=5)
WBC, UA: NONE SEEN WBC/HPF (ref <=5)
YEAST: NONE SEEN [HPF]
pH: 7 (ref 5.0–8.0)

## 2015-05-30 LAB — URINE CULTURE
Colony Count: NO GROWTH
Organism ID, Bacteria: NO GROWTH

## 2015-06-01 LAB — PAP, TP IMAGING W/ HPV RNA, RFLX HPV TYPE 16,18/45: HPV mRNA, High Risk: NOT DETECTED

## 2015-06-14 ENCOUNTER — Encounter: Payer: Self-pay | Admitting: Women's Health

## 2015-06-16 DIAGNOSIS — E782 Mixed hyperlipidemia: Secondary | ICD-10-CM | POA: Diagnosis not present

## 2015-06-16 DIAGNOSIS — E559 Vitamin D deficiency, unspecified: Secondary | ICD-10-CM | POA: Diagnosis not present

## 2015-06-16 DIAGNOSIS — I10 Essential (primary) hypertension: Secondary | ICD-10-CM | POA: Diagnosis not present

## 2015-08-18 DIAGNOSIS — E559 Vitamin D deficiency, unspecified: Secondary | ICD-10-CM | POA: Diagnosis not present

## 2015-08-18 DIAGNOSIS — E782 Mixed hyperlipidemia: Secondary | ICD-10-CM | POA: Diagnosis not present

## 2015-08-18 DIAGNOSIS — Z008 Encounter for other general examination: Secondary | ICD-10-CM | POA: Diagnosis not present

## 2015-08-18 DIAGNOSIS — I10 Essential (primary) hypertension: Secondary | ICD-10-CM | POA: Diagnosis not present

## 2015-09-02 ENCOUNTER — Telehealth: Payer: Self-pay

## 2015-09-02 MED ORDER — ESTRADIOL 0.1 MG/GM VA CREA: 1.0000 | TOPICAL_CREAM | VAGINAL | Status: DC

## 2015-09-02 NOTE — Telephone Encounter (Signed)
Just wanted to clarify that you did only want her to apply it externally. She said in the past she used an applicator and inserted it. I told her I would double check with you.

## 2015-09-02 NOTE — Telephone Encounter (Signed)
Patient said when she as in you asked her if she needed Premarin cream or Estrace cream but she declined because was not using it very much. She said dryness is a real problem now and would like to have Rx.

## 2015-09-02 NOTE — Telephone Encounter (Signed)
Okay to insert vaginally since she didin  the past best to use the least amount that will take care of the symptoms.

## 2015-09-02 NOTE — Telephone Encounter (Signed)
Okay I think she has used Estrace or estradiol cream in the past, Use small amount twice weekly externally review minimal systemic effects mother with history of breast cancer. I don't think she is sexually active but if she is tell her to use lubricant also.

## 2015-09-09 DIAGNOSIS — Z1389 Encounter for screening for other disorder: Secondary | ICD-10-CM | POA: Diagnosis not present

## 2015-09-09 DIAGNOSIS — M545 Low back pain: Secondary | ICD-10-CM | POA: Diagnosis not present

## 2015-09-09 DIAGNOSIS — Z6830 Body mass index (BMI) 30.0-30.9, adult: Secondary | ICD-10-CM | POA: Diagnosis not present

## 2015-09-09 DIAGNOSIS — E6609 Other obesity due to excess calories: Secondary | ICD-10-CM | POA: Diagnosis not present

## 2015-10-20 DIAGNOSIS — Z008 Encounter for other general examination: Secondary | ICD-10-CM | POA: Diagnosis not present

## 2015-10-20 DIAGNOSIS — I10 Essential (primary) hypertension: Secondary | ICD-10-CM | POA: Diagnosis not present

## 2015-10-20 DIAGNOSIS — E559 Vitamin D deficiency, unspecified: Secondary | ICD-10-CM | POA: Diagnosis not present

## 2015-10-20 DIAGNOSIS — E782 Mixed hyperlipidemia: Secondary | ICD-10-CM | POA: Diagnosis not present

## 2015-11-15 IMAGING — CT CT HEAD W/O CM
1 series · 16 of 30 positions shown, 20 images · non-contrast
Comparison: None.

CLINICAL DATA: Headache, tinnitus.

EXAM:
CT HEAD WITHOUT CONTRAST
TECHNIQUE: Contiguous axial images were obtained from the base of the skull
through the vertex without intravenous contrast.

[Series 2: headseq 4.8 h37s · axial · 0.43mm/px · z∈[+102,+265]mm · 16 of 36 slices shown, 20 images]
[im 2/36  brain]
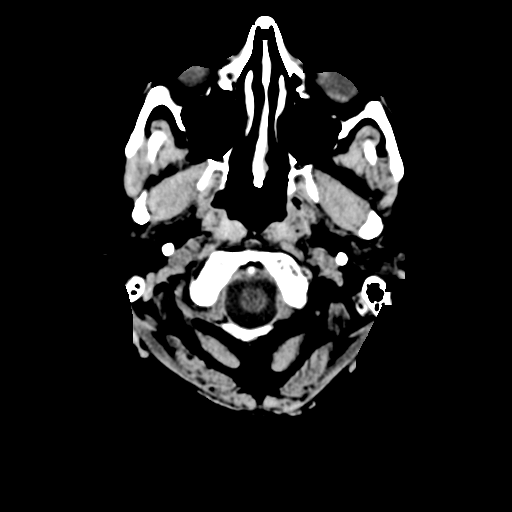
[im 2/36  bone]
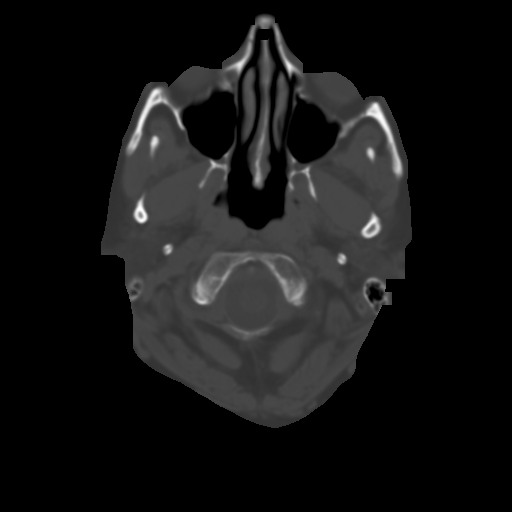
[im 4/36  brain]
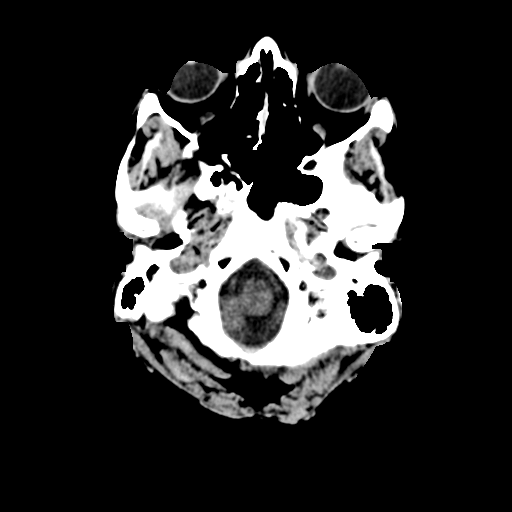
[im 7/36  brain]
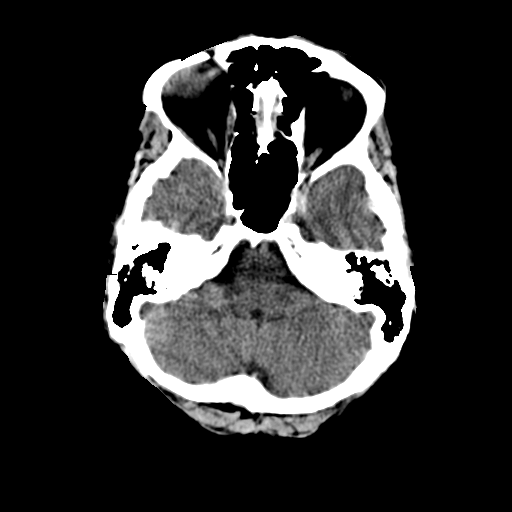
[im 9/36  brain]
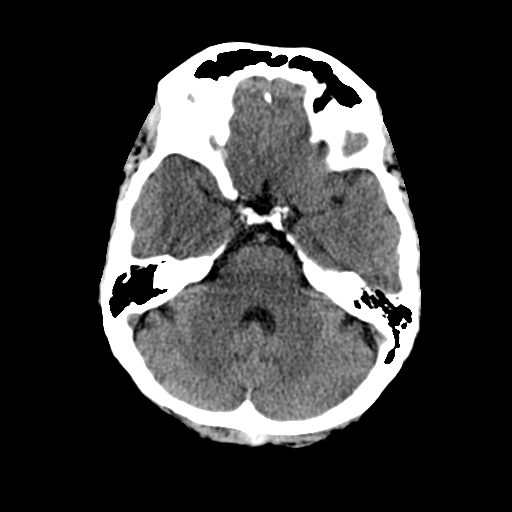
[im 10/36  brain]
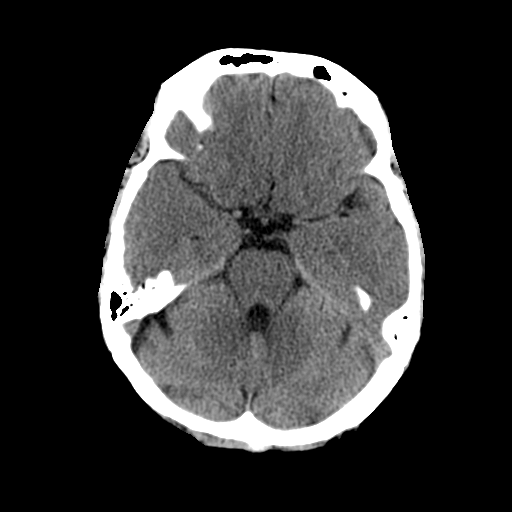
[im 10/36  bone]
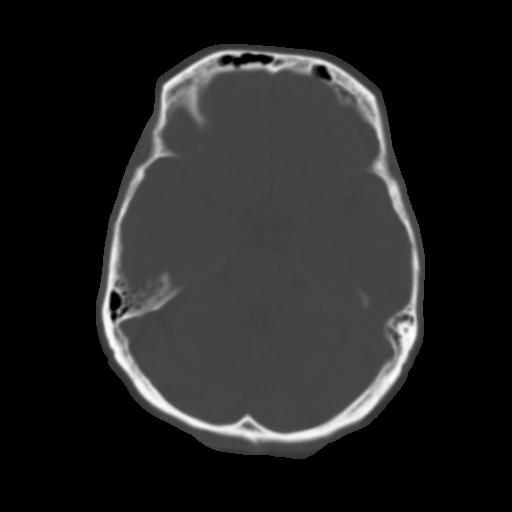
[im 13/36  brain]
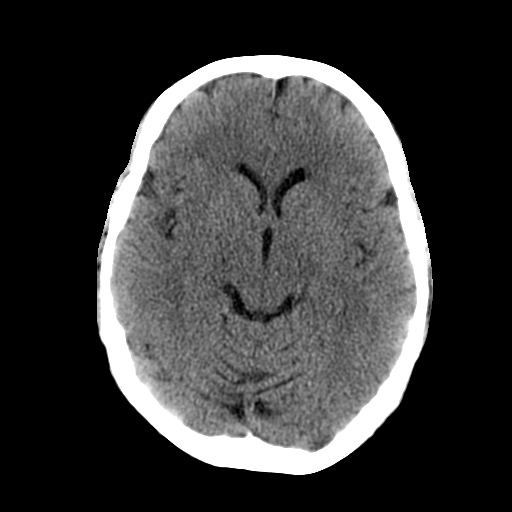
[im 15/36  brain]
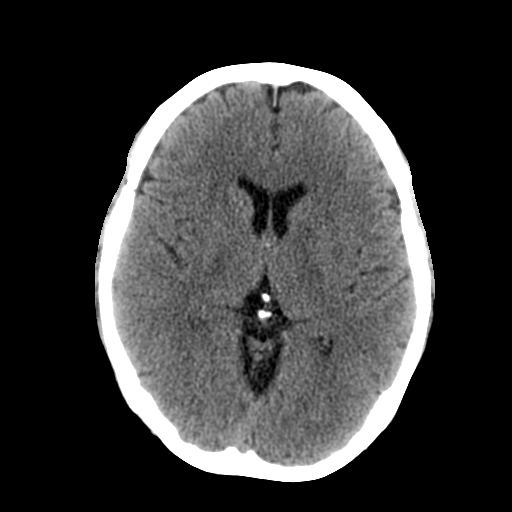
[im 17/36  brain]
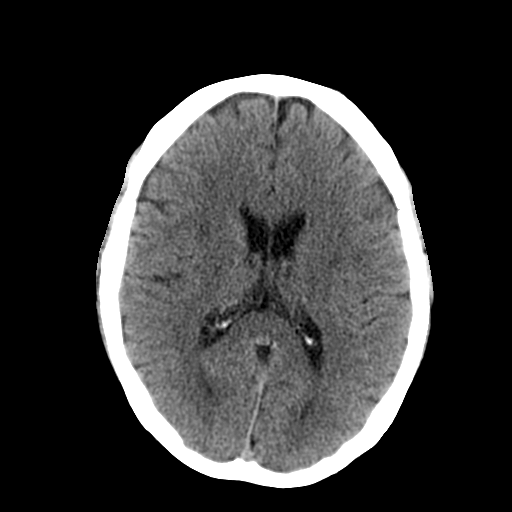
[im 19/36  brain]
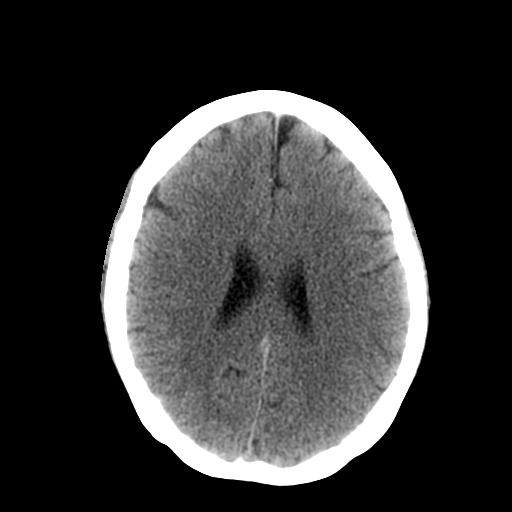
[im 19/36  bone]
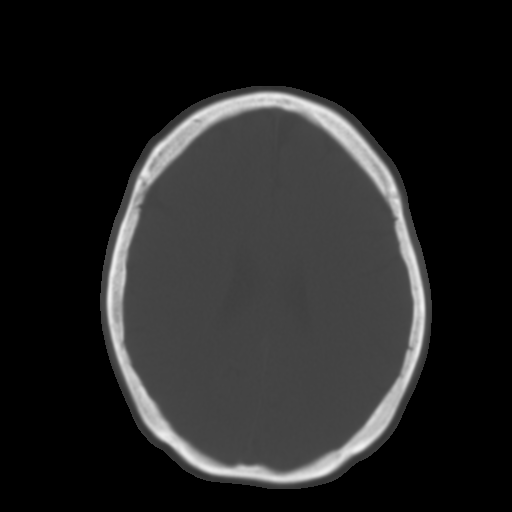
[im 21/36  brain]
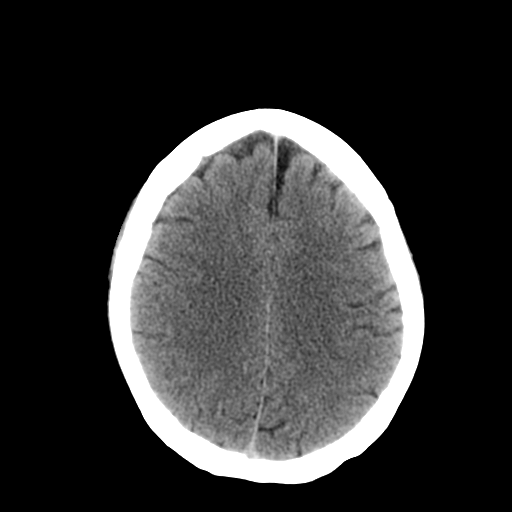
[im 23/36  brain]
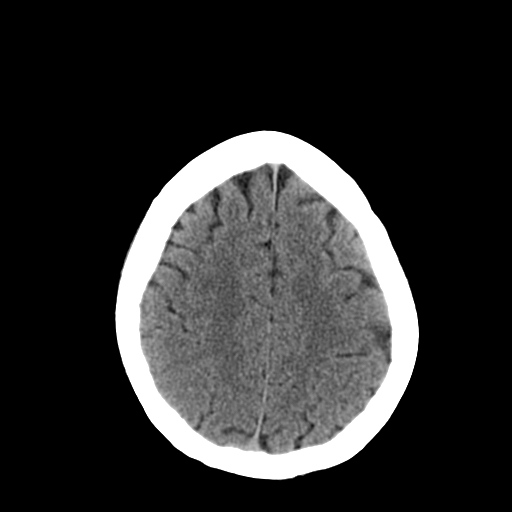
[im 26/36  brain]
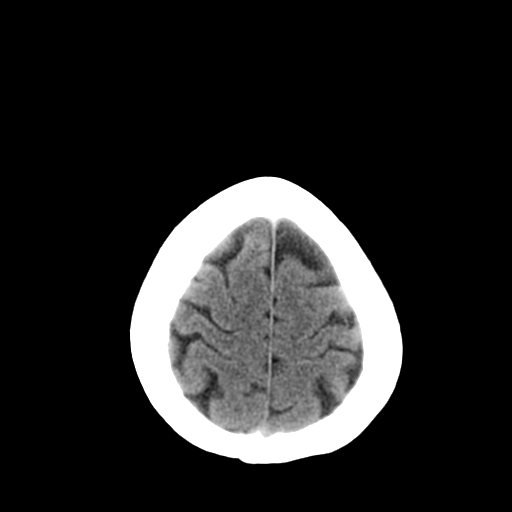
[im 27/36  brain]
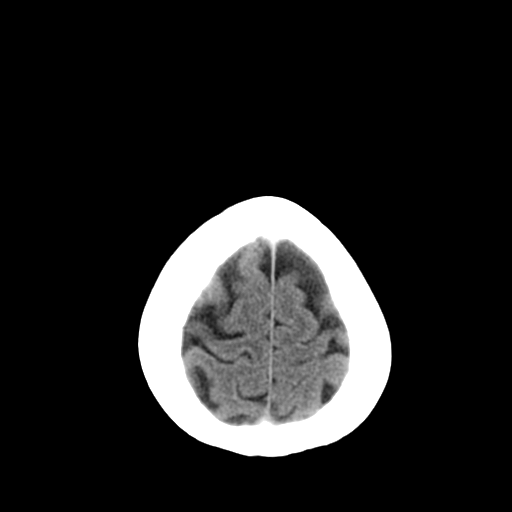
[im 27/36  bone]
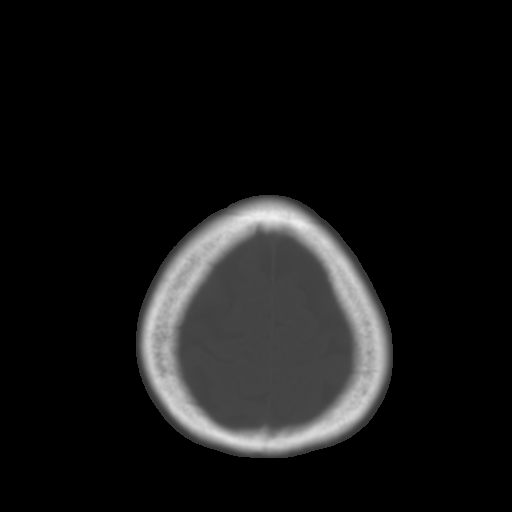
[im 29/36  brain]
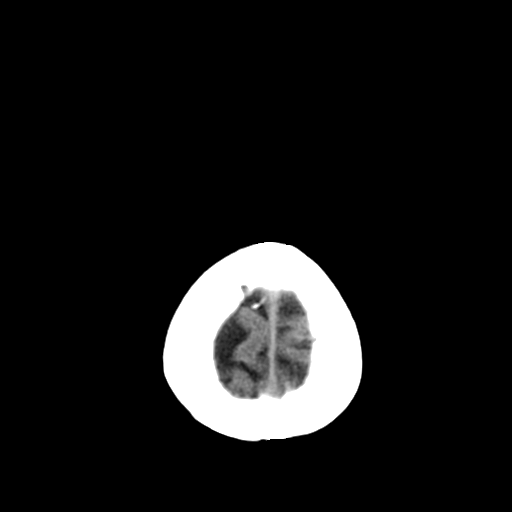
[im 32/36  brain]
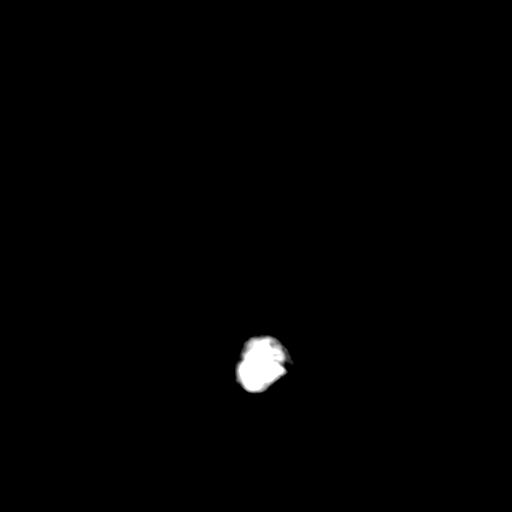
[im 34/36  brain]
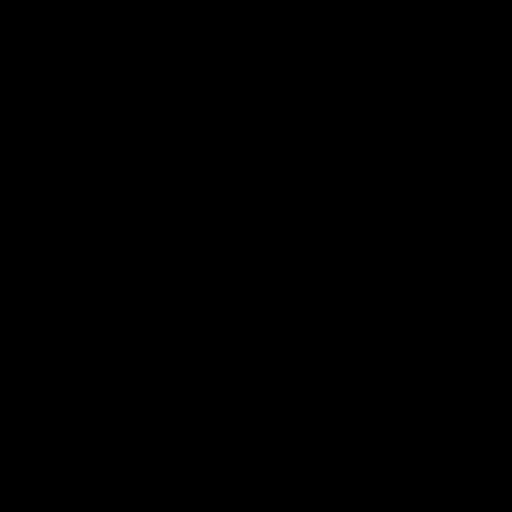

[16 of 30 positions shown; findings below may reference images not displayed]

FINDINGS: Bony calvarium appears intact. Old lacunar infarction is noted in
left basal ganglia. No mass effect or midline shift is noted.
Ventricular size is within normal limits. There is no evidence of
mass lesion, hemorrhage or acute infarction.
IMPRESSION: No acute intracranial abnormality seen.

## 2015-12-06 DIAGNOSIS — Z139 Encounter for screening, unspecified: Secondary | ICD-10-CM | POA: Diagnosis not present

## 2015-12-06 DIAGNOSIS — E782 Mixed hyperlipidemia: Secondary | ICD-10-CM | POA: Diagnosis not present

## 2015-12-06 DIAGNOSIS — E559 Vitamin D deficiency, unspecified: Secondary | ICD-10-CM | POA: Diagnosis not present

## 2015-12-06 DIAGNOSIS — Z79899 Other long term (current) drug therapy: Secondary | ICD-10-CM | POA: Diagnosis not present

## 2015-12-21 DIAGNOSIS — Z23 Encounter for immunization: Secondary | ICD-10-CM | POA: Diagnosis not present

## 2015-12-29 DIAGNOSIS — R7989 Other specified abnormal findings of blood chemistry: Secondary | ICD-10-CM | POA: Diagnosis not present

## 2015-12-29 DIAGNOSIS — E559 Vitamin D deficiency, unspecified: Secondary | ICD-10-CM | POA: Diagnosis not present

## 2015-12-29 DIAGNOSIS — I10 Essential (primary) hypertension: Secondary | ICD-10-CM | POA: Diagnosis not present

## 2015-12-29 DIAGNOSIS — E782 Mixed hyperlipidemia: Secondary | ICD-10-CM | POA: Diagnosis not present

## 2016-02-09 DIAGNOSIS — E559 Vitamin D deficiency, unspecified: Secondary | ICD-10-CM | POA: Diagnosis not present

## 2016-02-09 DIAGNOSIS — R7989 Other specified abnormal findings of blood chemistry: Secondary | ICD-10-CM | POA: Diagnosis not present

## 2016-02-09 DIAGNOSIS — I10 Essential (primary) hypertension: Secondary | ICD-10-CM | POA: Diagnosis not present

## 2016-02-09 DIAGNOSIS — E782 Mixed hyperlipidemia: Secondary | ICD-10-CM | POA: Diagnosis not present

## 2016-02-09 DIAGNOSIS — Z139 Encounter for screening, unspecified: Secondary | ICD-10-CM | POA: Diagnosis not present

## 2016-02-09 DIAGNOSIS — Z008 Encounter for other general examination: Secondary | ICD-10-CM | POA: Diagnosis not present

## 2016-02-23 DIAGNOSIS — Z7189 Other specified counseling: Secondary | ICD-10-CM | POA: Diagnosis not present

## 2016-03-07 DIAGNOSIS — J40 Bronchitis, not specified as acute or chronic: Secondary | ICD-10-CM | POA: Diagnosis not present

## 2016-03-07 DIAGNOSIS — J441 Chronic obstructive pulmonary disease with (acute) exacerbation: Secondary | ICD-10-CM | POA: Diagnosis not present

## 2016-03-07 DIAGNOSIS — R05 Cough: Secondary | ICD-10-CM | POA: Diagnosis not present

## 2016-03-15 DIAGNOSIS — R05 Cough: Secondary | ICD-10-CM | POA: Diagnosis not present

## 2016-05-17 DIAGNOSIS — E782 Mixed hyperlipidemia: Secondary | ICD-10-CM | POA: Diagnosis not present

## 2016-05-17 DIAGNOSIS — Z008 Encounter for other general examination: Secondary | ICD-10-CM | POA: Diagnosis not present

## 2016-05-17 DIAGNOSIS — J309 Allergic rhinitis, unspecified: Secondary | ICD-10-CM | POA: Diagnosis not present

## 2016-05-17 DIAGNOSIS — E559 Vitamin D deficiency, unspecified: Secondary | ICD-10-CM | POA: Diagnosis not present

## 2016-05-17 DIAGNOSIS — Z719 Counseling, unspecified: Secondary | ICD-10-CM | POA: Diagnosis not present

## 2016-05-17 DIAGNOSIS — N3010 Interstitial cystitis (chronic) without hematuria: Secondary | ICD-10-CM | POA: Diagnosis not present

## 2016-05-17 DIAGNOSIS — K219 Gastro-esophageal reflux disease without esophagitis: Secondary | ICD-10-CM | POA: Diagnosis not present

## 2016-05-17 DIAGNOSIS — R7989 Other specified abnormal findings of blood chemistry: Secondary | ICD-10-CM | POA: Diagnosis not present

## 2016-05-17 DIAGNOSIS — M545 Low back pain: Secondary | ICD-10-CM | POA: Diagnosis not present

## 2016-05-30 ENCOUNTER — Encounter: Payer: BLUE CROSS/BLUE SHIELD | Admitting: Women's Health

## 2016-06-01 DIAGNOSIS — R7989 Other specified abnormal findings of blood chemistry: Secondary | ICD-10-CM | POA: Diagnosis not present

## 2016-06-01 DIAGNOSIS — E782 Mixed hyperlipidemia: Secondary | ICD-10-CM | POA: Diagnosis not present

## 2016-06-01 DIAGNOSIS — E559 Vitamin D deficiency, unspecified: Secondary | ICD-10-CM | POA: Diagnosis not present

## 2016-06-01 DIAGNOSIS — R7301 Impaired fasting glucose: Secondary | ICD-10-CM | POA: Diagnosis not present

## 2016-06-01 DIAGNOSIS — I10 Essential (primary) hypertension: Secondary | ICD-10-CM | POA: Diagnosis not present

## 2016-06-27 ENCOUNTER — Encounter: Payer: Self-pay | Admitting: Women's Health

## 2016-06-27 ENCOUNTER — Ambulatory Visit (INDEPENDENT_AMBULATORY_CARE_PROVIDER_SITE_OTHER): Payer: BLUE CROSS/BLUE SHIELD | Admitting: Women's Health

## 2016-06-27 VITALS — BP 140/86 | Ht 65.0 in | Wt 185.0 lb

## 2016-06-27 DIAGNOSIS — Z01419 Encounter for gynecological examination (general) (routine) without abnormal findings: Secondary | ICD-10-CM | POA: Diagnosis not present

## 2016-06-27 DIAGNOSIS — Z1231 Encounter for screening mammogram for malignant neoplasm of breast: Secondary | ICD-10-CM | POA: Diagnosis not present

## 2016-06-27 DIAGNOSIS — Z803 Family history of malignant neoplasm of breast: Secondary | ICD-10-CM | POA: Diagnosis not present

## 2016-06-27 DIAGNOSIS — R350 Frequency of micturition: Secondary | ICD-10-CM

## 2016-06-27 MED ORDER — ESTRADIOL 0.1 MG/GM VA CREA: 1.0000 | TOPICAL_CREAM | VAGINAL | 10 refills | Status: DC

## 2016-06-27 NOTE — Patient Instructions (Signed)
DASH Eating Plan DASH stands for "Dietary Approaches to Stop Hypertension." The DASH eating plan is a healthy eating plan that has been shown to reduce high blood pressure (hypertension). It may also reduce your risk for type 2 diabetes, heart disease, and stroke. The DASH eating plan may also help with weight loss. What are tips for following this plan? General guidelines  Avoid eating more than 2,300 mg (milligrams) of salt (sodium) a day. If you have hypertension, you may need to reduce your sodium intake to 1,500 mg a day.  Limit alcohol intake to no more than 1 drink a day for nonpregnant women and 2 drinks a day for men. One drink equals 12 oz of beer, 5 oz of wine, or 1 oz of hard liquor.  Work with your health care provider to maintain a healthy body weight or to lose weight. Ask what an ideal weight is for you.  Get at least 30 minutes of exercise that causes your heart to beat faster (aerobic exercise) most days of the week. Activities may include walking, swimming, or biking.  Work with your health care provider or diet and nutrition specialist (dietitian) to adjust your eating plan to your individual calorie needs. Reading food labels  Check food labels for the amount of sodium per serving. Choose foods with less than 5 percent of the Daily Value of sodium. Generally, foods with less than 300 mg of sodium per serving fit into this eating plan.  To find whole grains, look for the word "whole" as the first word in the ingredient list. Shopping  Buy products labeled as "low-sodium" or "no salt added."  Buy fresh foods. Avoid canned foods and premade or frozen meals. Cooking  Avoid adding salt when cooking. Use salt-free seasonings or herbs instead of table salt or sea salt. Check with your health care provider or pharmacist before using salt substitutes.  Do not fry foods. Cook foods using healthy methods such as baking, boiling, grilling, and broiling instead.  Cook with  heart-healthy oils, such as olive, canola, soybean, or sunflower oil. Meal planning   Eat a balanced diet that includes: ? 5 or more servings of fruits and vegetables each day. At each meal, try to fill half of your plate with fruits and vegetables. ? Up to 6-8 servings of whole grains each day. ? Less than 6 oz of lean meat, poultry, or fish each day. A 3-oz serving of meat is about the same size as a deck of cards. One egg equals 1 oz. ? 2 servings of low-fat dairy each day. ? A serving of nuts, seeds, or beans 5 times each week. ? Heart-healthy fats. Healthy fats called Omega-3 fatty acids are found in foods such as flaxseeds and coldwater fish, like sardines, salmon, and mackerel.  Limit how much you eat of the following: ? Canned or prepackaged foods. ? Food that is high in trans fat, such as fried foods. ? Food that is high in saturated fat, such as fatty meat. ? Sweets, desserts, sugary drinks, and other foods with added sugar. ? Full-fat dairy products.  Do not salt foods before eating.  Try to eat at least 2 vegetarian meals each week.  Eat more home-cooked food and less restaurant, buffet, and fast food.  When eating at a restaurant, ask that your food be prepared with less salt or no salt, if possible. What foods are recommended? The items listed may not be a complete list. Talk with your dietitian about what   with your dietitian about what dietary choices are best for you. Grains  Whole-grain or whole-wheat bread. Whole-grain or whole-wheat pasta. Brown rice. [place]. Bulgur. Whole-grain and low-sodium cereals. Pita bread. Low-fat, low-sodium crackers. Whole-wheat flour tortillas. Vegetables  Fresh or frozen vegetables (raw, steamed, roasted, or grilled). Low-sodium or reduced-sodium tomato and vegetable juice. Low-sodium or reduced-sodium tomato sauce and tomato paste. Low-sodium or reduced-sodium canned vegetables. Fruits  All fresh, dried, or frozen fruit. Canned fruit in natural juice  (without added sugar). Meat and other protein foods  Skinless chicken or [place]. Ground chicken or [place]. Pork with fat trimmed off. Fish and seafood. Egg whites. Dried beans, peas, or lentils. Unsalted nuts, nut butters, and seeds. Unsalted canned beans. Lean cuts of beef with fat trimmed off. Low-sodium, lean deli meat. Dairy  Low-fat (1%) or fat-free (skim) milk. Fat-free, low-fat, or reduced-fat cheeses. Nonfat, low-sodium ricotta or cottage cheese. Low-fat or nonfat yogurt. Low-fat, low-sodium cheese. Fats and oils  Soft margarine without trans fats. Vegetable oil. Low-fat, reduced-fat, or light mayonnaise and salad dressings (reduced-sodium). Canola, safflower, olive, soybean, and sunflower oils. Avocado. Seasoning and other foods  Herbs. Spices. Seasoning mixes without salt. Unsalted popcorn and pretzels. Fat-free sweets. What foods are not recommended? The items listed may not be a complete list. Talk with your dietitian about what dietary choices are best for you. Grains  Baked goods made with fat, such as croissants, muffins, or some breads. Dry pasta or rice meal packs. Vegetables  Creamed or fried vegetables. Vegetables in a cheese sauce. Regular canned vegetables (not low-sodium or reduced-sodium). Regular canned tomato sauce and paste (not low-sodium or reduced-sodium). Regular tomato and vegetable juice (not low-sodium or reduced-sodium). Angie Fava. Olives. Fruits  Canned fruit in a light or heavy syrup. Fried fruit. Fruit in cream or butter sauce. Meat and other protein foods  Fatty cuts of meat. Ribs. Fried meat. Berniece Salines. Sausage. Bologna and other processed lunch meats. Salami. Fatback. Hotdogs. Bratwurst. Salted nuts and seeds. Canned beans with added salt. Canned or smoked fish. Whole eggs or egg yolks. Chicken or [place] with skin. Dairy  Whole or 2% milk, cream, and half-and-half. Whole or full-fat cream cheese. Whole-fat or sweetened yogurt. Full-fat cheese. Nondairy creamers.  Whipped toppings. Processed cheese and cheese spreads. Fats and oils  Butter. Stick margarine. Lard. Shortening. Ghee. Bacon fat. Tropical oils, such as coconut, palm kernel, or palm oil. Seasoning and other foods  Salted popcorn and pretzels. Onion salt, garlic salt, seasoned salt, table salt, and sea salt. Worcestershire sauce. Tartar sauce. Barbecue sauce. Teriyaki sauce. Soy sauce, including reduced-sodium. Steak sauce. Canned and packaged gravies. Fish sauce. Oyster sauce. Cocktail sauce. Horseradish that you find on the shelf. Ketchup. Mustard. Meat flavorings and tenderizers. Bouillon cubes. Hot sauce and Tabasco sauce. Premade or packaged marinades. Premade or packaged taco seasonings. Relishes. Regular salad dressings. Where to find more information:  National Heart, Lung, and [place]: https://wilson-eaton.com/  American Heart Association: www.heart.org Summary  The DASH eating plan is a healthy eating plan that has been shown to reduce high blood pressure (hypertension). It may also reduce your risk for type 2 diabetes, heart disease, and stroke.  With the DASH eating plan, you should limit salt (sodium) intake to 2,300 mg a day. If you have hypertension, you may need to reduce your sodium intake to 1,500 mg a day.  When on the DASH eating plan, aim to eat more fresh fruits and vegetables, whole grains, lean proteins, low-fat dairy, and heart-healthy fats.  Work  with your health care provider or diet and nutrition specialist (dietitian) to adjust your eating plan to your individual calorie needs. This information is not intended to replace advice given to you by your health care provider. Make sure you discuss any questions you have with your health care provider. Document Released: 02/16/2011 Document Revised: 02/21/2016 Document Reviewed: 02/21/2016 Elsevier Interactive Patient Education  2017 [place] Maintenance for Postmenopausal Women Menopause is a normal process  in which your reproductive ability comes to an end. This process happens gradually over a span of months to years, usually between the ages of 39 and 64. Menopause is complete when you have missed 12 consecutive menstrual periods. It is important to talk with your health care provider about some of the most common conditions that affect postmenopausal women, such as heart disease, cancer, and bone loss (osteoporosis). Adopting a healthy lifestyle and getting preventive care can help to promote your health and wellness. Those actions can also lower your chances of developing some of these common conditions. What should I know about menopause? During menopause, you may experience a number of symptoms, such as:  Moderate-to-severe hot flashes.  Night sweats.  Decrease in sex drive.  Mood swings.  Headaches.  Tiredness.  Irritability.  Memory problems.  Insomnia. Choosing to treat or not to treat menopausal changes is an individual decision that you make with your health care provider. What should I know about hormone replacement therapy and supplements? Hormone therapy products are effective for treating symptoms that are associated with menopause, such as hot flashes and night sweats. Hormone replacement carries certain risks, especially as you become older. If you are thinking about using estrogen or estrogen with progestin treatments, discuss the benefits and risks with your health care provider. What should I know about heart disease and stroke? Heart disease, heart attack, and stroke become more likely as you age. This may be due, in part, to the hormonal changes that your body experiences during menopause. These can affect how your body processes dietary fats, triglycerides, and cholesterol. Heart attack and stroke are both medical emergencies. There are many things that you can do to help prevent heart disease and stroke:  Have your blood pressure checked at least every 1-2 years.  High blood pressure causes heart disease and increases the risk of stroke.  If you are 92-53 years old, ask your health care provider if you should take aspirin to prevent a heart attack or a stroke.  Do not use any tobacco products, including cigarettes, chewing tobacco, or electronic cigarettes. If you need help quitting, ask your health care provider.  It is important to eat a healthy diet and maintain a healthy weight.  Be sure to include plenty of vegetables, fruits, low-fat dairy products, and lean protein.  Avoid eating foods that are high in solid fats, added sugars, or salt (sodium).  Get regular exercise. This is one of the most important things that you can do for your health.  Try to exercise for at least 150 minutes each week. The type of exercise that you do should increase your heart rate and make you sweat. This is known as moderate-intensity exercise.  Try to do strengthening exercises at least twice each week. Do these in addition to the moderate-intensity exercise.  Know your numbers.Ask your health care provider to check your cholesterol and your blood glucose. Continue to have your blood tested as directed by your health care provider. What should I know about cancer screening? There  are several types of cancer. Take the following steps to reduce your risk and to catch any cancer development as early as possible. Breast Cancer  Practice breast self-awareness.  This means understanding how your breasts normally appear and feel.  It also means doing regular breast self-exams. Let your health care provider know about any changes, no matter how small.  If you are 40 or older, have a clinician do a breast exam (clinical breast exam or CBE) every year. Depending on your age, family history, and medical history, it may be recommended that you also have a yearly breast X-ray (mammogram).  If you have a family history of breast cancer, talk with your health care provider  about genetic screening.  If you are at high risk for breast cancer, talk with your health care provider about having an MRI and a mammogram every year.  Breast cancer (BRCA) gene test is recommended for women who have family members with BRCA-related cancers. Results of the assessment will determine the need for genetic counseling and BRCA1 and for BRCA2 testing. BRCA-related cancers include these types:  Breast. This occurs in males or females.  Ovarian.  Tubal. This may also be called fallopian tube cancer.  Cancer of the abdominal or pelvic lining (peritoneal cancer).  Prostate.  Pancreatic. Cervical, Uterine, and Ovarian Cancer  Your health care provider may recommend that you be screened regularly for cancer of the pelvic organs. These include your ovaries, uterus, and vagina. This screening involves a pelvic exam, which includes checking for microscopic changes to the surface of your cervix (Pap test).  For women ages 21-65, health care providers may recommend a pelvic exam and a Pap test every three years. For women ages 30-65, they may recommend the Pap test and pelvic exam, combined with testing for human papilloma virus (HPV), every five years. Some types of HPV increase your risk of cervical cancer. Testing for HPV may also be done on women of any age who have unclear Pap test results.  Other health care providers may not recommend any screening for nonpregnant women who are considered low risk for pelvic cancer and have no symptoms. Ask your health care provider if a screening pelvic exam is right for you.  If you have had past treatment for cervical cancer or a condition that could lead to cancer, you need Pap tests and screening for cancer for at least 20 years after your treatment. If Pap tests have been discontinued for you, your risk factors (such as having a new sexual partner) need to be reassessed to determine if you should start having screenings again. Some women have  medical problems that increase the chance of getting cervical cancer. In these cases, your health care provider may recommend that you have screening and Pap tests more often.  If you have a family history of uterine cancer or ovarian cancer, talk with your health care provider about genetic screening.  If you have vaginal bleeding after reaching menopause, tell your health care provider.  There are currently no reliable tests available to screen for ovarian cancer. Lung Cancer  Lung cancer screening is recommended for adults 55-80 years old who are at high risk for lung cancer because of a history of smoking. A yearly low-dose CT scan of the lungs is recommended if you:  Currently smoke.  Have a history of at least 30 pack-years of smoking and you currently smoke or have quit within the past 15 years. A pack-year is smoking an average   of one pack of cigarettes per day for one year. Yearly screening should:  Continue until it has been 15 years since you quit.  Stop if you develop a health problem that would prevent you from having lung cancer treatment. Colorectal Cancer  This type of cancer can be detected and can often be prevented.  Routine colorectal cancer screening usually begins at age 50 and continues through age 75.  If you have risk factors for colon cancer, your health care provider may recommend that you be screened at an earlier age.  If you have a family history of colorectal cancer, talk with your health care provider about genetic screening.  Your health care provider may also recommend using home test kits to check for hidden blood in your stool.  A small camera at the end of a tube can be used to examine your colon directly (sigmoidoscopy or colonoscopy). This is done to check for the earliest forms of colorectal cancer.  Direct examination of the colon should be repeated every 5-10 years until age 75. However, if early forms of precancerous polyps or small growths  are found or if you have a family history or genetic risk for colorectal cancer, you may need to be screened more often. Skin Cancer  Check your skin from head to toe regularly.  Monitor any moles. Be sure to tell your health care provider:  About any new moles or changes in moles, especially if there is a change in a mole's shape or color.  If you have a mole that is larger than the size of a pencil eraser.  If any of your family members has a history of skin cancer, especially at a [place] age, talk with your health care provider about genetic screening.  Always use sunscreen. Apply sunscreen liberally and repeatedly throughout the day.  Whenever you are outside, protect yourself by wearing long sleeves, pants, a wide-brimmed hat, and sunglasses. What should I know about osteoporosis? Osteoporosis is a condition in which bone destruction happens more quickly than new bone creation. After menopause, you may be at an increased risk for osteoporosis. To help prevent osteoporosis or the bone fractures that can happen because of osteoporosis, the following is recommended:  If you are 19-50 years old, get at least 1,000 mg of calcium and at least 600 mg of vitamin D per day.  If you are older than age 50 but younger than age 70, get at least 1,200 mg of calcium and at least 600 mg of vitamin D per day.  If you are older than age 70, get at least 1,200 mg of calcium and at least 800 mg of vitamin D per day. Smoking and excessive alcohol intake increase the risk of osteoporosis. Eat foods that are rich in calcium and vitamin D, and do weight-bearing exercises several times each week as directed by your health care provider. What should I know about how menopause affects my mental health? Depression may occur at any age, but it is more common as you become older. Common symptoms of depression include:  Low or sad mood.  Changes in sleep patterns.  Changes in appetite or eating  patterns.  Feeling an overall lack of motivation or enjoyment of activities that you previously enjoyed.  Frequent crying spells. Talk with your health care provider if you think that you are experiencing depression. What should I know about immunizations? It is important that you get and maintain your immunizations. These include:  Tetanus, diphtheria, and pertussis (  Tdap) booster vaccine.  Influenza every year before the flu season begins.  Pneumonia vaccine.  Shingles vaccine. Your health care provider may also recommend other immunizations. This information is not intended to replace advice given to you by your health care provider. Make sure you discuss any questions you have with your health care provider. Document Released: 04/21/2005 Document Revised: 09/17/2015 Document Reviewed: 12/01/2014 Elsevier Interactive Patient Education  2017 [employer].

## 2016-06-27 NOTE — Progress Notes (Signed)
Shelley [name] [birth year] [id number]    History:    Presents for annual exam. Supracervical TAH with BSO for fibroids and endometriosis on vaginal estrogen cream. Rare sexual activity same partner. Normal Pap and mammogram history. Reports normal DEXA at work. 2016 negative colonoscopy. Has had both Zostavax and Pneumovax. Mother history of breast cancer at age 59, colon cancer age 41 survivor. Hypertension, GERD, hypercholesterolemia managed by primary care.  Past medical history, past surgical history, family history and social history were all reviewed and documented in the EPIC chart. Works in [employer] at [employer] 5 AM part-time at [employer]. Daughter completing PhD, teacher.  ROS:  A ROS was performed and pertinent positives and negatives are included.  Exam:  Vitals:   06/27/16 1032  BP: 140/86  Weight: 185 lb (83.9 kg)  Height: 5\' 5"  (1.651 m)   Body mass index is 30.79 kg/m.   General appearance:  Normal Thyroid:  Symmetrical, normal in size, without palpable masses or nodularity. Respiratory  Auscultation:  Clear without wheezing or rhonchi Cardiovascular  Auscultation:  Regular rate, without rubs, murmurs or gallops  Edema/varicosities:  Not grossly evident Abdominal  Soft,nontender, without masses, guarding or rebound.  Liver/spleen:  No organomegaly noted  Hernia:  None appreciated  Skin  Inspection:  Grossly normal   Breasts: Examined lying and sitting.     Right: Without masses, retractions, discharge or axillary adenopathy.     Left: Without masses, retractions, discharge or axillary adenopathy. Gentitourinary   Inguinal/mons:  Normal without inguinal adenopathy  External genitalia:  Normal  BUS/Urethra/Skene's glands:  Normal  Vagina:  Normal  Cervix: Normal  Uterus:  Absent  Adnexa/parametria:     Rt: Without masses or tenderness.   Lt: Without masses or tenderness.  Anus and perineum: Normal  Digital rectal exam: Normal sphincter tone without palpated masses or  tenderness  Assessment/Plan:  61 y.o. S WF G2 P1  for annual exam with no complaints.  Supracervical TAH with BSO for fibroids and endometriosis on estrogen cream Hypertension/GERD/hypercholesterolemia-primary care manages labs and meds Obesity  Plan: Estrace 0.1 vaginal cream use a small amount 2-3 times weekly externally. Reviewed minimal systemic absorption prescription, proper use given and reviewed. SBE's, continue annual screening mammogram, calcium rich diet, vitamin D 2000 daily encouraged. Home safety, fall prevention and importance of weightbearing exercise reviewed. Continue Weight Watchers has lost 10 pounds since starting. DEXA at work have reports sent to our office. Pap normal 2017, new screening guidelines reviewed.    [name] [facility], 1:15 PM 06/27/2016

## 2016-06-28 LAB — URINALYSIS W MICROSCOPIC + REFLEX CULTURE
BILIRUBIN URINE: NEGATIVE
Bacteria, UA: NONE SEEN [HPF]
Casts: NONE SEEN [LPF]
Crystals: NONE SEEN [HPF]
GLUCOSE, UA: NEGATIVE
Hgb urine dipstick: NEGATIVE
Ketones, ur: NEGATIVE
Leukocytes, UA: NEGATIVE
Nitrite: NEGATIVE
Protein, ur: NEGATIVE
RBC / HPF: NONE SEEN RBC/HPF (ref <=2)
SPECIFIC GRAVITY, URINE: 1.008 (ref 1.001–1.035)
Squamous Epithelial / LPF: NONE SEEN [HPF] (ref <=5)
WBC UA: NONE SEEN WBC/HPF (ref <=5)
Yeast: NONE SEEN [HPF]
pH: 7 (ref 5.0–8.0)

## 2016-07-17 DIAGNOSIS — R7301 Impaired fasting glucose: Secondary | ICD-10-CM | POA: Diagnosis not present

## 2016-07-17 DIAGNOSIS — Z79899 Other long term (current) drug therapy: Secondary | ICD-10-CM | POA: Diagnosis not present

## 2016-07-17 DIAGNOSIS — E782 Mixed hyperlipidemia: Secondary | ICD-10-CM | POA: Diagnosis not present

## 2016-07-27 DIAGNOSIS — E663 Overweight: Secondary | ICD-10-CM | POA: Diagnosis not present

## 2016-07-27 DIAGNOSIS — Z008 Encounter for other general examination: Secondary | ICD-10-CM | POA: Diagnosis not present

## 2016-07-27 DIAGNOSIS — Z719 Counseling, unspecified: Secondary | ICD-10-CM | POA: Diagnosis not present

## 2016-07-27 DIAGNOSIS — E782 Mixed hyperlipidemia: Secondary | ICD-10-CM | POA: Diagnosis not present

## 2016-07-27 DIAGNOSIS — E559 Vitamin D deficiency, unspecified: Secondary | ICD-10-CM | POA: Diagnosis not present

## 2016-07-27 DIAGNOSIS — I10 Essential (primary) hypertension: Secondary | ICD-10-CM | POA: Diagnosis not present

## 2016-10-09 DIAGNOSIS — I10 Essential (primary) hypertension: Secondary | ICD-10-CM | POA: Diagnosis not present

## 2016-10-09 DIAGNOSIS — E782 Mixed hyperlipidemia: Secondary | ICD-10-CM | POA: Diagnosis not present

## 2016-10-09 DIAGNOSIS — E559 Vitamin D deficiency, unspecified: Secondary | ICD-10-CM | POA: Diagnosis not present

## 2016-10-09 DIAGNOSIS — N3010 Interstitial cystitis (chronic) without hematuria: Secondary | ICD-10-CM | POA: Diagnosis not present

## 2016-10-09 DIAGNOSIS — M545 Low back pain: Secondary | ICD-10-CM | POA: Diagnosis not present

## 2016-10-09 DIAGNOSIS — K219 Gastro-esophageal reflux disease without esophagitis: Secondary | ICD-10-CM | POA: Diagnosis not present

## 2016-10-09 DIAGNOSIS — Z008 Encounter for other general examination: Secondary | ICD-10-CM | POA: Diagnosis not present

## 2016-10-09 DIAGNOSIS — E663 Overweight: Secondary | ICD-10-CM | POA: Diagnosis not present

## 2016-10-09 DIAGNOSIS — J309 Allergic rhinitis, unspecified: Secondary | ICD-10-CM | POA: Diagnosis not present

## 2016-11-21 DIAGNOSIS — I10 Essential (primary) hypertension: Secondary | ICD-10-CM | POA: Diagnosis not present

## 2016-11-21 DIAGNOSIS — Z1389 Encounter for screening for other disorder: Secondary | ICD-10-CM | POA: Diagnosis not present

## 2016-11-21 DIAGNOSIS — E663 Overweight: Secondary | ICD-10-CM | POA: Diagnosis not present

## 2016-11-21 DIAGNOSIS — Z6828 Body mass index (BMI) 28.0-28.9, adult: Secondary | ICD-10-CM | POA: Diagnosis not present

## 2016-11-21 DIAGNOSIS — K449 Diaphragmatic hernia without obstruction or gangrene: Secondary | ICD-10-CM | POA: Diagnosis not present

## 2016-11-21 DIAGNOSIS — F419 Anxiety disorder, unspecified: Secondary | ICD-10-CM | POA: Diagnosis not present

## 2016-11-21 DIAGNOSIS — K219 Gastro-esophageal reflux disease without esophagitis: Secondary | ICD-10-CM | POA: Diagnosis not present

## 2016-11-27 DIAGNOSIS — R7301 Impaired fasting glucose: Secondary | ICD-10-CM | POA: Diagnosis not present

## 2016-11-27 DIAGNOSIS — Z79899 Other long term (current) drug therapy: Secondary | ICD-10-CM | POA: Diagnosis not present

## 2016-11-27 DIAGNOSIS — E782 Mixed hyperlipidemia: Secondary | ICD-10-CM | POA: Diagnosis not present

## 2016-11-27 DIAGNOSIS — Z139 Encounter for screening, unspecified: Secondary | ICD-10-CM | POA: Diagnosis not present

## 2016-12-11 DIAGNOSIS — E663 Overweight: Secondary | ICD-10-CM | POA: Diagnosis not present

## 2016-12-11 DIAGNOSIS — E559 Vitamin D deficiency, unspecified: Secondary | ICD-10-CM | POA: Diagnosis not present

## 2016-12-11 DIAGNOSIS — I10 Essential (primary) hypertension: Secondary | ICD-10-CM | POA: Diagnosis not present

## 2016-12-11 DIAGNOSIS — E782 Mixed hyperlipidemia: Secondary | ICD-10-CM | POA: Diagnosis not present

## 2016-12-11 DIAGNOSIS — Z008 Encounter for other general examination: Secondary | ICD-10-CM | POA: Diagnosis not present

## 2016-12-12 DIAGNOSIS — Z1389 Encounter for screening for other disorder: Secondary | ICD-10-CM | POA: Diagnosis not present

## 2016-12-12 DIAGNOSIS — K219 Gastro-esophageal reflux disease without esophagitis: Secondary | ICD-10-CM | POA: Diagnosis not present

## 2017-01-11 DIAGNOSIS — L309 Dermatitis, unspecified: Secondary | ICD-10-CM | POA: Diagnosis not present

## 2017-01-11 DIAGNOSIS — I10 Essential (primary) hypertension: Secondary | ICD-10-CM | POA: Diagnosis not present

## 2017-01-11 DIAGNOSIS — Z6828 Body mass index (BMI) 28.0-28.9, adult: Secondary | ICD-10-CM | POA: Diagnosis not present

## 2017-01-11 DIAGNOSIS — N3010 Interstitial cystitis (chronic) without hematuria: Secondary | ICD-10-CM | POA: Diagnosis not present

## 2017-01-11 DIAGNOSIS — Z0001 Encounter for general adult medical examination with abnormal findings: Secondary | ICD-10-CM | POA: Diagnosis not present

## 2017-01-11 DIAGNOSIS — E782 Mixed hyperlipidemia: Secondary | ICD-10-CM | POA: Diagnosis not present

## 2017-01-11 DIAGNOSIS — M797 Fibromyalgia: Secondary | ICD-10-CM | POA: Diagnosis not present

## 2017-01-11 DIAGNOSIS — Z1389 Encounter for screening for other disorder: Secondary | ICD-10-CM | POA: Diagnosis not present

## 2017-01-11 DIAGNOSIS — K219 Gastro-esophageal reflux disease without esophagitis: Secondary | ICD-10-CM | POA: Diagnosis not present

## 2017-01-11 DIAGNOSIS — J301 Allergic rhinitis due to pollen: Secondary | ICD-10-CM | POA: Diagnosis not present

## 2017-01-17 ENCOUNTER — Ambulatory Visit: Payer: BLUE CROSS/BLUE SHIELD | Admitting: Orthopedic Surgery

## 2017-01-17 ENCOUNTER — Encounter: Payer: Self-pay | Admitting: Orthopedic Surgery

## 2017-01-17 ENCOUNTER — Ambulatory Visit (INDEPENDENT_AMBULATORY_CARE_PROVIDER_SITE_OTHER): Payer: BLUE CROSS/BLUE SHIELD

## 2017-01-17 VITALS — BP 148/84 | HR 72 | Ht 65.0 in | Wt 176.0 lb

## 2017-01-17 DIAGNOSIS — M654 Radial styloid tenosynovitis [de Quervain]: Secondary | ICD-10-CM

## 2017-01-17 DIAGNOSIS — M25531 Pain in right wrist: Secondary | ICD-10-CM | POA: Diagnosis not present

## 2017-01-17 MED ORDER — DICLOFENAC SODIUM 1 % TD GEL: 2.0000 g | Freq: Four times a day (QID) | TRANSDERMAL | 5 refills | Status: DC

## 2017-01-17 NOTE — Progress Notes (Signed)
NEW PATIENT OFFICE VISIT    Chief Complaint  Patient presents with  . Wrist Pain    right wrist pops has been painful NKI     61 year old female presents with atraumatic onset of pain in the right wrist.  The patient reports no trauma but she does report a 3-week history of pain popping clicking and pain with ulnar deviation with pain located over the right first extensor compartment  IT IS A DULL ACHING PAIN WHICH IS CONSTANT     Review of Systems  Constitutional: Negative.   Skin: Negative.   Neurological: Negative for tingling and sensory change.     Past Medical History:  Diagnosis Date  . High cholesterol   . Mitral valve disease   . Palpitations     Past Surgical History:  Procedure Laterality Date  . Childbirth    . COLONOSCOPY    . TOTAL ABDOMINAL HYSTERECTOMY W/ BILATERAL SALPINGOOPHORECTOMY    . UPPER GASTROINTESTINAL ENDOSCOPY      Family History  Problem Relation Age of Onset  . Hyperlipidemia Mother   . Hypertension Mother   . Breast cancer Mother 57  . Colon cancer Mother 3  . Hyperlipidemia Father        History of coronary artery by  . Bladder Cancer Father   . Breast cancer Paternal Aunt        After age 9  . Breast cancer Paternal Aunt        After age 51   Social History   Tobacco Use  . Smoking status: Former Smoker    Types: Cigarettes    Last attempt to quit: 11/26/2010    Years since quitting: 6.1  . Smokeless tobacco: Never Used  . Tobacco comment: 5 ciggs per week  Substance Use Topics  . Alcohol use: No  . Drug use: No     Current Meds  Medication Sig  . amitriptyline (ELAVIL) 25 MG tablet Take 12.5 mg by mouth at bedtime.  Marland Kitchen aspirin EC 81 MG tablet Take 81 mg by mouth at bedtime.  . bisoprolol-hydrochlorothiazide (ZIAC) 5-6.25 MG per tablet Take 0.5 tablets by mouth 2 (two) times daily.  Marland Kitchen CINNAMON PO Take by mouth.  . diltiazem (CARTIA XT) 180 MG 24 hr capsule Take 1 capsule (180 mg total) by mouth daily.  Marland Kitchen  estradiol (ESTRACE) 0.1 MG/GM vaginal cream Place 1 Applicatorful vaginally 2 (two) times a week.  . fenofibrate 160 MG tablet Take 160 mg by mouth every morning.  . lansoprazole (PREVACID) 30 MG capsule Take 30 mg by mouth 2 (two) times daily before a meal. rx strength in am otc pm (45mg /daily)  . metaxalone (SKELAXIN) 800 MG tablet Take 400 mg by mouth daily as needed for muscle spasms.   . milk thistle 175 MG tablet Take 175 mg by mouth daily.  . montelukast (SINGULAIR) 10 MG tablet Take 10 mg by mouth every morning.   . Multiple Vitamins-Minerals (MULTIVITAMIN ADULTS 50+) TABS Take by mouth.  . rosuvastatin (CRESTOR) 40 MG tablet Take 10 mg by mouth every evening.  . TURMERIC PO Take by mouth.  . Vitamin D, Ergocalciferol, (DRISDOL) 50000 UNITS CAPS capsule Take 50,000 Units by mouth every 7 (seven) days.    BP (!) 148/84   Pulse 72   Ht 5\' 5"  (1.651 m)   Wt 176 lb (79.8 kg)   BMI 29.29 kg/m   Physical Exam  Constitutional: She is oriented to person, place, and time. She appears well-developed and  well-nourished. No distress.  Musculoskeletal:       Right wrist: She exhibits decreased range of motion, tenderness, bony tenderness and crepitus. She exhibits no swelling, no effusion, no deformity and no laceration.       Arms: Lymphadenopathy:       Right: No epitrochlear adenopathy present.       Left: No epitrochlear adenopathy present.  Neurological: She is alert and oriented to person, place, and time.  Skin: Skin is warm and dry. She is not diaphoretic.  Nursing note and vitals reviewed.   Ortho Exam Normal gait  Right shoulder and elbow normal range of motion no swelling or tenderness no deformity Meds ordered this encounter  Medications  . diclofenac sodium (VOLTAREN) 1 % GEL    Sig: Apply 2 g 4 (four) times daily topically.    Dispense:  3 Tube    Refill:  5    Encounter Diagnoses  Name Primary?  . Right wrist pain   . De Quervain's disease (radial styloid  tenosynovitis) Yes   X-ray was obtained normal right wrist x-ray please see dictated report  PLAN:   The patient has de Quervain's tenosynovitis of the right wrist.  While she would benefit from NSAID therapy she has severe reflux and she has had difficulty with liver function tests increasing when on NSAIDs so she is not a candidate for that and she needs to have a topical medication.  She needs splinting ice and therapeutic exercises at home  Follow-up in 6 weeks

## 2017-01-17 NOTE — Patient Instructions (Addendum)
Ice 20 min every night   Wear brace for 6 weeks   De Quervain Tenosynovitis Tendons attach muscles to bones. They also help with joint movements. When tendons become irritated or swollen, it is called tendinitis. The extensor pollicis brevis (EPB) tendon connects the EPB muscle to a bone that is near the base of the thumb. The EPB muscle helps to straighten and extend the thumb. De Quervain tenosynovitis is a condition in which the EPB tendon lining (sheath) becomes irritated, thickened, and swollen. This condition is sometimes called stenosing tenosynovitis. This condition causes pain on the thumb side of the back of the wrist. What are the causes? Causes of this condition include:  Activities that repeatedly cause your thumb and wrist to extend.  A sudden increase in activity or change in activity that affects your wrist.  What increases the risk? This condition is more likely to develop in:  Females.  People who have diabetes.  Women who have recently given birth.  People who are over 53 years of age.  People who do activities that involve repeated hand and wrist motions, such as tennis, racquetball, volleyball, gardening, and taking care of children.  People who do heavy labor.  People who have poor wrist strength and flexibility.  People who do not warm up properly before activities.  What are the signs or symptoms? Symptoms of this condition include:  Pain or tenderness over the thumb side of the back of the wrist when your thumb and wrist are not moving.  Pain that gets worse when you straighten your thumb or extend your thumb or wrist.  Pain when the injured area is touched.  Locking or catching of the thumb joint while you bend and straighten your thumb.  Decreased thumb motion due to pain.  Swelling over the affected area.  How is this diagnosed? This condition is diagnosed with a medical history and physical exam. Your health care provider will ask for  details about your injury and ask about your symptoms. How is this treated? Treatment may include the use of icing and medicines to reduce pain and swelling. You may also be advised to wear a splint or brace to limit your thumb and wrist motion. In less severe cases, treatment may also include working with a physical therapist to strengthen your wrist and calm the irritation around your EPB tendon sheath. In severe cases, surgery may be needed. Follow these instructions at home: If you have a splint or brace:  Wear it as told by your health care provider. Remove it only as told by your health care provider.  Loosen the splint or brace if your fingers become numb and tingle, or if they turn cold and blue.  Keep the splint or brace clean and dry. Managing pain, stiffness, and swelling  If directed, apply ice to the injured area. ? Put ice in a plastic bag. ? Place a towel between your skin and the bag. ? Leave the ice on for 20 minutes, 2-3 times per day.  Move your fingers often to avoid stiffness and to lessen swelling.  Raise (elevate) the injured area above the level of your heart while you are sitting or lying down. General instructions  Return to your normal activities as told by your health care provider. Ask your health care provider what activities are safe for you.  Take over-the-counter and prescription medicines only as told by your health care provider.  Keep all follow-up visits as told by your health care  provider. This is important.  Do not drive or operate heavy machinery while taking prescription pain medicine. Contact a health care provider if:  Your pain, tenderness, or swelling gets worse, even if you have had treatment.  You have numbness or tingling in your wrist, hand, or fingers on the injured side. This information is not intended to replace advice given to you by your health care provider. Make sure you discuss any questions you have with your health care  provider. Document Released: 02/27/2005 Document Revised: 08/05/2015 Document Reviewed: 05/05/2014 Elsevier Interactive Patient Education  [employer].

## 2017-02-28 ENCOUNTER — Ambulatory Visit: Payer: BLUE CROSS/BLUE SHIELD | Admitting: Orthopedic Surgery

## 2017-02-28 ENCOUNTER — Encounter: Payer: Self-pay | Admitting: Orthopedic Surgery

## 2017-02-28 VITALS — BP 137/80 | HR 74 | Ht 65.0 in | Wt 179.0 lb

## 2017-02-28 DIAGNOSIS — M654 Radial styloid tenosynovitis [de Quervain]: Secondary | ICD-10-CM

## 2017-02-28 NOTE — Progress Notes (Signed)
Chief Complaint  Patient presents with  . Follow-up    Recheck on right wrist    Her veins disease right wrist complains of clicking prior treatment for de Quervain's tenosynovitis with splinting and medication  Complains of clicking but the pain is better  She has a negative Wynn Maudlin sign now but the clicking is palpable over the first extensor compartment  She is going to take her brace off as she only has pain in the morning and once things get going she is pain-free will call us if her pain gets worse

## 2017-04-23 DIAGNOSIS — Z79899 Other long term (current) drug therapy: Secondary | ICD-10-CM | POA: Diagnosis not present

## 2017-04-23 DIAGNOSIS — Z139 Encounter for screening, unspecified: Secondary | ICD-10-CM | POA: Diagnosis not present

## 2017-04-23 DIAGNOSIS — E782 Mixed hyperlipidemia: Secondary | ICD-10-CM | POA: Diagnosis not present

## 2017-04-23 DIAGNOSIS — E559 Vitamin D deficiency, unspecified: Secondary | ICD-10-CM | POA: Diagnosis not present

## 2017-06-01 DIAGNOSIS — L989 Disorder of the skin and subcutaneous tissue, unspecified: Secondary | ICD-10-CM | POA: Diagnosis not present

## 2017-06-01 DIAGNOSIS — R21 Rash and other nonspecific skin eruption: Secondary | ICD-10-CM | POA: Diagnosis not present

## 2017-06-21 ENCOUNTER — Ambulatory Visit: Payer: BLUE CROSS/BLUE SHIELD | Admitting: Podiatry

## 2017-06-26 ENCOUNTER — Encounter: Payer: Self-pay | Admitting: Podiatry

## 2017-06-26 ENCOUNTER — Ambulatory Visit: Payer: BLUE CROSS/BLUE SHIELD | Admitting: Podiatry

## 2017-06-26 ENCOUNTER — Ambulatory Visit (INDEPENDENT_AMBULATORY_CARE_PROVIDER_SITE_OTHER): Payer: BLUE CROSS/BLUE SHIELD

## 2017-06-26 VITALS — BP 153/90 | HR 78 | Resp 16

## 2017-06-26 DIAGNOSIS — M722 Plantar fascial fibromatosis: Secondary | ICD-10-CM

## 2017-06-26 NOTE — Patient Instructions (Signed)
Pre-Operative Instructions  Congratulations, you have decided to take an important step towards improving your quality of life.  You can be assured that the doctors and staff at [facility] will be with you every step of the way.  Here are some important things you should know:  1. Plan to be at the surgery center/hospital at least 1 (one) hour prior to your scheduled time, unless otherwise directed by the surgical center/hospital staff.  You must have a responsible adult accompany you, remain during the surgery and drive you home.  Make sure you have directions to the surgical center/hospital to ensure you arrive on time. 2. If you are having surgery at [facility] or [facility], you will need a copy of your medical history and physical form from your family physician within one month prior to the date of surgery. We will give you a form for your primary physician to complete.  3. We make every effort to accommodate the date you request for surgery.  However, there are times where surgery dates or times have to be moved.  We will contact you as soon as possible if a change in schedule is required.   4. No aspirin/ibuprofen for one week before surgery.  If you are on aspirin, any non-steroidal anti-inflammatory medications (Mobic, Aleve, Ibuprofen) should not be taken seven (7) days prior to your surgery.  You make take Tylenol for pain prior to surgery.  5. Medications - If you are taking daily heart and blood pressure medications, seizure, reflux, allergy, asthma, anxiety, pain or diabetes medications, make sure you notify the surgery center/hospital before the day of surgery so they can tell you which medications you should take or avoid the day of surgery. 6. No food or drink after midnight the night before surgery unless directed otherwise by surgical center/hospital staff. 7. No alcoholic beverages 24-hours prior to surgery.  No smoking 24-hours prior or 24-hours after  surgery. 8. Wear loose pants or shorts. They should be loose enough to fit over bandages, boots, and casts. 9. Don't wear slip-on shoes. Sneakers are preferred. 10. Bring your boot with you to the surgery center/hospital.  Also bring crutches or a walker if your physician has prescribed it for you.  If you do not have this equipment, it will be provided for you after surgery. 11. If you have not been contacted by the surgery center/hospital by the day before your surgery, call to confirm the date and time of your surgery. 12. Leave-time from work may vary depending on the type of surgery you have.  Appropriate arrangements should be made prior to surgery with your employer. 13. Prescriptions will be provided immediately following surgery by your doctor.  Fill these as soon as possible after surgery and take the medication as directed. Pain medications will not be refilled on weekends and must be approved by the doctor. 14. Remove nail polish on the operative foot and avoid getting pedicures prior to surgery. 15. Wash the night before surgery.  The night before surgery wash the foot and leg well with water and the antibacterial soap provided. Be sure to pay special attention to beneath the toenails and in between the toes.  Wash for at least three (3) minutes. Rinse thoroughly with water and dry well with a towel.  Perform this wash unless told not to do so by your physician.  Enclosed: 1 Ice pack (please put in freezer the night before surgery)   1 Hibiclens skin cleaner     Pre-op instructions  If you have any questions regarding the instructions, please do not hesitate to call our office.  [place]: [address], [place], [place] [address] -- [phone]  [place]: [address] [place], [place] [address] -- [phone]  [place]: [address]  [place], [place] [address] -- [phone]  [place]: [address], [address], [place], [place] [address] -- [phone]  Website:  [email or web address] 

## 2017-06-27 NOTE — Progress Notes (Signed)
Subjective:  Patient ID: Shelley [name], female    DOB: March 21, 1955,  MRN: [id number] HPI Chief Complaint  Patient presents with  . Foot Pain    Plantar midfoot left - small knot x couple months, no size change, hurts when walking sometimes, notices she puts weight on outside of foot so making it hurt, tried salonpas patch-helps some  . New Patient (Initial Visit)    62 y.o. female presents with the above complaint.   Denies fever chills nausea vomiting muscle aches pains back pain chest pain shortness of breath headache and all other systems were negative as well.  Past Medical History:  Diagnosis Date  . High cholesterol   . Mitral valve disease   . Palpitations    Past Surgical History:  Procedure Laterality Date  . Childbirth    . COLONOSCOPY    . TOTAL ABDOMINAL HYSTERECTOMY W/ BILATERAL SALPINGOOPHORECTOMY    . UPPER GASTROINTESTINAL ENDOSCOPY      Current Outpatient Medications:  .  aspirin EC 81 MG tablet, Take 81 mg by mouth at bedtime., Disp: , Rfl:  .  bisoprolol-hydrochlorothiazide (ZIAC) 5-6.25 MG per tablet, Take 0.5 tablets by mouth 2 (two) times daily., Disp: , Rfl:  .  diltiazem (CARTIA XT) 180 MG 24 hr capsule, Take 1 capsule (180 mg total) by mouth daily., Disp: 90 capsule, Rfl: 3 .  fenofibrate 160 MG tablet, Take 160 mg by mouth every morning., Disp: , Rfl:  .  lansoprazole (PREVACID) 30 MG capsule, Take 30 mg by mouth 2 (two) times daily before a meal. rx strength in am otc pm (45mg /daily), Disp: , Rfl:  .  metaxalone (SKELAXIN) 800 MG tablet, Take 400 mg by mouth daily as needed for muscle spasms. , Disp: , Rfl:  .  montelukast (SINGULAIR) 10 MG tablet, Take 10 mg by mouth every morning. , Disp: , Rfl:  .  rosuvastatin (CRESTOR) 40 MG tablet, Take 10 mg by mouth every evening., Disp: , Rfl:   Allergies  Allergen Reactions  . Codeine Nausea And Vomiting  . Morphine Nausea And Vomiting  . Predicort [Prednisolone] Other (See Comments)    Cannot take  orally, states that she can take via injection   . Penicillins Swelling and Rash    Childhood reaction   Review of Systems Objective:   Vitals:   06/26/17 1527  BP: (!) 153/90  Pulse: 78  Resp: 16    General: Well developed, nourished, in no acute distress, alert and oriented x3   Dermatological: Skin is warm, dry and supple bilateral. Nails x 10 are well maintained; remaining integument appears unremarkable at this time. There are no open sores, no preulcerative lesions, no rash or signs of infection present.  A very small BB sized subcutaneous lesion exquisitely painful just proximal to the metatarsal heads second and third left foot.  There is no erythema no cellulitis drainage or odor radiographs do not demonstrate foreign body.  Vascular: Dorsalis Pedis artery and Posterior Tibial artery pedal pulses are 2/4 bilateral with immedate capillary fill time. Pedal hair growth present. No varicosities and no lower extremity edema present bilateral.   Neruologic: Grossly intact via light touch bilateral. Vibratory intact via tuning fork bilateral. Protective threshold with Semmes Wienstein monofilament intact to all pedal sites bilateral. Patellar and Achilles deep tendon reflexes 2+ bilateral. No Babinski or clonus noted bilateral.   Musculoskeletal: No gross boney pedal deformities bilateral. No pain, crepitus, or limitation noted with foot and ankle range of motion bilateral. Muscular  strength 5/5 in all groups tested bilateral.  Gait: Unassisted, Nonantalgic.    Radiographs:  Radiographs taken do not demonstrate any type of lesion.  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    Plan: Discussed etiology pathology conservative versus surgical therapies.  At this point we consented her for excision of the lesion.  We did discuss the possible postop complications which may include but not limited to  postop pain bleeding swelling infection recurrence need further surgery overcorrection under correction failure of surgery to alleviate her symptoms.  She understands that and is amenable to it.  Signed all 3 pages a consent form dispensed a Darco shoe.  Dispensed information regarding her preoperative time as well as the surgery center and anesthesia.     Lane Kjos T. [place], [place]

## 2017-07-05 ENCOUNTER — Telehealth: Payer: Self-pay | Admitting: *Deleted

## 2017-07-05 NOTE — Telephone Encounter (Signed)
"  I need to reschedule my surgery that is scheduled for May 10.  I didn't realize my daughter was graduating from college that day.  Can he do it the following week?"  Dr. Milinda Pointer doesn't have anything available the following week.  His next available will be June 7.  "I hate to wait that long but I can't miss my daughter's graduation."  No, you have to go to the graduation, that's mandatory.

## 2017-07-05 NOTE — Telephone Encounter (Signed)
Thank you for letting me know ma'am.

## 2017-07-18 ENCOUNTER — Encounter: Payer: Self-pay | Admitting: Women's Health

## 2017-07-18 ENCOUNTER — Other Ambulatory Visit: Payer: Self-pay | Admitting: Women's Health

## 2017-07-18 ENCOUNTER — Ambulatory Visit (INDEPENDENT_AMBULATORY_CARE_PROVIDER_SITE_OTHER): Payer: BLUE CROSS/BLUE SHIELD | Admitting: Women's Health

## 2017-07-18 VITALS — BP 130/78 | Ht 65.0 in | Wt 179.0 lb

## 2017-07-18 DIAGNOSIS — Z01419 Encounter for gynecological examination (general) (routine) without abnormal findings: Secondary | ICD-10-CM | POA: Diagnosis not present

## 2017-07-18 DIAGNOSIS — Z803 Family history of malignant neoplasm of breast: Secondary | ICD-10-CM | POA: Diagnosis not present

## 2017-07-18 DIAGNOSIS — Z1231 Encounter for screening mammogram for malignant neoplasm of breast: Secondary | ICD-10-CM | POA: Diagnosis not present

## 2017-07-18 NOTE — Progress Notes (Signed)
Shelley [name] 1955-07-02 [id number]    History:    62 y/o DWF G2P1 presents for annual exam. 2003 Supracervical TAH with BSO for fibroids and endometriosis. Denies continuance of vaginal estrogen cream. Denies sexual activity. Normal pap and mammogram history. Pending recent mammogram results from 07/18/2017.Reports normal DEXA at work. 2016 negative colonoscopy. Has had both Zostavax and Pneumovax. Mother history of breast cancer at age 59 and colon cx survivor at age 60, currently in Hospice with kidney failure.. Hypertension, GERD, hypercholesterolemia managed by primary care.  Past medical history, past surgical history, family history and social history were all reviewed and documented in the EPIC chart. Plantar fascial fibromatosis, has followup sched. Works in [place]. Daughter getting PhD in history at [employer] and recently awarded Contractor for [employer].  ROS:  A ROS was performed and pertinent positives and negatives are included.  Exam:  Vitals:   07/18/17 1112  BP: 130/78  Weight: 179 lb (81.2 kg)  Height: 5\' 5"  (1.651 m)   Body mass index is 29.79 kg/m.   General appearance:  Normal Thyroid:  Symmetrical, normal in size, without palpable masses or nodularity. Respiratory  Auscultation:  Clear without wheezing or rhonchi Cardiovascular  Auscultation:  Regular rate, without rubs, murmurs or gallops  Edema/varicosities:  Not grossly evident Abdominal  Obese, soft,nontender, without masses, guarding or rebound.  Liver/spleen:  No organomegaly noted  Hernia:  None appreciated  Skin  Inspection:  Grossly normal   Breasts: Examined lying and sitting.     Right: Without masses, retractions, discharge or axillary adenopathy.     Left: Without masses, retractions, discharge or axillary adenopathy. Gentitourinary   Inguinal/mons:  Normal without inguinal adenopathy  External genitalia:  Normal  BUS/Urethra/Skene's glands:  Normal  Vagina:  Mild atrohy, +1  rectocele/cystocele asymptomatic Cervix:  Normal  Uterus:  Absent  Adnexa/parametria:     Rt: Without masses or tenderness.   Lt: Without masses or tenderness.  Anus and perineum: Normal  Digital rectal exam: Normal sphincter tone without palpated masses or tenderness  Assessment/Plan:  62 y.o.  SWF G2P1 for annual exam with no complaints.  Supracervical TAH with BSO for fibroids and endometriosis with no HRT. HTN/GERD/HL- PC manages labs and meds Obesity  Plan:  SBE's, continue annual screening mammogram, calcium rich diet, vitamin D 2000 daily encouraged. Home safety, fall prevention and importance of weightbearing exercise reviewed. Continue exercise and dancing. DEXA at work have reports sent to our office. Encouraged low carb and low calorie diet. Pap normal 2017, new screening guidelines reviewed.       [place], 11:50 AM 07/18/2017

## 2017-07-18 NOTE — Patient Instructions (Signed)
Health Maintenance for Postmenopausal Women Menopause is a normal process in which your reproductive ability comes to an end. This process happens gradually over a span of months to years, usually between the ages of 22 and 9. Menopause is complete when you have missed 12 consecutive menstrual periods. It is important to talk with your health care provider about some of the most common conditions that affect postmenopausal women, such as heart disease, cancer, and bone loss (osteoporosis). Adopting a healthy lifestyle and getting preventive care can help to promote your health and wellness. Those actions can also lower your chances of developing some of these common conditions. What should I know about menopause? During menopause, you may experience a number of symptoms, such as:  Moderate-to-severe hot flashes.  Night sweats.  Decrease in sex drive.  Mood swings.  Headaches.  Tiredness.  Irritability.  Memory problems.  Insomnia.  Choosing to treat or not to treat menopausal changes is an individual decision that you make with your health care provider. What should I know about hormone replacement therapy and supplements? Hormone therapy products are effective for treating symptoms that are associated with menopause, such as hot flashes and night sweats. Hormone replacement carries certain risks, especially as you become older. If you are thinking about using estrogen or estrogen with progestin treatments, discuss the benefits and risks with your health care provider. What should I know about heart disease and stroke? Heart disease, heart attack, and stroke become more likely as you age. This may be due, in part, to the hormonal changes that your body experiences during menopause. These can affect how your body processes dietary fats, triglycerides, and cholesterol. Heart attack and stroke are both medical emergencies. There are many things that you can do to help prevent heart disease  and stroke:  Have your blood pressure checked at least every 1-2 years. High blood pressure causes heart disease and increases the risk of stroke.  If you are 60-22 years old, ask your health care provider if you should take aspirin to prevent a heart attack or a stroke.  Do not use any tobacco products, including cigarettes, chewing tobacco, or electronic cigarettes. If you need help quitting, ask your health care provider.  It is important to eat a healthy diet and maintain a healthy weight. ? Be sure to include plenty of vegetables, fruits, low-fat dairy products, and lean protein. ? Avoid eating foods that are high in solid fats, added sugars, or salt (sodium).  Get regular exercise. This is one of the most important things that you can do for your health. ? Try to exercise for at least 150 minutes each week. The type of exercise that you do should increase your heart rate and make you sweat. This is known as moderate-intensity exercise. ? Try to do strengthening exercises at least twice each week. Do these in addition to the moderate-intensity exercise.  Know your numbers.Ask your health care provider to check your cholesterol and your blood glucose. Continue to have your blood tested as directed by your health care provider.  What should I know about cancer screening? There are several types of cancer. Take the following steps to reduce your risk and to catch any cancer development as early as possible. Breast Cancer  Practice breast self-awareness. ? This means understanding how your breasts normally appear and feel. ? It also means doing regular breast self-exams. Let your health care provider know about any changes, no matter how small.  If you are 60  or older, have a clinician do a breast exam (clinical breast exam or CBE) every year. Depending on your age, family history, and medical history, it may be recommended that you also have a yearly breast X-ray (mammogram).  If you  have a family history of breast cancer, talk with your health care provider about genetic screening.  If you are at high risk for breast cancer, talk with your health care provider about having an MRI and a mammogram every year.  Breast cancer (BRCA) gene test is recommended for women who have family members with BRCA-related cancers. Results of the assessment will determine the need for genetic counseling and BRCA1 and for BRCA2 testing. BRCA-related cancers include these types: ? Breast. This occurs in males or females. ? Ovarian. ? Tubal. This may also be called fallopian tube cancer. ? Cancer of the abdominal or pelvic lining (peritoneal cancer). ? Prostate. ? Pancreatic.  Cervical, Uterine, and Ovarian Cancer Your health care provider may recommend that you be screened regularly for cancer of the pelvic organs. These include your ovaries, uterus, and vagina. This screening involves a pelvic exam, which includes checking for microscopic changes to the surface of your cervix (Pap test).  For women ages 60-65, health care providers may recommend a pelvic exam and a Pap test every three years. For women ages 60-65, they may recommend the Pap test and pelvic exam, combined with testing for human papilloma virus (HPV), every five years. Some types of HPV increase your risk of cervical cancer. Testing for HPV may also be done on women of any age who have unclear Pap test results.  Other health care providers may not recommend any screening for nonpregnant women who are considered low risk for pelvic cancer and have no symptoms. Ask your health care provider if a screening pelvic exam is right for you.  If you have had past treatment for cervical cancer or a condition that could lead to cancer, you need Pap tests and screening for cancer for at least 20 years after your treatment. If Pap tests have been discontinued for you, your risk factors (such as having a new sexual partner) need to be  reassessed to determine if you should start having screenings again. Some women have medical problems that increase the chance of getting cervical cancer. In these cases, your health care provider may recommend that you have screening and Pap tests more often.  If you have a family history of uterine cancer or ovarian cancer, talk with your health care provider about genetic screening.  If you have vaginal bleeding after reaching menopause, tell your health care provider.  There are currently no reliable tests available to screen for ovarian cancer.  Lung Cancer Lung cancer screening is recommended for adults 69-62 years old who are at high risk for lung cancer because of a history of smoking. A yearly low-dose CT scan of the lungs is recommended if you:  Currently smoke.  Have a history of at least 30 pack-years of smoking and you currently smoke or have quit within the past 15 years. A pack-year is smoking an average of one pack of cigarettes per day for one year.  Yearly screening should:  Continue until it has been 15 years since you quit.  Stop if you develop a health problem that would prevent you from having lung cancer treatment.  Colorectal Cancer  This type of cancer can be detected and can often be prevented.  Routine colorectal cancer screening usually begins at  age 42 and continues through age 45.  If you have risk factors for colon cancer, your health care provider may recommend that you be screened at an earlier age.  If you have a family history of colorectal cancer, talk with your health care provider about genetic screening.  Your health care provider may also recommend using home test kits to check for hidden blood in your stool.  A small camera at the end of a tube can be used to examine your colon directly (sigmoidoscopy or colonoscopy). This is done to check for the earliest forms of colorectal cancer.  Direct examination of the colon should be repeated every  5-10 years until age 71. However, if early forms of precancerous polyps or small growths are found or if you have a family history or genetic risk for colorectal cancer, you may need to be screened more often.  Skin Cancer  Check your skin from head to toe regularly.  Monitor any moles. Be sure to tell your health care provider: ? About any new moles or changes in moles, especially if there is a change in a mole's shape or color. ? If you have a mole that is larger than the size of a pencil eraser.  If any of your family members has a history of skin cancer, especially at a young age, talk with your health care provider about genetic screening.  Always use sunscreen. Apply sunscreen liberally and repeatedly throughout the day.  Whenever you are outside, protect yourself by wearing long sleeves, pants, a wide-brimmed hat, and sunglasses.  What should I know about osteoporosis? Osteoporosis is a condition in which bone destruction happens more quickly than new bone creation. After menopause, you may be at an increased risk for osteoporosis. To help prevent osteoporosis or the bone fractures that can happen because of osteoporosis, the following is recommended:  If you are 46-71 years old, get at least 1,000 mg of calcium and at least 600 mg of vitamin D per day.  If you are older than age 55 but younger than age 65, get at least 1,200 mg of calcium and at least 600 mg of vitamin D per day.  If you are older than age 54, get at least 1,200 mg of calcium and at least 800 mg of vitamin D per day.  Smoking and excessive alcohol intake increase the risk of osteoporosis. Eat foods that are rich in calcium and vitamin D, and do weight-bearing exercises several times each week as directed by your health care provider. What should I know about how menopause affects my mental health? Depression may occur at any age, but it is more common as you become older. Common symptoms of depression  include:  Low or sad mood.  Changes in sleep patterns.  Changes in appetite or eating patterns.  Feeling an overall lack of motivation or enjoyment of activities that you previously enjoyed.  Frequent crying spells.  Talk with your health care provider if you think that you are experiencing depression. What should I know about immunizations? It is important that you get and maintain your immunizations. These include:  Tetanus, diphtheria, and pertussis (Tdap) booster vaccine.  Influenza every year before the flu season begins.  Pneumonia vaccine.  Shingles vaccine.  Your health care provider may also recommend other immunizations. This information is not intended to replace advice given to you by your health care provider. Make sure you discuss any questions you have with your health care provider. Document Released: 04/21/2005  Document Revised: 09/17/2015 Document Reviewed: 12/01/2014 Elsevier Interactive Patient Education  2018 [employer]  

## 2017-07-23 DIAGNOSIS — Z719 Counseling, unspecified: Secondary | ICD-10-CM | POA: Diagnosis not present

## 2017-07-23 DIAGNOSIS — Z008 Encounter for other general examination: Secondary | ICD-10-CM | POA: Diagnosis not present

## 2017-07-31 ENCOUNTER — Telehealth: Payer: Self-pay | Admitting: Podiatry

## 2017-07-31 NOTE — Telephone Encounter (Signed)
I saw Dr. Milinda Pointer in April and he has me scheduled to take a small lump off the bottom of my foot on 07 June. I would like someone to check with him because its completley disappeared, its gone. I've even had the doctor here at work to check and there is nothing there for him to do surgery on. If you would just get someone to check with him and give me a call back at [phone]. Thank you.

## 2017-08-01 NOTE — Telephone Encounter (Signed)
No

## 2017-08-02 NOTE — Telephone Encounter (Signed)
I am returning your call.  "Yes, I don't know what happened but that place is gone.  I had the PA and the nurse to look at it on my job and they didn't see anything either.  The nurse is the one that told me I needed to have it looked at in the first place."  I will cancel your surgery.  "If it comes back, I'll come back to see him."  I called Shelley [name] at the surgical center and canceled the surgery.

## 2017-08-14 ENCOUNTER — Encounter (INDEPENDENT_AMBULATORY_CARE_PROVIDER_SITE_OTHER): Payer: Self-pay

## 2017-08-14 ENCOUNTER — Encounter: Payer: Self-pay | Admitting: Gastroenterology

## 2017-08-14 ENCOUNTER — Ambulatory Visit: Payer: BLUE CROSS/BLUE SHIELD | Admitting: Gastroenterology

## 2017-08-14 VITALS — BP 142/86 | HR 78 | Ht 66.0 in | Wt 179.0 lb

## 2017-08-14 DIAGNOSIS — R142 Eructation: Secondary | ICD-10-CM | POA: Diagnosis not present

## 2017-08-14 DIAGNOSIS — R1906 Epigastric swelling, mass or lump: Secondary | ICD-10-CM | POA: Diagnosis not present

## 2017-08-14 MED ORDER — RANITIDINE HCL 150 MG PO CAPS: 150.0000 mg | ORAL_CAPSULE | Freq: Two times a day (BID) | ORAL | 2 refills | Status: DC

## 2017-08-14 NOTE — Progress Notes (Addendum)
08/14/2017 Shelley [name] [id number] 1955-04-19   HISTORY OF PRESENT ILLNESS:  This is a pleasant 62 year old female who is known to Dr. Carlean Purl.  Has long-standing GI history.  Presents to our office today with complaints of epigastric fullness and belching.  Has reflux issues and has been on PPI for several years.  Recently has tried several different PPI's with hope of helping her symptoms.  Also taking Gas-X.  Denies any abdominal pain, just says that it feels very full.  Has a ton of belching that she says is "uncontrollable" no matter what she eats, can eat a few crackers or some pizza and it can be the exact same symptoms.  Does not happen every time that she eats, but very often.  Overall this has been present for the past 6 months and she says that she's never had symptoms similar to this in the past.  She did have a normal GES in 2007, however.  She had an EGD in 10/2014 that showed only a small hiatal hernia.  No dysphagia or weight loss.   Past Medical History:  Diagnosis Date  . High cholesterol   . Mitral valve disease   . Palpitations    Past Surgical History:  Procedure Laterality Date  . Childbirth    . COLONOSCOPY    . TOTAL ABDOMINAL HYSTERECTOMY W/ BILATERAL SALPINGOOPHORECTOMY    . UPPER GASTROINTESTINAL ENDOSCOPY      reports that she quit smoking about 6 years ago. Her smoking use included cigarettes. She has never used smokeless tobacco. She reports that she does not drink alcohol or use drugs. family history includes Bladder Cancer in her father; Breast cancer in her paternal aunt and paternal aunt; Breast cancer (age of onset: 59) in her mother; Colon cancer (age of onset: 28) in her mother; Hyperlipidemia in her father and mother; Hypertension in her mother. Allergies  Allergen Reactions  . Codeine Nausea And Vomiting  . Morphine Nausea And Vomiting  . Predicort [Prednisolone] Other (See Comments)    Cannot take orally, states that she can take via  injection   . Penicillins Swelling and Rash    Childhood reaction      Outpatient Encounter Medications as of 08/14/2017  Medication Sig  . aspirin EC 81 MG tablet Take 81 mg by mouth at bedtime.  . bisoprolol-hydrochlorothiazide (ZIAC) 5-6.25 MG per tablet Take 0.5 tablets by mouth 2 (two) times daily.  Marland Kitchen diltiazem (CARTIA XT) 180 MG 24 hr capsule Take 1 capsule (180 mg total) by mouth daily.  Marland Kitchen esomeprazole (NEXIUM) 20 MG capsule Take 20 mg by mouth 2 (two) times daily before a meal.  . fenofibrate 160 MG tablet Take 160 mg by mouth every morning.  . metaxalone (SKELAXIN) 800 MG tablet Take 400 mg by mouth daily as needed for muscle spasms.   . montelukast (SINGULAIR) 10 MG tablet Take 10 mg by mouth every morning.   . rosuvastatin (CRESTOR) 40 MG tablet Take 10 mg by mouth every evening.  . [DISCONTINUED] lansoprazole (PREVACID) 30 MG capsule Take 30 mg by mouth 2 (two) times daily before a meal. rx strength in am otc pm (45mg /daily)   No facility-administered encounter medications on file as of 08/14/2017.      REVIEW OF SYSTEMS  : All other systems reviewed and negative except where noted in the History of Present Illness.   PHYSICAL EXAM: BP (!) 142/86   Pulse 78   Ht 5\' 6"  (1.676 m)  Wt 179 lb (81.2 kg)   BMI 28.89 kg/m  General: Well developed white female in no acute distress Head: Normocephalic and atraumatic Eyes:  Sclerae anicteric, conjunctiva pink. Ears: Normal auditory acuity. Lungs: Clear throughout to auscultation; no increased WOB. Heart: Regular rate and rhythm; no M/R/G. Abdomen: Soft, non-distended.  BS present.  Non-tender. Musculoskeletal: Symmetrical with no gross deformities  Skin: No lesions on visible extremities Extremities: No edema  Neurological: Alert oriented x 4, grossly non-focal Psychological:  Alert and cooperative. Normal mood and affect  ASSESSMENT AND PLAN: *62 year old female with UGI complaints dating back at least 12 years, but now  with complaints of epigastric fullness and belching, which she says are new.  EGD was normal except small hiatal hernia less than 3 years ago.  Has been on several different PPI's.  Had negative GES in 2007 looks like for similar complaints.  Will check UGI.  May need to consider repeat GES.  Will discontinue all PPI's and try zantac 150 mg daily, increase to BID if needed.  Will give some samples of FDgard to try as well.     CC:  [name], PA-C  UGI small hiatal hernia Back on Prevacid and using FD Gard with success  Gatha Mayer, MD, [facility]

## 2017-08-14 NOTE — Patient Instructions (Signed)
You have been scheduled for an Upper GI Series at [facility]. Your appointment is on 08-17-17 at 8:30 am. Please arrive 15 minutes prior to your test for registration. Make sure not to eat or drink anything after midnight on the night before your test. If you need to reschedule, please call radiology at [phone]. ________________________________________________________________ An upper GI series uses x rays to help diagnose problems of the upper GI tract, which includes the esophagus, stomach, and duodenum. The duodenum is the first part of the small intestine. An upper GI series is conducted by a radiology technologist or a radiologist-a doctor who specializes in x-ray imaging-at a hospital or outpatient center. While sitting or standing in front of an x-ray machine, the patient drinks barium liquid, which is often white and has a chalky consistency and taste. The barium liquid coats the lining of the upper GI tract and makes signs of disease show up more clearly on x rays. X-ray video, called fluoroscopy, is used to view the barium liquid moving through the esophagus, stomach, and duodenum. Additional x rays and fluoroscopy are performed while the patient lies on an x-ray table. To fully coat the upper GI tract with barium liquid, the technologist or radiologist may press on the abdomen or ask the patient to change position. Patients hold still in various positions, allowing the technologist or radiologist to take x rays of the upper GI tract at different angles. If a technologist conducts the upper GI series, a radiologist will later examine the images to look for problems.  This test typically takes about 1 hour to complete. __________________________________________________________________   Zantac 150 mg twice a day.   Stop all other PPIs (prevacid)  Start FD gard as directed.

## 2017-08-17 ENCOUNTER — Ambulatory Visit (HOSPITAL_COMMUNITY)
Admission: RE | Admit: 2017-08-17 | Discharge: 2017-08-17 | Disposition: A | Discharge status: Home / Self Care | Payer: BLUE CROSS/BLUE SHIELD | Source: Ambulatory Visit | Attending: Gastroenterology | Admitting: Gastroenterology

## 2017-08-17 DIAGNOSIS — K449 Diaphragmatic hernia without obstruction or gangrene: Secondary | ICD-10-CM | POA: Diagnosis not present

## 2017-08-17 DIAGNOSIS — R142 Eructation: Secondary | ICD-10-CM | POA: Diagnosis not present

## 2017-08-17 DIAGNOSIS — R1906 Epigastric swelling, mass or lump: Secondary | ICD-10-CM

## 2017-10-15 DIAGNOSIS — R300 Dysuria: Secondary | ICD-10-CM | POA: Diagnosis not present

## 2017-11-05 DIAGNOSIS — Z139 Encounter for screening, unspecified: Secondary | ICD-10-CM | POA: Diagnosis not present

## 2017-11-05 DIAGNOSIS — Z79899 Other long term (current) drug therapy: Secondary | ICD-10-CM | POA: Diagnosis not present

## 2017-11-05 DIAGNOSIS — E559 Vitamin D deficiency, unspecified: Secondary | ICD-10-CM | POA: Diagnosis not present

## 2017-11-05 DIAGNOSIS — Z0130 Encounter for examination of blood pressure without abnormal findings: Secondary | ICD-10-CM | POA: Diagnosis not present

## 2017-11-05 DIAGNOSIS — E782 Mixed hyperlipidemia: Secondary | ICD-10-CM | POA: Diagnosis not present

## 2017-11-08 DIAGNOSIS — J0100 Acute maxillary sinusitis, unspecified: Secondary | ICD-10-CM | POA: Diagnosis not present

## 2017-11-14 DIAGNOSIS — Z008 Encounter for other general examination: Secondary | ICD-10-CM | POA: Diagnosis not present

## 2017-11-14 DIAGNOSIS — R899 Unspecified abnormal finding in specimens from other organs, systems and tissues: Secondary | ICD-10-CM | POA: Diagnosis not present

## 2017-11-14 DIAGNOSIS — E782 Mixed hyperlipidemia: Secondary | ICD-10-CM | POA: Diagnosis not present

## 2017-11-14 DIAGNOSIS — I10 Essential (primary) hypertension: Secondary | ICD-10-CM | POA: Diagnosis not present

## 2017-11-14 DIAGNOSIS — E559 Vitamin D deficiency, unspecified: Secondary | ICD-10-CM | POA: Diagnosis not present

## 2017-11-26 DIAGNOSIS — J018 Other acute sinusitis: Secondary | ICD-10-CM | POA: Diagnosis not present

## 2017-12-04 DIAGNOSIS — Z23 Encounter for immunization: Secondary | ICD-10-CM | POA: Diagnosis not present

## 2017-12-10 DIAGNOSIS — J310 Chronic rhinitis: Secondary | ICD-10-CM | POA: Diagnosis not present

## 2018-01-30 DIAGNOSIS — Z008 Encounter for other general examination: Secondary | ICD-10-CM | POA: Diagnosis not present

## 2018-01-30 DIAGNOSIS — E559 Vitamin D deficiency, unspecified: Secondary | ICD-10-CM | POA: Diagnosis not present

## 2018-01-30 DIAGNOSIS — E782 Mixed hyperlipidemia: Secondary | ICD-10-CM | POA: Diagnosis not present

## 2018-01-30 DIAGNOSIS — Z719 Counseling, unspecified: Secondary | ICD-10-CM | POA: Diagnosis not present

## 2018-02-11 DIAGNOSIS — J0100 Acute maxillary sinusitis, unspecified: Secondary | ICD-10-CM | POA: Diagnosis not present

## 2018-04-04 DIAGNOSIS — M5136 Other intervertebral disc degeneration, lumbar region: Secondary | ICD-10-CM | POA: Diagnosis not present

## 2018-04-04 DIAGNOSIS — E663 Overweight: Secondary | ICD-10-CM | POA: Diagnosis not present

## 2018-04-04 DIAGNOSIS — Z1389 Encounter for screening for other disorder: Secondary | ICD-10-CM | POA: Diagnosis not present

## 2018-04-04 DIAGNOSIS — Z6829 Body mass index (BMI) 29.0-29.9, adult: Secondary | ICD-10-CM | POA: Diagnosis not present

## 2018-04-04 DIAGNOSIS — M545 Low back pain: Secondary | ICD-10-CM | POA: Diagnosis not present

## 2018-04-10 DIAGNOSIS — H40013 Open angle with borderline findings, low risk, bilateral: Secondary | ICD-10-CM | POA: Diagnosis not present

## 2018-04-15 DIAGNOSIS — Z0130 Encounter for examination of blood pressure without abnormal findings: Secondary | ICD-10-CM | POA: Diagnosis not present

## 2018-04-15 DIAGNOSIS — E559 Vitamin D deficiency, unspecified: Secondary | ICD-10-CM | POA: Diagnosis not present

## 2018-04-15 DIAGNOSIS — Z79899 Other long term (current) drug therapy: Secondary | ICD-10-CM | POA: Diagnosis not present

## 2018-04-15 DIAGNOSIS — Z139 Encounter for screening, unspecified: Secondary | ICD-10-CM | POA: Diagnosis not present

## 2018-04-15 DIAGNOSIS — E782 Mixed hyperlipidemia: Secondary | ICD-10-CM | POA: Diagnosis not present

## 2018-05-01 DIAGNOSIS — K219 Gastro-esophageal reflux disease without esophagitis: Secondary | ICD-10-CM | POA: Diagnosis not present

## 2018-05-01 DIAGNOSIS — Z008 Encounter for other general examination: Secondary | ICD-10-CM | POA: Diagnosis not present

## 2018-05-01 DIAGNOSIS — E782 Mixed hyperlipidemia: Secondary | ICD-10-CM | POA: Diagnosis not present

## 2018-05-01 DIAGNOSIS — I10 Essential (primary) hypertension: Secondary | ICD-10-CM | POA: Diagnosis not present

## 2018-05-01 DIAGNOSIS — N3010 Interstitial cystitis (chronic) without hematuria: Secondary | ICD-10-CM | POA: Diagnosis not present

## 2018-05-01 DIAGNOSIS — E559 Vitamin D deficiency, unspecified: Secondary | ICD-10-CM | POA: Diagnosis not present

## 2018-05-13 DIAGNOSIS — H40053 Ocular hypertension, bilateral: Secondary | ICD-10-CM | POA: Diagnosis not present

## 2018-06-17 DIAGNOSIS — K122 Cellulitis and abscess of mouth: Secondary | ICD-10-CM | POA: Diagnosis not present

## 2018-07-05 DIAGNOSIS — H40053 Ocular hypertension, bilateral: Secondary | ICD-10-CM | POA: Diagnosis not present

## 2018-07-24 ENCOUNTER — Encounter: Payer: BLUE CROSS/BLUE SHIELD | Admitting: Women's Health

## 2018-08-02 DIAGNOSIS — Z6829 Body mass index (BMI) 29.0-29.9, adult: Secondary | ICD-10-CM | POA: Diagnosis not present

## 2018-08-02 DIAGNOSIS — E663 Overweight: Secondary | ICD-10-CM | POA: Diagnosis not present

## 2018-08-02 DIAGNOSIS — Z1389 Encounter for screening for other disorder: Secondary | ICD-10-CM | POA: Diagnosis not present

## 2018-08-02 DIAGNOSIS — M26621 Arthralgia of right temporomandibular joint: Secondary | ICD-10-CM | POA: Diagnosis not present

## 2018-08-27 ENCOUNTER — Other Ambulatory Visit: Payer: Self-pay

## 2018-08-28 ENCOUNTER — Encounter: Payer: Self-pay | Admitting: Women's Health

## 2018-08-28 ENCOUNTER — Ambulatory Visit (INDEPENDENT_AMBULATORY_CARE_PROVIDER_SITE_OTHER): Payer: BC Managed Care – PPO | Admitting: Women's Health

## 2018-08-28 ENCOUNTER — Other Ambulatory Visit: Payer: Self-pay

## 2018-08-28 VITALS — BP 124/78 | Ht 65.0 in | Wt 179.0 lb

## 2018-08-28 DIAGNOSIS — Z1151 Encounter for screening for human papillomavirus (HPV): Secondary | ICD-10-CM

## 2018-08-28 DIAGNOSIS — Z124 Encounter for screening for malignant neoplasm of cervix: Secondary | ICD-10-CM

## 2018-08-28 DIAGNOSIS — Z803 Family history of malignant neoplasm of breast: Secondary | ICD-10-CM | POA: Diagnosis not present

## 2018-08-28 DIAGNOSIS — Z01419 Encounter for gynecological examination (general) (routine) without abnormal findings: Secondary | ICD-10-CM

## 2018-08-28 DIAGNOSIS — Z1231 Encounter for screening mammogram for malignant neoplasm of breast: Secondary | ICD-10-CM | POA: Diagnosis not present

## 2018-08-28 NOTE — Patient Instructions (Signed)
Carbohydrate Counting for Diabetes Mellitus, Adult  Carbohydrate counting is a method of keeping track of how many carbohydrates you eat. Eating carbohydrates naturally increases the amount of sugar (glucose) in the blood. Counting how many carbohydrates you eat helps keep your blood glucose within normal limits, which helps you manage your diabetes (diabetes mellitus). It is important to know how many carbohydrates you can safely have in each meal. This is different for every person. A diet and nutrition specialist (registered dietitian) can help you make a meal plan and calculate how many carbohydrates you should have at each meal and snack. Carbohydrates are found in the following foods:  Grains, such as breads and cereals.  Dried beans and soy products.  Starchy vegetables, such as potatoes, peas, and corn.  Fruit and fruit juices.  Milk and yogurt.  Sweets and snack foods, such as cake, cookies, candy, chips, and soft drinks. How do I count carbohydrates? There are two ways to count carbohydrates in food. You can use either of the methods or a combination of both. Reading "Nutrition Facts" on packaged food The "Nutrition Facts" list is included on the labels of almost all packaged foods and beverages in the [place] It includes:  The serving size.  Information about nutrients in each serving, including the grams (g) of carbohydrate per serving. To use the Nutrition Facts":  Decide how many servings you will have.  Multiply the number of servings by the number of carbohydrates per serving.  The resulting number is the total amount of carbohydrates that you will be having. Learning standard serving sizes of other foods When you eat carbohydrate foods that are not packaged or do not include "Nutrition Facts" on the label, you need to measure the servings in order to count the amount of carbohydrates:  Measure the foods that you will eat with a food scale or measuring cup, if  needed.  Decide how many standard-size servings you will eat.  Multiply the number of servings by 15. Most carbohydrate-rich foods have about 15 g of carbohydrates per serving. ? For example, if you eat 8 oz (170 g) of strawberries, you will have eaten 2 servings and 30 g of carbohydrates (2 servings x 15 g = 30 g).  For foods that have more than one food mixed, such as soups and casseroles, you must count the carbohydrates in each food that is included. The following list contains standard serving sizes of common carbohydrate-rich foods. Each of these servings has about 15 g of carbohydrates:   hamburger bun or  English muffin.   oz (15 mL) syrup.   oz (14 g) jelly.  1 slice of bread.  1 six-inch tortilla.  3 oz (85 g) cooked rice or pasta.  4 oz (113 g) cooked dried beans.  4 oz (113 g) starchy vegetable, such as peas, corn, or potatoes.  4 oz (113 g) hot cereal.  4 oz (113 g) mashed potatoes or  of a large baked potato.  4 oz (113 g) canned or frozen fruit.  4 oz (120 mL) fruit juice.  4-6 crackers.  6 chicken nuggets.  6 oz (170 g) unsweetened dry cereal.  6 oz (170 g) plain fat-free yogurt or yogurt sweetened with artificial sweeteners.  8 oz (240 mL) milk.  8 oz (170 g) fresh fruit or one small piece of fruit.  24 oz (680 g) popped popcorn. Example of carbohydrate counting Sample meal  3 oz (85 g) chicken breast.  6 oz (170 g)  brown rice.  4 oz (113 g) corn.  8 oz (240 mL) milk.  8 oz (170 g) strawberries with sugar-free whipped topping. Carbohydrate calculation 1. Identify the foods that contain carbohydrates: ? Rice. ? Corn. ? Milk. ? Strawberries. 2. Calculate how many servings you have of each food: ? 2 servings rice. ? 1 serving corn. ? 1 serving milk. ? 1 serving strawberries. 3. Multiply each number of servings by 15 g: ? 2 servings rice x 15 g = 30 g. ? 1 serving corn x 15 g = 15 g. ? 1 serving milk x 15 g = 15 g. ? 1  serving strawberries x 15 g = 15 g. 4. Add together all of the amounts to find the total grams of carbohydrates eaten: ? 30 g + 15 g + 15 g + 15 g = 75 g of carbohydrates total. Summary  Carbohydrate counting is a method of keeping track of how many carbohydrates you eat.  Eating carbohydrates naturally increases the amount of sugar (glucose) in the blood.  Counting how many carbohydrates you eat helps keep your blood glucose within normal limits, which helps you manage your diabetes.  A diet and nutrition specialist (registered dietitian) can help you make a meal plan and calculate how many carbohydrates you should have at each meal and snack. This information is not intended to replace advice given to you by your health care provider. Make sure you discuss any questions you have with your health care provider. Document Released: 02/27/2005 Document Revised: 09/06/2016 Document Reviewed: 08/11/2015 Elsevier Interactive Patient Education  2019 [employer] Maintenance for Postmenopausal Women Menopause is a normal process in which your reproductive ability comes to an end. This process happens gradually over a span of months to years, usually between the ages of 12 and 33. Menopause is complete when you have missed 12 consecutive menstrual periods. It is important to talk with your health care provider about some of the most common conditions that affect postmenopausal women, such as heart disease, cancer, and bone loss (osteoporosis). Adopting a healthy lifestyle and getting preventive care can help to promote your health and wellness. Those actions can also lower your chances of developing some of these common conditions. What should I know about menopause? During menopause, you may experience a number of symptoms, such as:  Moderate-to-severe hot flashes.  Night sweats.  Decrease in sex drive.  Mood swings.  Headaches.  Tiredness.  Irritability.  Memory  problems.  Insomnia. Choosing to treat or not to treat menopausal changes is an individual decision that you make with your health care provider. What should I know about hormone replacement therapy and supplements? Hormone therapy products are effective for treating symptoms that are associated with menopause, such as hot flashes and night sweats. Hormone replacement carries certain risks, especially as you become older. If you are thinking about using estrogen or estrogen with progestin treatments, discuss the benefits and risks with your health care provider. What should I know about heart disease and stroke? Heart disease, heart attack, and stroke become more likely as you age. This may be due, in part, to the hormonal changes that your body experiences during menopause. These can affect how your body processes dietary fats, triglycerides, and cholesterol. Heart attack and stroke are both medical emergencies. There are many things that you can do to help prevent heart disease and stroke:  Have your blood pressure checked at least every 1-2 years. High blood pressure causes heart disease and  increases the risk of stroke.  If you are 11-58 years old, ask your health care provider if you should take aspirin to prevent a heart attack or a stroke.  Do not use any tobacco products, including cigarettes, chewing tobacco, or electronic cigarettes. If you need help quitting, ask your health care provider.  It is important to eat a healthy diet and maintain a healthy weight. ? Be sure to include plenty of vegetables, fruits, low-fat dairy products, and lean protein. ? Avoid eating foods that are high in solid fats, added sugars, or salt (sodium).  Get regular exercise. This is one of the most important things that you can do for your health. ? Try to exercise for at least 150 minutes each week. The type of exercise that you do should increase your heart rate and make you sweat. This is known as  moderate-intensity exercise. ? Try to do strengthening exercises at least twice each week. Do these in addition to the moderate-intensity exercise.  Know your numbers.Ask your health care provider to check your cholesterol and your blood glucose. Continue to have your blood tested as directed by your health care provider.  What should I know about cancer screening? There are several types of cancer. Take the following steps to reduce your risk and to catch any cancer development as early as possible. Breast Cancer  Practice breast self-awareness. ? This means understanding how your breasts normally appear and feel. ? It also means doing regular breast self-exams. Let your health care provider know about any changes, no matter how small.  If you are 57 or older, have a clinician do a breast exam (clinical breast exam or CBE) every year. Depending on your age, family history, and medical history, it may be recommended that you also have a yearly breast X-ray (mammogram).  If you have a family history of breast cancer, talk with your health care provider about genetic screening.  If you are at high risk for breast cancer, talk with your health care provider about having an MRI and a mammogram every year.  Breast cancer (BRCA) gene test is recommended for women who have family members with BRCA-related cancers. Results of the assessment will determine the need for genetic counseling and BRCA1 and for BRCA2 testing. BRCA-related cancers include these types: ? Breast. This occurs in males or females. ? Ovarian. ? Tubal. This may also be called fallopian tube cancer. ? Cancer of the abdominal or pelvic lining (peritoneal cancer). ? Prostate. ? Pancreatic. Cervical, Uterine, and Ovarian Cancer Your health care provider may recommend that you be screened regularly for cancer of the pelvic organs. These include your ovaries, uterus, and vagina. This screening involves a pelvic exam, which includes  checking for microscopic changes to the surface of your cervix (Pap test).  For women ages 21-65, health care providers may recommend a pelvic exam and a Pap test every three years. For women ages 42-65, they may recommend the Pap test and pelvic exam, combined with testing for human papilloma virus (HPV), every five years. Some types of HPV increase your risk of cervical cancer. Testing for HPV may also be done on women of any age who have unclear Pap test results.  Other health care providers may not recommend any screening for nonpregnant women who are considered low risk for pelvic cancer and have no symptoms. Ask your health care provider if a screening pelvic exam is right for you.  If you have had past treatment for cervical cancer  or a condition that could lead to cancer, you need Pap tests and screening for cancer for at least 20 years after your treatment. If Pap tests have been discontinued for you, your risk factors (such as having a new sexual partner) need to be reassessed to determine if you should start having screenings again. Some women have medical problems that increase the chance of getting cervical cancer. In these cases, your health care provider may recommend that you have screening and Pap tests more often.  If you have a family history of uterine cancer or ovarian cancer, talk with your health care provider about genetic screening.  If you have vaginal bleeding after reaching menopause, tell your health care provider.  There are currently no reliable tests available to screen for ovarian cancer. Lung Cancer Lung cancer screening is recommended for adults 37-46 years old who are at high risk for lung cancer because of a history of smoking. A yearly low-dose CT scan of the lungs is recommended if you:  Currently smoke.  Have a history of at least 30 pack-years of smoking and you currently smoke or have quit within the past 15 years. A pack-year is smoking an average of one  pack of cigarettes per day for one year. Yearly screening should:  Continue until it has been 15 years since you quit.  Stop if you develop a health problem that would prevent you from having lung cancer treatment. Colorectal Cancer  This type of cancer can be detected and can often be prevented.  Routine colorectal cancer screening usually begins at age 44 and continues through age 35.  If you have risk factors for colon cancer, your health care provider may recommend that you be screened at an earlier age.  If you have a family history of colorectal cancer, talk with your health care provider about genetic screening.  Your health care provider may also recommend using home test kits to check for hidden blood in your stool.  A small camera at the end of a tube can be used to examine your colon directly (sigmoidoscopy or colonoscopy). This is done to check for the earliest forms of colorectal cancer.  Direct examination of the colon should be repeated every 5-10 years until age 33. However, if early forms of precancerous polyps or small growths are found or if you have a family history or genetic risk for colorectal cancer, you may need to be screened more often. Skin Cancer  Check your skin from head to toe regularly.  Monitor any moles. Be sure to tell your health care provider: ? About any new moles or changes in moles, especially if there is a change in a mole's shape or color. ? If you have a mole that is larger than the size of a pencil eraser.  If any of your family members has a history of skin cancer, especially at a [place] age, talk with your health care provider about genetic screening.  Always use sunscreen. Apply sunscreen liberally and repeatedly throughout the day.  Whenever you are outside, protect yourself by wearing long sleeves, pants, a wide-brimmed hat, and sunglasses. What should I know about osteoporosis? Osteoporosis is a condition in which bone destruction  happens more quickly than new bone creation. After menopause, you may be at an increased risk for osteoporosis. To help prevent osteoporosis or the bone fractures that can happen because of osteoporosis, the following is recommended:  If you are 19-28 years old, get at least 1,000 mg of  calcium and at least 600 mg of vitamin D per day.  If you are older than age 22 but younger than age 74, get at least 1,200 mg of calcium and at least 600 mg of vitamin D per day.  If you are older than age 69, get at least 1,200 mg of calcium and at least 800 mg of vitamin D per day. Smoking and excessive alcohol intake increase the risk of osteoporosis. Eat foods that are rich in calcium and vitamin D, and do weight-bearing exercises several times each week as directed by your health care provider. What should I know about how menopause affects my mental health? Depression may occur at any age, but it is more common as you become older. Common symptoms of depression include:  Low or sad mood.  Changes in sleep patterns.  Changes in appetite or eating patterns.  Feeling an overall lack of motivation or enjoyment of activities that you previously enjoyed.  Frequent crying spells. Talk with your health care provider if you think that you are experiencing depression. What should I know about immunizations? It is important that you get and maintain your immunizations. These include:  Tetanus, diphtheria, and pertussis (Tdap) booster vaccine.  Influenza every year before the flu season begins.  Pneumonia vaccine.  Shingles vaccine. Your health care provider may also recommend other immunizations. This information is not intended to replace advice given to you by your health care provider. Make sure you discuss any questions you have with your health care provider. Document Released: 04/21/2005 Document Revised: 09/17/2015 Document Reviewed: 12/01/2014 Elsevier Interactive Patient Education  2019  [employer].

## 2018-08-28 NOTE — Progress Notes (Signed)
Shelley [name] [birth year] [id number]    History:    Presents for annual exam.  06/20/01 supracervical TAH with BSO for fibroids and endometriosis.  Not sexually active.  Normal Pap and mammogram history.  Jun 21, 2014- colonoscopy.  Vaccines current.  Mother breast cancer at 7, colon cancer at 18 deceased Jun 20, 2017 from kidney failure.  Reports normal DEXA at work.  Primary  care manages hypertension, asthma, GERD and hypercholesteremia.  Past medical history, past surgical history, family history and social history were all reviewed and documented in the EPIC chart.  One daughter completing PhD in education.  ROS:  A ROS was performed and pertinent positives and negatives are included.  Exam:  Vitals:   08/28/18 1054  BP: 124/78  Weight: 179 lb (81.2 kg)  Height: 5\' 5"  (1.651 m)   Body mass index is 29.79 kg/m.   General appearance:  Normal Thyroid:  Symmetrical, normal in size, without palpable masses or nodularity. Respiratory  Auscultation:  Clear without wheezing or rhonchi Cardiovascular  Auscultation:  Regular rate, without rubs, murmurs or gallops  Edema/varicosities:  Not grossly evident Abdominal  Soft,nontender, without masses, guarding or rebound.  Liver/spleen:  No organomegaly noted  Hernia:  None appreciated  Skin  Inspection:  Grossly normal   Breasts: Examined lying and sitting.     Right: Without masses, retractions, discharge or axillary adenopathy.     Left: Without masses, retractions, discharge or axillary adenopathy. Gentitourinary   Inguinal/mons:  Normal without inguinal adenopathy  External genitalia:  Normal  BUS/Urethra/Skene's glands:  Normal  Vagina:  Normal  Cervix:  Normal  Uterus: Absent  Adnexa/parametria:     Rt: Without masses or tenderness.   Lt: Without masses or tenderness.  Anus and perineum: Normal  Digital rectal exam: Normal sphincter tone without palpated masses or tenderness  Assessment/Plan:  63 y.o. MWF G2, P1 for annual exam with no  complaints.  06/20/01 supracervical TAH with BSO for fibroids and endometriosis on no HRT Hypertension, hypercholesteremia, GERD, asthma-primary care manages labs and meds Obesity  Plan: SBEs, continue annual screening mammogram-[facility], increase regular cardio type exercise, decrease calorie/carbs, calcium rich foods, vitamin D 2000 daily encouraged.  Pap with HR HPV typing, reviewed if normal no further testing required.   [place], 11:39 AM 08/28/2018

## 2018-08-30 LAB — PAP, TP IMAGING W/ HPV RNA, RFLX HPV TYPE 16,18/45: HPV DNA High Risk: NOT DETECTED

## 2018-11-06 DIAGNOSIS — R7309 Other abnormal glucose: Secondary | ICD-10-CM | POA: Diagnosis not present

## 2018-11-14 DIAGNOSIS — H40013 Open angle with borderline findings, low risk, bilateral: Secondary | ICD-10-CM | POA: Diagnosis not present

## 2018-12-23 DIAGNOSIS — Z23 Encounter for immunization: Secondary | ICD-10-CM | POA: Diagnosis not present

## 2019-01-10 DIAGNOSIS — M62830 Muscle spasm of back: Secondary | ICD-10-CM | POA: Diagnosis not present

## 2019-01-10 DIAGNOSIS — Z6829 Body mass index (BMI) 29.0-29.9, adult: Secondary | ICD-10-CM | POA: Diagnosis not present

## 2019-01-10 DIAGNOSIS — E663 Overweight: Secondary | ICD-10-CM | POA: Diagnosis not present

## 2019-01-17 ENCOUNTER — Encounter: Payer: Self-pay | Admitting: *Deleted

## 2019-01-23 DIAGNOSIS — I10 Essential (primary) hypertension: Secondary | ICD-10-CM | POA: Diagnosis not present

## 2019-01-23 DIAGNOSIS — I499 Cardiac arrhythmia, unspecified: Secondary | ICD-10-CM | POA: Diagnosis not present

## 2019-01-23 DIAGNOSIS — Z6828 Body mass index (BMI) 28.0-28.9, adult: Secondary | ICD-10-CM | POA: Diagnosis not present

## 2019-01-23 DIAGNOSIS — E781 Pure hyperglyceridemia: Secondary | ICD-10-CM | POA: Diagnosis not present

## 2019-01-23 DIAGNOSIS — K219 Gastro-esophageal reflux disease without esophagitis: Secondary | ICD-10-CM | POA: Diagnosis not present

## 2019-02-04 DIAGNOSIS — Z0130 Encounter for examination of blood pressure without abnormal findings: Secondary | ICD-10-CM | POA: Diagnosis not present

## 2019-02-04 DIAGNOSIS — Z79899 Other long term (current) drug therapy: Secondary | ICD-10-CM | POA: Diagnosis not present

## 2019-02-04 DIAGNOSIS — E782 Mixed hyperlipidemia: Secondary | ICD-10-CM | POA: Diagnosis not present

## 2019-02-04 DIAGNOSIS — Z139 Encounter for screening, unspecified: Secondary | ICD-10-CM | POA: Diagnosis not present

## 2019-02-04 DIAGNOSIS — E559 Vitamin D deficiency, unspecified: Secondary | ICD-10-CM | POA: Diagnosis not present

## 2019-02-04 DIAGNOSIS — Z719 Counseling, unspecified: Secondary | ICD-10-CM | POA: Diagnosis not present

## 2019-02-17 DIAGNOSIS — Z719 Counseling, unspecified: Secondary | ICD-10-CM | POA: Diagnosis not present

## 2019-02-17 DIAGNOSIS — E559 Vitamin D deficiency, unspecified: Secondary | ICD-10-CM | POA: Diagnosis not present

## 2019-02-17 DIAGNOSIS — R7301 Impaired fasting glucose: Secondary | ICD-10-CM | POA: Diagnosis not present

## 2019-02-17 DIAGNOSIS — E782 Mixed hyperlipidemia: Secondary | ICD-10-CM | POA: Diagnosis not present

## 2019-03-24 ENCOUNTER — Encounter: Payer: Self-pay | Admitting: Cardiology

## 2019-03-24 NOTE — Progress Notes (Signed)
Cardiology Office Note  Date: 03/25/2019   ID: Shanthi, Cruise [birth year], MRN [id number]  PCP:  [name], PA-C  Consulting Cardiologist: Satira Sark, MD Electrophysiologist:  None   Chief Complaint  Patient presents with  . History of palpitations    History of Present Illness: Shelley [name] is a 64 y.o. female referred back to the office by Ms. Inocente Salles to reestablish cardiology follow-up.  She was last seen in 2016 for follow-up of palpitations.  She follows through a wellness program at work.  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  She states that her blood pressure has been increasing of late, systolics in the 0000000 to [id number]. She has been able to lose 10 pounds recently focusing on diet and states she is taking her medications regularly.  Palpitations have been no worse, no sudden dizziness or syncope.  She does have fluctuations in heart rate based on fitness tracker.  I reviewed her current medications.  In the past she was on atenolol and states that she tolerated this well.  Ziac has been less effective recently despite increase in dose.  I reviewed her recent lab work as outlined below.  She has a prior documented history of mitral regurgitation, mild previously, but not assessed in several years.  Past Medical History:  Diagnosis Date  . GERD (gastroesophageal reflux disease)   . Hyperlipidemia   . Palpitations   . Polycystic kidney disease     Past Surgical History:  Procedure Laterality Date  . Childbirth    . COLONOSCOPY    . TOTAL ABDOMINAL HYSTERECTOMY W/ BILATERAL SALPINGOOPHORECTOMY    . UPPER GASTROINTESTINAL ENDOSCOPY      Current Outpatient Medications  Medication Sig Dispense Refill  . aspirin EC 81 MG tablet Take 81 mg by mouth at bedtime.    Marland Kitchen diltiazem (CARTIA XT) 180 MG 24 hr capsule Take 1 capsule (180 mg total) by mouth daily. 90 capsule 3  . esomeprazole (NEXIUM) 20 MG capsule Take 20 mg by mouth 2  (two) times daily before a meal.    . fenofibrate 160 MG tablet Take 160 mg by mouth every morning.    . lansoprazole (PREVACID) 30 MG capsule Take 30 mg by mouth daily at 12 noon.    . metaxalone (SKELAXIN) 800 MG tablet Take 400 mg by mouth daily as needed for muscle spasms.     . montelukast (SINGULAIR) 10 MG tablet Take 10 mg by mouth every morning.     . rosuvastatin (CRESTOR) 40 MG tablet Take 10 mg by mouth every evening.    Marland Kitchen atenolol (TENORMIN) 25 MG tablet Take 1 tablet (25 mg total) by mouth 2 (two) times daily. 60 tablet 3  . hydrochlorothiazide (HYDRODIURIL) 25 MG tablet Take 0.5 tablets (12.5 mg total) by mouth daily. 30 tablet 3   No current facility-administered medications for this visit.   Allergies:  Codeine, Morphine, Predicort [prednisolone], and Penicillins   Social History: The patient  reports that she quit smoking about 8 years ago. Her smoking use included cigarettes. She has never used smokeless tobacco. She reports that she does not drink alcohol or use drugs.   Family History: The patient's family history includes Bladder Cancer in her father; Breast cancer in her paternal aunt and paternal aunt; Breast cancer (age of onset: 42) in her mother; Colon cancer (age of onset: 69) in her mother; Hyperlipidemia in her father and mother; Hypertension in her mother; Kidney  failure in her mother.   ROS:  Please see the history of present illness. Otherwise, complete review of systems is positive for none.  All other systems are reviewed and negative.   Physical Exam: VS:  BP (!) 147/80   Pulse 70   Temp (!) 96.8 F (36 C)   Ht 5\' 6"  (1.676 m)   Wt 179 lb (81.2 kg)   SpO2 98%   BMI 28.89 kg/m , BMI Body mass index is 28.89 kg/m.  Wt Readings from Last 3 Encounters:  03/25/19 179 lb (81.2 kg)  08/28/18 179 lb (81.2 kg)  08/14/17 179 lb (81.2 kg)    General: Patient appears comfortable at rest. HEENT: Conjunctiva and lids normal, wearing a mask. Neck: Supple, no  elevated JVP or carotid bruits, no thyromegaly. Lungs: Clear to auscultation, nonlabored breathing at rest. Cardiac: Regular rate and rhythm, no S3, soft significant systolic murmur. Abdomen: Soft, nontender, bowel sounds present. Extremities: No pitting edema, distal pulses 2+.  Skin: Warm and dry. Musculoskeletal: No kyphosis. Neuropsychiatric: Alert and oriented x3, affect grossly appropriate.  ECG:  An ECG dated 10/27/2013 was personally reviewed today and demonstrated:  Normal sinus rhythm.  Recent Labwork:  November 2020: Cholesterol 138, HDL 45, triglycerides 191, LDL 67, BUN 12, creatinine 0.61, potassium 4.9, AST 24, ALT 25, hemoglobin 13.9, platelets 354, hemoglobin A1c 5.9%, TSH 2.78  Other Studies Reviewed Today:  No interval cardiac testing for review today.  Assessment and Plan:  1.  Longstanding history of palpitations.  She has been on combination of calcium channel blocker and beta-blocker for quite some time although feels like Ziac has not been as effective as atenolol had been in the past.  We will continue Cardizem CD and switch from Ziac to atenolol 25 mg twice daily.  2.  Elevated blood pressure.  Change from Ziac to atenolol 25 mg twice daily and HCTZ 25 mg daily, otherwise continue Cardizem CD.  Continue to work on diet and weight loss.  3.  Mixed hyperlipidemia, currently on Crestor and fenofibrate.  Recent lipid panel showed triglycerides 191 and LDL 67.  4.  Mitral regurgitation by remote assessment, follow-up echocardiogram will be obtained.  Medication Adjustments/Labs and Tests Ordered: Current medicines are reviewed at length with the patient today.  Concerns regarding medicines are outlined above.   Tests Ordered: Orders Placed This Encounter  Procedures  . ECHOCARDIOGRAM COMPLETE    Medication Changes: Meds ordered this encounter  Medications  . atenolol (TENORMIN) 25 MG tablet    Sig: Take 1 tablet (25 mg total) by mouth 2 (two) times daily.     Dispense:  60 tablet    Refill:  3  . hydrochlorothiazide (HYDRODIURIL) 25 MG tablet    Sig: Take 0.5 tablets (12.5 mg total) by mouth daily.    Dispense:  30 tablet    Refill:  3    Disposition:  Follow up 6 weeks in the [place] office.  Signed, Satira Sark, MD, [facility] 03/25/2019 3:21 PM    [facility] at [facility] [address] [place], [place] [address] Phone: [phone]; Fax: [phone]

## 2019-03-25 ENCOUNTER — Encounter: Payer: Self-pay | Admitting: Cardiology

## 2019-03-25 ENCOUNTER — Ambulatory Visit: Payer: BC Managed Care – PPO | Admitting: Cardiology

## 2019-03-25 VITALS — BP 147/80 | HR 70 | Temp 96.8°F | Ht 66.0 in | Wt 179.0 lb

## 2019-03-25 DIAGNOSIS — R002 Palpitations: Secondary | ICD-10-CM

## 2019-03-25 DIAGNOSIS — I10 Essential (primary) hypertension: Secondary | ICD-10-CM

## 2019-03-25 DIAGNOSIS — I340 Nonrheumatic mitral (valve) insufficiency: Secondary | ICD-10-CM

## 2019-03-25 MED ORDER — ATENOLOL 25 MG PO TABS: 25.0000 mg | ORAL_TABLET | Freq: Two times a day (BID) | ORAL | 3 refills | Status: DC

## 2019-03-25 MED ORDER — HYDROCHLOROTHIAZIDE 25 MG PO TABS: 12.5000 mg | ORAL_TABLET | Freq: Every day | ORAL | 3 refills | Status: DC

## 2019-03-25 NOTE — Patient Instructions (Signed)
Medication Instructions:  STOP Ziac  START Atenolol 25 mg twice a day  START HCTZ 12.5 mg daily   *If you need a refill on your cardiac medications before your next appointment, please call your pharmacy*  Lab Work: NONE If you have labs (blood work) drawn today and your tests are completely normal, you will receive your results only by: Marland Kitchen MyChart Message (if you have MyChart) OR . A paper copy in the mail If you have any lab test that is abnormal or we need to change your treatment, we will call you to review the results.  Testing/Procedures: Your physician has requested that you have an echocardiogram. Echocardiography is a painless test that uses sound waves to create images of your heart. It provides your doctor with information about the size and shape of your heart and how well your heart's chambers and valves are working. This procedure takes approximately one hour. There are no restrictions for this procedure.    Follow-Up: At [facility], you and your health needs are our priority.  As part of our continuing mission to provide you with exceptional heart care, we have created designated Provider Care Teams.  These Care Teams include your primary Cardiologist (physician) and Advanced Practice Providers (APPs -  Physician Assistants and Nurse Practitioners) who all work together to provide you with the care you need, when you need it.  Your next appointment:   6 week(s)  The format for your next appointment:   In Person  Provider:   Rozann Lesches, MD  Other Instructions None      Thank you for choosing [place] [facility] !

## 2019-03-31 ENCOUNTER — Ambulatory Visit: Payer: BLUE CROSS/BLUE SHIELD | Admitting: Cardiology

## 2019-04-04 ENCOUNTER — Ambulatory Visit (HOSPITAL_COMMUNITY)
Admission: RE | Admit: 2019-04-04 | Discharge: 2019-04-04 | Disposition: A | Discharge status: Home / Self Care | Payer: BC Managed Care – PPO | Source: Ambulatory Visit | Attending: Cardiology | Admitting: Cardiology

## 2019-04-04 ENCOUNTER — Other Ambulatory Visit: Payer: Self-pay

## 2019-04-04 DIAGNOSIS — I340 Nonrheumatic mitral (valve) insufficiency: Secondary | ICD-10-CM

## 2019-04-04 NOTE — Progress Notes (Signed)
*  PRELIMINARY RESULTS* Echocardiogram 2D Echocardiogram has been performed.  Leavy Cella 04/04/2019, 9:16 AM

## 2019-05-12 ENCOUNTER — Ambulatory Visit: Payer: BC Managed Care – PPO | Admitting: Cardiology

## 2019-05-12 ENCOUNTER — Encounter: Payer: Self-pay | Admitting: Cardiology

## 2019-05-12 ENCOUNTER — Other Ambulatory Visit: Payer: Self-pay

## 2019-05-12 VITALS — BP 153/81 | HR 76 | Temp 97.5°F | Ht 66.0 in | Wt 178.0 lb

## 2019-05-12 DIAGNOSIS — R002 Palpitations: Secondary | ICD-10-CM | POA: Diagnosis not present

## 2019-05-12 DIAGNOSIS — I10 Essential (primary) hypertension: Secondary | ICD-10-CM | POA: Diagnosis not present

## 2019-05-12 MED ORDER — DILTIAZEM HCL ER COATED BEADS 240 MG PO CP24: 240.0000 mg | ORAL_CAPSULE | Freq: Every day | ORAL | 6 refills | Status: DC

## 2019-05-12 NOTE — Patient Instructions (Signed)
Medication Instructions:  STOP HCTZ   STOP Atenolol   INCREASE Cardizem to 240 mg daily    *If you need a refill on your cardiac medications before your next appointment, please call your pharmacy*   Lab Work: None today If you have labs (blood work) drawn today and your tests are completely normal, you will receive your results only by: Marland Kitchen MyChart Message (if you have MyChart) OR . A paper copy in the mail If you have any lab test that is abnormal or we need to change your treatment, we will call you to review the results.   Testing/Procedures: None today   Follow-Up: At [facility], you and your health needs are our priority.  As part of our continuing mission to provide you with exceptional heart care, we have created designated Provider Care Teams.  These Care Teams include your primary Cardiologist (physician) and Advanced Practice Providers (APPs -  Physician Assistants and Nurse Practitioners) who all work together to provide you with the care you need, when you need it.  We recommend signing up for the patient portal called "MyChart".  Sign up information is provided on this After Visit Summary.  MyChart is used to connect with patients for Virtual Visits (Telemedicine).  Patients are able to view lab/test results, encounter notes, upcoming appointments, etc.  Non-urgent messages can be sent to your provider as well.   To learn more about what you can do with MyChart, go to [email or web address].    Your next appointment:   6 week(s)  The format for your next appointment:   In Person  Provider:   Bernerd Pho, PA-C   Other Instructions None     Thank you for choosing [facility] !

## 2019-05-12 NOTE — Progress Notes (Signed)
Cardiology Office Note  Date: 05/12/2019   ID: Corliss, Cisson 11/03/1955, MRN [id number]  PCP:  [name], PA-C  Cardiologist:  Rozann Lesches, MD Electrophysiologist:  None   Chief Complaint  Patient presents with  . Follow-up palpitations    History of Present Illness: Shelley [name] is a 64 y.o. female last seen in January.  She presents for a follow-up visit.  At the last visit she continued on Cardizem CD and we switched from [place] to atenolol (she recalled doing better on atenolol in the past) and also added HCTZ.  She states that she felt worse on that combination of medications, palpitations were more frequent, also blood pressure not controlled.  She ended up going back to Ziac 5 mg / 6.25 mg 1 tablet twice daily, and taking an additional atenolol in the middle of the day.  I talked with her today about continuing Ziac, but not staying on atenolol, with an increase in Cardizem CD 240 mg daily instead.  Follow-up echocardiogram from January revealed LVEF 60 to 65% with moderate LVH and grade 2 diastolic dysfunction, no clinically significant valvular disease.  She has an apple watch, showed me 3 separate strips that she obtained during sense of palpitations, all showing sinus rhythm.  I did talk with her about considering a Zio patch for longer-term monitoring if her symptoms worsen, she wanted to hold off for now.  Past Medical History:  Diagnosis Date  . GERD (gastroesophageal reflux disease)   . Hyperlipidemia   . Palpitations   . Polycystic kidney disease     Past Surgical History:  Procedure Laterality Date  . Childbirth    . COLONOSCOPY    . TOTAL ABDOMINAL HYSTERECTOMY W/ BILATERAL SALPINGOOPHORECTOMY    . UPPER GASTROINTESTINAL ENDOSCOPY      Current Outpatient Medications  Medication Sig Dispense Refill  . aspirin EC 81 MG tablet Take 81 mg by mouth at bedtime.    . bisoprolol-hydrochlorothiazide (ZIAC) 5-6.25 MG tablet Take 1 tablet by  mouth 2 (two) times daily. Pt taking two times daily.    Marland Kitchen diltiazem (CARTIA XT) 240 MG 24 hr capsule Take 1 capsule (240 mg total) by mouth daily. 30 capsule 6  . fenofibrate 160 MG tablet Take 160 mg by mouth every morning.    . lansoprazole (PREVACID) 30 MG capsule Take 30 mg by mouth daily at 12 noon.    . metaxalone (SKELAXIN) 800 MG tablet Take 400 mg by mouth daily as needed for muscle spasms.     . montelukast (SINGULAIR) 10 MG tablet Take 10 mg by mouth every morning.     . rosuvastatin (CRESTOR) 40 MG tablet Take 20 mg by mouth every evening.      No current facility-administered medications for this visit.   Allergies:  Codeine, Morphine, Predicort [prednisolone], and Penicillins   ROS:  No syncope.  Physical Exam: VS:  BP (!) 153/81   Pulse 76   Temp (!) 97.5 F (36.4 C)   Ht 5\' 6"  (1.676 m)   Wt 178 lb (80.7 kg)   SpO2 99%   BMI 28.73 kg/m , BMI Body mass index is 28.73 kg/m.  Wt Readings from Last 3 Encounters:  05/12/19 178 lb (80.7 kg)  03/25/19 179 lb (81.2 kg)  08/28/18 179 lb (81.2 kg)    General: Patient appears comfortable at rest. HEENT: Conjunctiva and lids normal, wearing a mask. Neck: Supple, no elevated JVP or carotid bruits, no thyromegaly. Lungs:  Clear to auscultation, nonlabored breathing at rest. Cardiac: Regular rate and rhythm, no S3 or significant systolic murmur, no pericardial rub. Abdomen: Soft, bowel sounds present. Extremities: No pitting edema, distal pulses 2+.  ECG:  An ECG dated 10/27/2013 was personally reviewed today and demonstrated:  Normal sinus rhythm.  Recent Labwork:  November 2020: Cholesterol 138, HDL 45, triglycerides 191, LDL 67, BUN 12, creatinine 0.61, potassium 4.9, AST 24, ALT 25, hemoglobin 13.9, platelets 354, hemoglobin A1c 5.9%, TSH 2.78  Other Studies Reviewed Today:  Echocardiogram 04/04/2019: 1. Left ventricular ejection fraction, by visual estimation, is 60 to  65%. The left ventricle has normal  function. There is moderately increased  left ventricular hypertrophy.  2. Left ventricular diastolic parameters are consistent with Grade II  diastolic dysfunction (pseudonormalization).  3. The left ventricle has no regional wall motion abnormalities.  4. Global right ventricle has normal systolic function.The right  ventricular size is normal. No increase in right ventricular wall  thickness.  5. Left atrial size was normal.  6. Right atrial size was normal.  7. Mild mitral annular calcification.  8. The mitral valve is grossly normal. No evidence of mitral valve  regurgitation.  9. The tricuspid valve is grossly normal.  10. The tricuspid valve is grossly normal. Tricuspid valve regurgitation  is not demonstrated.  11. The aortic valve was not well visualized. Aortic valve regurgitation  is not visualized. No evidence of aortic valve sclerosis or stenosis.  12. The pulmonic valve was not well visualized. Pulmonic valve  regurgitation is not visualized.  13. The inferior vena cava is normal in size with greater than 50%  respiratory variability, suggesting right atrial pressure of 3 mmHg.  14. The interatrial septum was not well visualized.   Assessment and Plan:  1.  Longstanding, recurring palpitations with no clearly documented arrhythmia over time.  Plan is to continue Ziac 5 mg / 6.25 mg twice daily, increase Cardizem CD to 240 mg daily.  I did talk with her about considering a Zio patch if symptoms do not improve, otherwise her apple watch has not called any obvious atrial fibrillation.  LVEF is normal and there are no major valvular abnormalities by recent follow-up echocardiogram.  2.  Essential hypertension, blood pressure elevated today.  She did not tolerate atenolol and switch to HCTZ.  For now continue Ziac and increase Cardizem CD dose.  Medication Adjustments/Labs and Tests Ordered: Current medicines are reviewed at length with the patient today.  Concerns  regarding medicines are outlined above.   Tests Ordered: No orders of the defined types were placed in this encounter.   Medication Changes: Meds ordered this encounter  Medications  . DISCONTD: diltiazem (CARTIA XT) 240 MG 24 hr capsule    Sig: Take 1 capsule (240 mg total) by mouth daily.    Dispense:  30 capsule    Refill:  6  . diltiazem (CARTIA XT) 240 MG 24 hr capsule    Sig: Take 1 capsule (240 mg total) by mouth daily.    Dispense:  30 capsule    Refill:  6    Disposition:  Follow up 6 weeks in the [place] office with [place].  Signed, Satira Sark, MD, [facility] 05/12/2019 8:51 AM    [facility] at [facility] [address], [place], [place] [address] Phone: [phone]; Fax: [phone]

## 2019-05-18 ENCOUNTER — Ambulatory Visit: Payer: BC Managed Care – PPO | Attending: Internal Medicine

## 2019-05-18 DIAGNOSIS — Z23 Encounter for immunization: Secondary | ICD-10-CM | POA: Insufficient documentation

## 2019-05-18 NOTE — Progress Notes (Signed)
   Covid-19 Vaccination Clinic  Name:  KADEN NEUBERGER    MRN: [id number] DOB: 10-28-1955  05/18/2019  Ms. Benites was observed post Covid-19 immunization for 15 minutes without incident. She was provided with Vaccine Information Sheet and instruction to access the V-Safe system.   Ms. Camber was instructed to call 911 with any severe reactions post vaccine: Marland Kitchen Difficulty breathing  . Swelling of face and throat  . A fast heartbeat  . A bad rash all over body  . Dizziness and weakness   Immunizations Administered    Name Date Dose VIS Date Route   Pfizer COVID-19 Vaccine 05/18/2019 10:05 AM 0.3 mL 02/21/2019 Intramuscular   Manufacturer: [place]   Lot: [id number]   [place]: [id number]

## 2019-06-08 ENCOUNTER — Ambulatory Visit: Payer: Self-pay | Attending: Internal Medicine

## 2019-06-08 DIAGNOSIS — Z23 Encounter for immunization: Secondary | ICD-10-CM

## 2019-06-08 NOTE — Progress Notes (Signed)
   Covid-19 Vaccination Clinic  Name:  Shelley [name]    MRN: [id number] DOB: Jan 30, 1956  06/08/2019  [name] was observed post Covid-19 immunization for 15 minutes without incident. She was provided with Vaccine Information Sheet and instruction to access the V-Safe system.   [name] was instructed to call 911 with any severe reactions post vaccine: Marland Kitchen Difficulty breathing  . Swelling of face and throat  . A fast heartbeat  . A bad rash all over body  . Dizziness and weakness   Immunizations Administered    Name Date Dose VIS Date Route   Pfizer COVID-19 Vaccine 06/08/2019  9:37 AM 0.3 mL 02/21/2019 Intramuscular   Manufacturer: [place]   Lot: [id number]   [place]: [id number]

## 2019-06-12 DIAGNOSIS — L821 Other seborrheic keratosis: Secondary | ICD-10-CM | POA: Diagnosis not present

## 2019-06-26 ENCOUNTER — Other Ambulatory Visit: Payer: Self-pay

## 2019-06-26 ENCOUNTER — Ambulatory Visit: Payer: BC Managed Care – PPO | Admitting: Student

## 2019-06-26 ENCOUNTER — Encounter: Payer: Self-pay | Admitting: Student

## 2019-06-26 VITALS — BP 126/76 | HR 78 | Ht 66.0 in | Wt 180.0 lb

## 2019-06-26 DIAGNOSIS — R002 Palpitations: Secondary | ICD-10-CM | POA: Diagnosis not present

## 2019-06-26 DIAGNOSIS — I10 Essential (primary) hypertension: Secondary | ICD-10-CM

## 2019-06-26 MED ORDER — DILTIAZEM HCL ER COATED BEADS 180 MG PO CP24: 180.0000 mg | ORAL_CAPSULE | Freq: Every day | ORAL | 3 refills | Status: DC

## 2019-06-26 NOTE — Patient Instructions (Signed)

## 2019-06-26 NOTE — Progress Notes (Signed)
Cardiology Office Note    Date:  06/26/2019   ID:  Shelley [name], DOB Dec 03, 1955, MRN [id number]  PCP:  [name], PA-C  Cardiologist: Rozann Lesches, MD    Chief Complaint  Patient presents with  . Follow-up    6 week visit    History of Present Illness:    Shelley [name] is a 64 y.o. female with past medical history of palpitations and HTN who presents to the office today for 6-week follow-up.  She was last examined by Dr. Domenic Polite in 05/2019 and had been switched from [place] to Atenolol and HCTZ alone while continuing on Cardizem CD to see if this would help with palpitations but she ended up going back to Ziac 5-6.25mg  BID due to not feeling well with the medication changes. She had been recording rhythm strips with her iWatch and these showed normal sinus rhythm at the time of having palpitations. She was continued on Ziac 5-6.25mg  BID and Cardizem CD was increased from 180mg  daily to 240mg  daily.   In talking with the patient today, she reports being on the increased dose of Cardizem CD for approximately one week then returned to using her prior 180mg  capsules due to significantly worsening fatigue with the higher dose. Her palpitations have overall improved within the past few weeks and when they do occur, she describes it as a pause in her pulse then returning to baseline. No tachy-palpitations. She denies any associated dizziness or presyncope. Breathing has been at baseline and no recent orthopnea, PND or edema. No exertional chest pain. She has been under increased stress secondary to her job.   She denies any alcohol use and only consumes 1 cup of coffee daily.     Past Medical History:  Diagnosis Date  . GERD (gastroesophageal reflux disease)   . Hyperlipidemia   . Palpitations   . Polycystic kidney disease     Past Surgical History:  Procedure Laterality Date  . Childbirth    . COLONOSCOPY    . TOTAL ABDOMINAL HYSTERECTOMY W/ BILATERAL  SALPINGOOPHORECTOMY    . UPPER GASTROINTESTINAL ENDOSCOPY      Current Medications: Outpatient Medications Prior to Visit  Medication Sig Dispense Refill  . aspirin EC 81 MG tablet Take 81 mg by mouth at bedtime.    . bisoprolol-hydrochlorothiazide (ZIAC) 5-6.25 MG tablet Take 1 tablet by mouth 2 (two) times daily. Pt taking two times daily.    . fenofibrate 160 MG tablet Take 160 mg by mouth every morning.    . lansoprazole (PREVACID) 30 MG capsule Take 30 mg by mouth daily at 12 noon.    . metaxalone (SKELAXIN) 800 MG tablet Take 400 mg by mouth daily as needed for muscle spasms.     . montelukast (SINGULAIR) 10 MG tablet Take 10 mg by mouth every morning.     . rosuvastatin (CRESTOR) 40 MG tablet Take 20 mg by mouth every evening.     . diltiazem (CARTIA XT) 240 MG 24 hr capsule Take 1 capsule (240 mg total) by mouth daily. 30 capsule 6   No facility-administered medications prior to visit.     Allergies:   Codeine, Morphine, Predicort [prednisolone], and Penicillins   Social History   Socioeconomic History  . Marital status: Divorced    Spouse name: Not on file  . Number of children: Not on file  . Years of education: Not on file  . Highest education level: Not on file  Occupational History  . Occupation:  Full time   . Occupation: HR    Employer: [employer]  Tobacco Use  . Smoking status: Former Smoker    Types: Cigarettes    Quit date: 11/26/2010    Years since quitting: 8.5  . Smokeless tobacco: Never Used  . Tobacco comment: 5 ciggs per week  Substance and Sexual Activity  . Alcohol use: No  . Drug use: No  . Sexual activity: Not Currently    Comment: 1st intercourse 64 YEARS OLD, Fewer than 5 PARTNERS,des neg  Other Topics Concern  . Not on file  Social History Narrative  . Not on file   Social Determinants of Health   Financial Resource Strain:   . Difficulty of Paying Living Expenses:   Food Insecurity:   . Worried About Charity fundraiser in the Last  Year:   . Arboriculturist in the Last Year:   Transportation Needs:   . Film/video editor (Medical):   Marland Kitchen Lack of Transportation (Non-Medical):   Physical Activity:   . Days of Exercise per Week:   . Minutes of Exercise per Session:   Stress:   . Feeling of Stress :   Social Connections:   . Frequency of Communication with Friends and Family:   . Frequency of Social Gatherings with Friends and Family:   . Attends Religious Services:   . Active Member of Clubs or Organizations:   . Attends Archivist Meetings:   Marland Kitchen Marital Status:      Family History:  The patient's family history includes Bladder Cancer in her father; Breast cancer in her paternal aunt and paternal aunt; Breast cancer (age of onset: 1) in her mother; Colon cancer (age of onset: 50) in her mother; Hyperlipidemia in her father and mother; Hypertension in her mother; Kidney failure in her mother.   Review of Systems:   Please see the history of present illness.     General:  No chills, fever, night sweats or weight changes.  Cardiovascular:  No chest pain, dyspnea on exertion, edema, orthopnea, paroxysmal nocturnal dyspnea. Positive for palpitations.  Dermatological: No rash, lesions/masses Respiratory: No cough, dyspnea Urologic: No hematuria, dysuria Abdominal:   No nausea, vomiting, diarrhea, bright red blood per rectum, melena, or hematemesis Neurologic:  No visual changes, wkns, changes in mental status. All other systems reviewed and are otherwise negative except as noted above.   Physical Exam:    VS:  BP 126/76 (BP Location: Left Arm)   Pulse 78   Ht 5\' 6"  (1.676 m)   Wt 180 lb (81.6 kg)   SpO2 97%   BMI 29.05 kg/m    General: Well developed, well nourished,female appearing in no acute distress. Head: Normocephalic, atraumatic, sclera non-icteric.  Neck: No carotid bruits. JVD not elevated.  Lungs: Respirations regular and unlabored, without wheezes or rales.  Heart: Regular rate and  rhythm. No S3 or S4.  No murmur, no rubs, or gallops appreciated. Abdomen: Soft, non-tender, non-distended. No obvious abdominal masses. Msk:  Strength and tone appear normal for age. No obvious joint deformities or effusions. Extremities: No clubbing or cyanosis. No lower extremity edema.  Distal pedal pulses are 2+ bilaterally. Neuro: Alert and oriented X 3. Moves all extremities spontaneously. No focal deficits noted. Psych:  Responds to questions appropriately with a normal affect. Skin: No rashes or lesions noted  Wt Readings from Last 3 Encounters:  06/26/19 180 lb (81.6 kg)  05/12/19 178 lb (80.7 kg)  03/25/19 179 lb (  81.2 kg)     Studies/Labs Reviewed:   EKG:  EKG is not ordered today.   Recent Labs: No results found for requested labs within last 8760 hours.   Lipid Panel No results found for: CHOL, TRIG, HDL, CHOLHDL, VLDL, LDLCALC, LDLDIRECT  Additional studies/ records that were reviewed today include:   Echocardiogram: 03/2019 IMPRESSIONS    1. Left ventricular ejection fraction, by visual estimation, is 60 to  65%. The left ventricle has normal function. There is moderately increased  left ventricular hypertrophy.  2. Left ventricular diastolic parameters are consistent with Grade II  diastolic dysfunction (pseudonormalization).  3. The left ventricle has no regional wall motion abnormalities.  4. Global right ventricle has normal systolic function.The right  ventricular size is normal. No increase in right ventricular wall  thickness.  5. Left atrial size was normal.  6. Right atrial size was normal.  7. Mild mitral annular calcification.  8. The mitral valve is grossly normal. No evidence of mitral valve  regurgitation.  9. The tricuspid valve is grossly normal.  10. The tricuspid valve is grossly normal. Tricuspid valve regurgitation  is not demonstrated.  11. The aortic valve was not well visualized. Aortic valve regurgitation  is not  visualized. No evidence of aortic valve sclerosis or stenosis.  12. The pulmonic valve was not well visualized. Pulmonic valve  regurgitation is not visualized.  13. The inferior vena cava is normal in size with greater than 50%  respiratory variability, suggesting right atrial pressure of 3 mmHg.  14. The interatrial septum was not well visualized.   Assessment:    1. Palpitations   2. Essential hypertension      Plan:   In order of problems listed above:  1. Palpitations - symptoms seem most consistent with PAC's or PVC's by her description and she denies any associated symptoms or tachy-palpitations. Unable to tolerate a higher dose of Cardizem CD and we did discuss today switching her Bisoprolol to a different beta blocker such as Lopressor or Coreg but she does not wish to undergo further changes in her medication regimen at this time given that symptoms have improved. I encouraged her to make [place] aware if symptoms progress in the interim.  - she has routine labs at her workplace every 3 months and was recently informed her electrolytes and TSH were within a normal range. We reviewed a possible monitor but she wishes to continue to use her iWatch for now which certainly seems reasonable.   2. HTN - BP is well-controlled at 126/76 during today's visit. Continue current medication regimen with Ziac 5-6.25mg  BID and Cardizem CD 180mg  daily.    Medication Adjustments/Labs and Tests Ordered: Current medicines are reviewed at length with the patient today.  Concerns regarding medicines are outlined above.  Medication changes, Labs and Tests ordered today are listed in the Patient Instructions below. Patient Instructions  Medication Instructions:  Your physician recommends that you continue on your current medications as directed. Please refer to the Current Medication list given to you today.   Labwork: NONE  Testing/Procedures: NONE  Follow-Up: Your physician wants you to  follow-up in: 1 YEAR.  You will receive a reminder letter in the mail two months in advance. If you don't receive a letter, please call our office to schedule the follow-up appointment.  Any Other Special Instructions Will Be Listed Below (If Applicable).  If you need a refill on your cardiac medications before your next appointment, please call your pharmacy.  Signed, Erma Heritage, PA-C  06/26/2019 8:11 PM    [facility] [address] [place], [place] [address] Phone: [phone] Fax: [phone]

## 2019-07-08 ENCOUNTER — Telehealth: Payer: Self-pay | Admitting: *Deleted

## 2019-07-08 MED ORDER — ESTRADIOL 0.1 MG/GM VA CREA: 1.0000 | TOPICAL_CREAM | VAGINAL | 0 refills | Status: DC

## 2019-07-08 NOTE — Telephone Encounter (Signed)
Okay for refill of Estrace cream 0.625 per vagina twice weekly and have her call if it is not covered by her insurance,.  Also tell her expired estrogen cream okay to use may not be as strong.

## 2019-07-08 NOTE — Telephone Encounter (Signed)
Patient called requesting Rx for estrace cream or premarin cream to help with vaginal dryness externally only. Has tube at home, however tube expired. Patient is not having any vaginal itching, odor or discharge. Reports only externally vaginal dryness only. Please advise

## 2019-07-08 NOTE — Telephone Encounter (Signed)
Patient informed, Rx sent.  

## 2019-07-29 DIAGNOSIS — M26633 Articular disc disorder of bilateral temporomandibular joint: Secondary | ICD-10-CM | POA: Diagnosis not present

## 2019-08-05 DIAGNOSIS — Z1389 Encounter for screening for other disorder: Secondary | ICD-10-CM | POA: Diagnosis not present

## 2019-08-05 DIAGNOSIS — R5383 Other fatigue: Secondary | ICD-10-CM | POA: Diagnosis not present

## 2019-08-05 DIAGNOSIS — Z6830 Body mass index (BMI) 30.0-30.9, adult: Secondary | ICD-10-CM | POA: Diagnosis not present

## 2019-08-05 DIAGNOSIS — E6609 Other obesity due to excess calories: Secondary | ICD-10-CM | POA: Diagnosis not present

## 2019-08-15 DIAGNOSIS — G4730 Sleep apnea, unspecified: Secondary | ICD-10-CM | POA: Diagnosis not present

## 2019-08-16 DIAGNOSIS — G4730 Sleep apnea, unspecified: Secondary | ICD-10-CM | POA: Diagnosis not present

## 2019-08-28 DIAGNOSIS — G4733 Obstructive sleep apnea (adult) (pediatric): Secondary | ICD-10-CM | POA: Diagnosis not present

## 2019-09-01 ENCOUNTER — Other Ambulatory Visit: Payer: Self-pay

## 2019-09-02 ENCOUNTER — Encounter: Payer: Self-pay | Admitting: Nurse Practitioner

## 2019-09-02 ENCOUNTER — Ambulatory Visit (INDEPENDENT_AMBULATORY_CARE_PROVIDER_SITE_OTHER): Payer: BC Managed Care – PPO | Admitting: Nurse Practitioner

## 2019-09-02 VITALS — BP 126/80 | Ht 66.0 in | Wt 182.0 lb

## 2019-09-02 DIAGNOSIS — Z01419 Encounter for gynecological examination (general) (routine) without abnormal findings: Secondary | ICD-10-CM | POA: Diagnosis not present

## 2019-09-02 DIAGNOSIS — Z90710 Acquired absence of both cervix and uterus: Secondary | ICD-10-CM

## 2019-09-02 DIAGNOSIS — N951 Menopausal and female climacteric states: Secondary | ICD-10-CM | POA: Diagnosis not present

## 2019-09-02 DIAGNOSIS — Z90722 Acquired absence of ovaries, bilateral: Secondary | ICD-10-CM

## 2019-09-02 DIAGNOSIS — Z9079 Acquired absence of other genital organ(s): Secondary | ICD-10-CM

## 2019-09-02 DIAGNOSIS — Z1231 Encounter for screening mammogram for malignant neoplasm of breast: Secondary | ICD-10-CM | POA: Diagnosis not present

## 2019-09-02 MED ORDER — ESTRADIOL 0.1 MG/GM VA CREA: 1.0000 | TOPICAL_CREAM | VAGINAL | 3 refills | Status: DC

## 2019-09-02 NOTE — Patient Instructions (Signed)
Health Maintenance, Female Adopting a healthy lifestyle and getting preventive care are important in promoting health and wellness. Ask your health care provider about:  The right schedule for you to have regular tests and exams.  Things you can do on your own to prevent diseases and keep yourself healthy. What should I know about diet, weight, and exercise? Eat a healthy diet   Eat a diet that includes plenty of vegetables, fruits, low-fat dairy products, and lean protein.  Do not eat a lot of foods that are high in solid fats, added sugars, or sodium. Maintain a healthy weight Body mass index (BMI) is used to identify weight problems. It estimates body fat based on height and weight. Your health care provider can help determine your BMI and help you achieve or maintain a healthy weight. Get regular exercise Get regular exercise. This is one of the most important things you can do for your health. Most adults should:  Exercise for at least 150 minutes each week. The exercise should increase your heart rate and make you sweat (moderate-intensity exercise).  Do strengthening exercises at least twice a week. This is in addition to the moderate-intensity exercise.  Spend less time sitting. Even light physical activity can be beneficial. Watch cholesterol and blood lipids Have your blood tested for lipids and cholesterol at 62 years of age, then have this test every 5 years. Have your cholesterol levels checked more often if:  Your lipid or cholesterol levels are high.  You are older than 62 years of age.  You are at high risk for heart disease. What should I know about cancer screening? Depending on your health history and family history, you may need to have cancer screening at various ages. This may include screening for:  Breast cancer.  Cervical cancer.  Colorectal cancer.  Skin cancer.  Lung cancer. What should I know about heart disease, diabetes, and high blood  pressure? Blood pressure and heart disease  High blood pressure causes heart disease and increases the risk of stroke. This is more likely to develop in people who have high blood pressure readings, are of African descent, or are overweight.  Have your blood pressure checked: ? Every 3-5 years if you are 18-39 years of age. ? Every year if you are 40 years old or older. Diabetes Have regular diabetes screenings. This checks your fasting blood sugar level. Have the screening done:  Once every three years after age 40 if you are at a normal weight and have a low risk for diabetes.  More often and at a younger age if you are overweight or have a high risk for diabetes. What should I know about preventing infection? Hepatitis B If you have a higher risk for hepatitis B, you should be screened for this virus. Talk with your health care provider to find out if you are at risk for hepatitis B infection. Hepatitis C Testing is recommended for:  Everyone born from 1945 through 1965.  Anyone with known risk factors for hepatitis C. Sexually transmitted infections (STIs)  Get screened for STIs, including gonorrhea and chlamydia, if: ? You are sexually active and are younger than 62 years of age. ? You are older than 62 years of age and your health care provider tells you that you are at risk for this type of infection. ? Your sexual activity has changed since you were last screened, and you are at increased risk for chlamydia or gonorrhea. Ask your health care provider if   you are at risk.  Ask your health care provider about whether you are at high risk for HIV. Your health care provider may recommend a prescription medicine to help prevent HIV infection. If you choose to take medicine to prevent HIV, you should first get tested for HIV. You should then be tested every 3 months for as long as you are taking the medicine. Pregnancy  If you are about to stop having your period (premenopausal) and  you may become pregnant, seek counseling before you get pregnant.  Take 400 to 800 micrograms (mcg) of folic acid every day if you become pregnant.  Ask for birth control (contraception) if you want to prevent pregnancy. Osteoporosis and menopause Osteoporosis is a disease in which the bones lose minerals and strength with aging. This can result in bone fractures. If you are 65 years old or older, or if you are at risk for osteoporosis and fractures, ask your health care provider if you should:  Be screened for bone loss.  Take a calcium or vitamin D supplement to lower your risk of fractures.  Be given hormone replacement therapy (HRT) to treat symptoms of menopause. Follow these instructions at home: Lifestyle  Do not use any products that contain nicotine or tobacco, such as cigarettes, e-cigarettes, and chewing tobacco. If you need help quitting, ask your health care provider.  Do not use street drugs.  Do not share needles.  Ask your health care provider for help if you need support or information about quitting drugs. Alcohol use  Do not drink alcohol if: ? Your health care provider tells you not to drink. ? You are pregnant, may be pregnant, or are planning to become pregnant.  If you drink alcohol: ? Limit how much you use to 0-1 drink a day. ? Limit intake if you are breastfeeding.  Be aware of how much alcohol is in your drink. In the [place] one drink equals one 12 oz bottle of beer (355 mL), one 5 oz glass of wine (148 mL), or one 1 oz glass of hard liquor (44 mL). General instructions  Schedule regular health, dental, and eye exams.  Stay current with your vaccines.  Tell your health care provider if: ? You often feel depressed. ? You have ever been abused or do not feel safe at home. Summary  Adopting a healthy lifestyle and getting preventive care are important in promoting health and wellness.  Follow your health care provider's instructions about healthy  diet, exercising, and getting tested or screened for diseases.  Follow your health care provider's instructions on monitoring your cholesterol and blood pressure. This information is not intended to replace advice given to you by your health care provider. Make sure you discuss any questions you have with your health care provider. Document Revised: 02/20/2018 Document Reviewed: 02/20/2018 Elsevier Patient Education  2020 Elsevier Inc[employer]  

## 2019-09-02 NOTE — Progress Notes (Signed)
   Shelley [name] September 13, 1955 [id number]   History:  64 y.o. G2 P1 presents for annual exam. 2003 supracervical TAH/BSO for fibroids and endometriosis.  Normal Pap and mammogram history. Has had increase vaginal dryness. Currently uses 0.5gm of Estradiol vaginal cream twice weekly. Primary care manages hypertension, GERD, HLD.  Gets bone density done at work, we do not have these records but patient reports normal.  Mother with history of breast cancer and colon cancer.  Gynecologic History No LMP recorded. Patient has had a hysterectomy.   Contraception: status post hysterectomy Last Pap: 08/28/2018. Results were: normal Last mammogram: 08/28/2018. Results were: normal Last colonoscopy: 11/10/2014. Results were: Severe diverticulosis, 5-year repeat Last Dexa: 2020. Results were: normal per pt  Past medical history, past surgical history, family history and social history were all reviewed and documented in the EPIC chart.  ROS:  A ROS was performed and pertinent positives and negatives are included.  Exam:  Vitals:   09/02/19 0917  Weight: 182 lb (82.6 kg)  Height: 5\' 6"  (1.676 m)   Body mass index is 29.38 kg/m.  General appearance:  Normal Thyroid:  Symmetrical, normal in size, without palpable masses or nodularity. Respiratory  Auscultation:  Clear without wheezing or rhonchi Cardiovascular  Auscultation:  Regular rate, without rubs, murmurs or gallops  Edema/varicosities:  Not grossly evident Abdominal  Soft,nontender, without masses, guarding or rebound.  Liver/spleen:  No organomegaly noted  Hernia:  None appreciated  Skin  Inspection:  Grossly normal   Breasts: Examined lying and sitting.   Right: Without masses, retractions, discharge or axillary adenopathy.   Left: Without masses, retractions, discharge or axillary adenopathy. Gentitourinary   Inguinal/mons:  Normal without inguinal adenopathy  External genitalia:  Normal  BUS/Urethra/Skene's glands:   Normal  Vagina:  Normal, atrophic changes, 1+ cystocele/rectocele  Cervix:  Normal  Uterus:  Absent  Adnexa/parametria:     Rt: Without masses or tenderness.   Lt: Without masses or tenderness.  Anus and perineum: Normal  Digital rectal exam: Normal sphincter tone without palpated masses or tenderness  Assessment/Plan:  64 y.o. G2 P1 for annual exam.    Well female exam with routine gynecological exam - Education provided on SBEs, importance of preventative screenings, current guidelines, high calcium diet, regular exercise, and multivitamin daily.   Menopausal vaginal dryness - Plan: estradiol (ESTRACE VAGINAL) 0.1 MG/GM vaginal cream. Increase to 1gm daily for 2 weeks then decrease back to 1 mg twice weekly  History of total abdominal hysterectomy and bilateral salpingo-oophorectomy - 2003 Supracervical. Pap 08/28/2018 normal.   Follow up in 1 year for annual      [place], 9:18 AM 09/02/2019

## 2019-09-11 DIAGNOSIS — G4733 Obstructive sleep apnea (adult) (pediatric): Secondary | ICD-10-CM | POA: Diagnosis not present

## 2019-09-23 DIAGNOSIS — H40013 Open angle with borderline findings, low risk, bilateral: Secondary | ICD-10-CM | POA: Diagnosis not present

## 2019-10-12 DIAGNOSIS — G4733 Obstructive sleep apnea (adult) (pediatric): Secondary | ICD-10-CM | POA: Diagnosis not present

## 2019-10-30 ENCOUNTER — Telehealth: Payer: Self-pay | Admitting: Internal Medicine

## 2019-10-30 NOTE — Telephone Encounter (Signed)
Okay to schedule with me.  Thank you 

## 2019-10-30 NOTE — Telephone Encounter (Signed)
Hey Dr Tarri Glenn, this pt is wishing to establish care with you,she is due for a 5 year recall 10/12/2019, she does not wish to continue seeing Dr Carlean Purl. Please advise on scheduling if you would take her as a new pt.

## 2019-10-31 NOTE — Telephone Encounter (Signed)
Sure

## 2019-11-04 DIAGNOSIS — G4733 Obstructive sleep apnea (adult) (pediatric): Secondary | ICD-10-CM | POA: Diagnosis not present

## 2019-11-12 DIAGNOSIS — G4733 Obstructive sleep apnea (adult) (pediatric): Secondary | ICD-10-CM | POA: Diagnosis not present

## 2019-11-19 DIAGNOSIS — N390 Urinary tract infection, site not specified: Secondary | ICD-10-CM | POA: Diagnosis not present

## 2019-11-21 ENCOUNTER — Encounter: Payer: Self-pay | Admitting: Gastroenterology

## 2019-11-22 ENCOUNTER — Ambulatory Visit
Admission: EM | Admit: 2019-11-22 | Discharge: 2019-11-22 | Disposition: A | Discharge status: Home / Self Care | Payer: BC Managed Care – PPO | Attending: Emergency Medicine | Admitting: Emergency Medicine

## 2019-11-22 ENCOUNTER — Encounter: Payer: Self-pay | Admitting: Emergency Medicine

## 2019-11-22 ENCOUNTER — Other Ambulatory Visit: Payer: Self-pay

## 2019-11-22 DIAGNOSIS — K5732 Diverticulitis of large intestine without perforation or abscess without bleeding: Secondary | ICD-10-CM | POA: Diagnosis not present

## 2019-11-22 HISTORY — DX: Diverticulitis of intestine, part unspecified, without perforation or abscess without bleeding: K57.92

## 2019-11-22 MED ORDER — METRONIDAZOLE 500 MG PO TABS: 500.0000 mg | ORAL_TABLET | Freq: Two times a day (BID) | ORAL | 0 refills | Status: DC

## 2019-11-22 MED ORDER — CIPROFLOXACIN HCL 500 MG PO TABS: 500.0000 mg | ORAL_TABLET | Freq: Two times a day (BID) | ORAL | 0 refills | Status: AC

## 2019-11-22 NOTE — Discharge Instructions (Addendum)
Metronidazole and Cipro for Staxyn were prescribed Take medication as directed and to completion Follow with PCP/GI for further reevaluation Return or go to ED for worsening of symptoms

## 2019-11-22 NOTE — ED Triage Notes (Signed)
Abd discomfort. Diarrhea/ constipation.  Pt reports she has a history of diverticulitis and this is what is feels like.

## 2019-11-22 NOTE — ED Provider Notes (Signed)
[place]   [id number] 11/22/19 Arrival Time: 3007  CC: ABDOMINAL DISCOMFORT  SUBJECTIVE:  Shelley [name] is a 64 y.o. female history of diverticulitis presented to the urgent care with a complaint of abdominal discomfort, diarrhea and constipation for the past few days.  Denies a precipitating event, trauma, close contacts with similar symptoms, recent travel or antibiotic use.  Localizes pain to generalized abdomen.  Describes as intermittent, cramping and achy.  Has not tried any OTC medication and is here to get a Cipro and metronidazole prescribed.  Denies alleviating or aggravating factors.  Report similar symptoms in the past.  Denies chills, fever, nausea, vomiting.  No LMP recorded. Patient has had a hysterectomy.  ROS: As per HPI.  All other pertinent ROS negative.     Past Medical History:  Diagnosis Date  . Diverticulitis   . GERD (gastroesophageal reflux disease)   . Hyperlipidemia   . Palpitations   . Polycystic kidney disease    Past Surgical History:  Procedure Laterality Date  . Childbirth    . COLONOSCOPY    . TOTAL ABDOMINAL HYSTERECTOMY W/ BILATERAL SALPINGOOPHORECTOMY    . UPPER GASTROINTESTINAL ENDOSCOPY     Allergies  Allergen Reactions  . Codeine Nausea And Vomiting  . Morphine Nausea And Vomiting  . Predicort [Prednisolone] Other (See Comments)    Cannot take orally, states that she can take via injection   . Penicillins Swelling and Rash    Childhood reaction   No current facility-administered medications on file prior to encounter.   Current Outpatient Medications on File Prior to Encounter  Medication Sig Dispense Refill  . aspirin EC 81 MG tablet Take 81 mg by mouth at bedtime.    . bisoprolol-hydrochlorothiazide (ZIAC) 5-6.25 MG tablet Take 1 tablet by mouth 2 (two) times daily. Pt taking two times daily.    Marland Kitchen diltiazem (CARTIA XT) 180 MG 24 hr capsule Take 1 capsule (180 mg total) by mouth daily. 90 capsule 3  . estradiol  (ESTRACE VAGINAL) 0.1 MG/GM vaginal cream Place 1 Applicatorful vaginally 2 (two) times a week. 42.5 g 3  . fenofibrate 160 MG tablet Take 160 mg by mouth every morning.    . lansoprazole (PREVACID) 30 MG capsule Take 30 mg by mouth daily at 12 noon.    . metaxalone (SKELAXIN) 800 MG tablet Take 400 mg by mouth daily as needed for muscle spasms.     . montelukast (SINGULAIR) 10 MG tablet Take 10 mg by mouth every morning.     . rosuvastatin (CRESTOR) 40 MG tablet Take 20 mg by mouth every evening.      Social History   Socioeconomic History  . Marital status: Divorced    Spouse name: Not on file  . Number of children: Not on file  . Years of education: Not on file  . Highest education level: Not on file  Occupational History  . Occupation: Full time   . Occupation: HR    Employer: [employer]  Tobacco Use  . Smoking status: Former Smoker    Types: Cigarettes    Quit date: 11/26/2010    Years since quitting: 8.9  . Smokeless tobacco: Never Used  . Tobacco comment: 5 ciggs per week  Vaping Use  . Vaping Use: Never used  Substance and Sexual Activity  . Alcohol use: No  . Drug use: No  . Sexual activity: Not Currently    Comment: 1st intercourse 64 YEARS OLD, Fewer than 5 PARTNERS,des neg  Other Topics Concern  . Not on file  Social History Narrative  . Not on file   Social Determinants of Health   Financial Resource Strain:   . Difficulty of Paying Living Expenses: Not on file  Food Insecurity:   . Worried About Charity fundraiser in the Last Year: Not on file  . Ran Out of Food in the Last Year: Not on file  Transportation Needs:   . Lack of Transportation (Medical): Not on file  . Lack of Transportation (Non-Medical): Not on file  Physical Activity:   . Days of Exercise per Week: Not on file  . Minutes of Exercise per Session: Not on file  Stress:   . Feeling of Stress : Not on file  Social Connections:   . Frequency of Communication with Friends and Family: Not on  file  . Frequency of Social Gatherings with Friends and Family: Not on file  . Attends Religious Services: Not on file  . Active Member of Clubs or Organizations: Not on file  . Attends Archivist Meetings: Not on file  . Marital Status: Not on file  Intimate Partner Violence:   . Fear of Current or Ex-Partner: Not on file  . Emotionally Abused: Not on file  . Physically Abused: Not on file  . Sexually Abused: Not on file   Family History  Problem Relation Age of Onset  . Hyperlipidemia Mother   . Hypertension Mother   . Breast cancer Mother 90  . Colon cancer Mother 8  . Kidney failure Mother   . Hyperlipidemia Father        History of coronary artery by  . Bladder Cancer Father   . Breast cancer Paternal Aunt        After age 32  . Breast cancer Paternal Aunt        After age 18     OBJECTIVE:  Vitals:   11/22/19 1354 11/22/19 1355  BP: (!) 150/80   Pulse: 72   Resp: 18   Temp: 98.3 F (36.8 C)   TempSrc: Oral   SpO2: 95%   Weight:  178 lb (80.7 kg)  Height:  5\' 6"  (1.676 m)    General appearance: Alert; NAD HEENT: NCAT.  Oropharynx clear.  Lungs: clear to auscultation bilaterally without adventitious breath sounds Heart: regular rate and rhythm.  Radial pulses 2+ symmetrical bilaterally Abdomen: soft, non-distended; normal active bowel sounds; non-tender to light and deep palpation; nontender at McBurney's point; negative Murphy's sign; negative rebound; no guarding Back: no CVA tenderness Extremities: no edema; symmetrical with no gross deformities Skin: warm and dry Neurologic: normal gait Psychological: alert and cooperative; normal mood and affect  LABS: No results found for this or any previous visit (from the past 24 hour(s)).  DIAGNOSTIC STUDIES: No results found.   ASSESSMENT & PLAN:  1. Diverticulitis of colon     Meds ordered this encounter  Medications  . ciprofloxacin (CIPRO) 500 MG tablet    Sig: Take 1 tablet (500 mg  total) by mouth every 12 (twelve) hours for 7 days.    Dispense:  14 tablet    Refill:  0  . metroNIDAZOLE (FLAGYL) 500 MG tablet    Sig: Take 1 tablet (500 mg total) by mouth 2 (two) times daily.    Dispense:  14 tablet    Refill:  0    Discharge instructions  Metronidazole and Cipro for Staxyn were prescribed Take medication as directed and to completion  Follow with PCP/GI for further reevaluation Return or go to ED for worsening of symptoms  If you experience new or worsening symptoms return or go to ER such as fever, chills, nausea, vomiting, diarrhea, bloody or dark tarry stools, constipation, urinary symptoms, worsening abdominal discomfort, symptoms that do not improve with medications, inability to keep fluids down, etc...  Reviewed expectations re: course of current medical issues. Questions answered. Outlined signs and symptoms indicating need for more acute intervention. Patient verbalized understanding. After Visit Summary given.   Note: This document was prepared using Dragon voice recognition software and may include unintentional dictation errors.    Emerson Monte, [place] 11/22/19 1417

## 2019-12-08 DIAGNOSIS — N761 Subacute and chronic vaginitis: Secondary | ICD-10-CM | POA: Diagnosis not present

## 2019-12-08 DIAGNOSIS — N952 Postmenopausal atrophic vaginitis: Secondary | ICD-10-CM | POA: Diagnosis not present

## 2019-12-08 DIAGNOSIS — R300 Dysuria: Secondary | ICD-10-CM | POA: Diagnosis not present

## 2019-12-08 DIAGNOSIS — N281 Cyst of kidney, acquired: Secondary | ICD-10-CM | POA: Diagnosis not present

## 2019-12-12 DIAGNOSIS — G4733 Obstructive sleep apnea (adult) (pediatric): Secondary | ICD-10-CM | POA: Diagnosis not present

## 2019-12-22 DIAGNOSIS — Z23 Encounter for immunization: Secondary | ICD-10-CM | POA: Diagnosis not present

## 2020-01-12 DIAGNOSIS — G4733 Obstructive sleep apnea (adult) (pediatric): Secondary | ICD-10-CM | POA: Diagnosis not present

## 2020-01-17 DIAGNOSIS — G4733 Obstructive sleep apnea (adult) (pediatric): Secondary | ICD-10-CM | POA: Diagnosis not present

## 2020-01-23 NOTE — Progress Notes (Unsigned)
Subjective   No chief complaint on file.    History of Present Illness:  Julie Gamble is a 64 y.o. female with HPI as follows:  New pt      Personal Skin Cancer History   Melanoma Unknown   Nonmelanoma skin cancer Unknown       Family Skin Cancer Hx     None        {Common SmartLinks:19316::"Patient's allergies, medications, past medical, surgical, family and social histories were reviewed and updated as appropriate."}    Review of Systems       Objective   Physical Examination:Physical Exam    Data Reviewed:  {[place] AMB DERM DATA REVIEWED:22452::" "}       Assessment   Assessment and Plan (numbered by problem):  ***    Minor Procedure Notes (if applicable):  {[place] AMB DERM MINOR PROC TIRWE:31540::" "}  Counseling:  Assessment and Plan were discussed.  {[place] AMB DERM COUNSELING:22451::" "}

## 2020-01-26 ENCOUNTER — Other Ambulatory Visit: Payer: Self-pay

## 2020-01-26 ENCOUNTER — Ambulatory Visit (AMBULATORY_SURGERY_CENTER): Payer: Self-pay | Admitting: *Deleted

## 2020-01-26 VITALS — Ht 66.0 in | Wt 185.0 lb

## 2020-01-26 DIAGNOSIS — Z800 Family history of malignant neoplasm of digestive organs: Secondary | ICD-10-CM

## 2020-01-26 NOTE — Progress Notes (Signed)
Fully vaccinated  No egg or soy allergy known to patient  No issues with past sedation with any surgeries or procedures no intubation problems in the past  No FH of Malignant Hyperthermia No diet pills per patient No home 02 use per patient  No blood thinners per patient  Pt denies issues with constipation  No A fib or A flutter  EMMI video to pt or via [place] 19 guidelines implemented in PV today with Pt and RN   Diverticulitis last 11-12-2019 per pt- treated and currently no s/s    Due to the COVID-19 pandemic we are asking patients to follow these guidelines. Please only bring one care partner. Please be aware that your care partner may wait in the car in the parking lot or if they feel like they will be too hot to wait in the car, they may wait in the lobby on the 4th floor. All care partners are required to wear a mask the entire time (we do not have any that we can provide them), they need to practice social distancing, and we will do a Covid check for all patient's and care partners when you arrive. Also we will check their temperature and your temperature. If the care partner waits in their car they need to stay in the parking lot the entire time and we will call them on their cell phone when the patient is ready for discharge so they can bring the car to the front of the building. Also all patient's will need to wear a mask into building.

## 2020-01-27 ENCOUNTER — Encounter: Payer: Self-pay | Admitting: Gastroenterology

## 2020-02-03 DIAGNOSIS — H40013 Open angle with borderline findings, low risk, bilateral: Secondary | ICD-10-CM | POA: Diagnosis not present

## 2020-02-03 DIAGNOSIS — Z6829 Body mass index (BMI) 29.0-29.9, adult: Secondary | ICD-10-CM | POA: Diagnosis not present

## 2020-02-03 DIAGNOSIS — E663 Overweight: Secondary | ICD-10-CM | POA: Diagnosis not present

## 2020-02-03 DIAGNOSIS — M7711 Lateral epicondylitis, right elbow: Secondary | ICD-10-CM | POA: Diagnosis not present

## 2020-02-09 ENCOUNTER — Ambulatory Visit (AMBULATORY_SURGERY_CENTER): Payer: BC Managed Care – PPO | Admitting: Gastroenterology

## 2020-02-09 ENCOUNTER — Other Ambulatory Visit: Payer: Self-pay

## 2020-02-09 ENCOUNTER — Encounter: Payer: Self-pay | Admitting: Gastroenterology

## 2020-02-09 VITALS — BP 132/79 | HR 67 | Temp 98.0°F | Resp 17 | Ht 66.0 in | Wt 185.0 lb

## 2020-02-09 DIAGNOSIS — Z86010 Personal history of colonic polyps: Secondary | ICD-10-CM | POA: Diagnosis not present

## 2020-02-09 DIAGNOSIS — D124 Benign neoplasm of descending colon: Secondary | ICD-10-CM | POA: Diagnosis not present

## 2020-02-09 DIAGNOSIS — Z8371 Family history of colonic polyps: Secondary | ICD-10-CM | POA: Diagnosis not present

## 2020-02-09 DIAGNOSIS — Z800 Family history of malignant neoplasm of digestive organs: Secondary | ICD-10-CM

## 2020-02-09 DIAGNOSIS — Z1211 Encounter for screening for malignant neoplasm of colon: Secondary | ICD-10-CM | POA: Diagnosis not present

## 2020-02-09 MED ORDER — SODIUM CHLORIDE 0.9 % IV SOLN: 500.0000 mL | Freq: Once | INTRAVENOUS | Status: AC

## 2020-02-09 NOTE — Progress Notes (Signed)
[facility] - Check-in  [place] - VS  Pt's states no medical or surgical changes since previsit or office visit.

## 2020-02-09 NOTE — Progress Notes (Signed)
Report given to PACU, vss 

## 2020-02-09 NOTE — Progress Notes (Signed)
Called to room to assist during endoscopic procedure.  Patient ID and intended procedure confirmed with present staff. Received instructions for my participation in the procedure from the performing physician.  

## 2020-02-09 NOTE — Op Note (Signed)
[place] Patient Name: Shelley [name] Procedure Date: 02/09/2020 1:10 PM MRN: [id number] Endoscopist: Thornton Park MD, MD Age: 64 Referring MD:  Date of Birth: 18-Jan-1956 Gender: Female Account #: [id number] Procedure:                Colonoscopy Indications:              Screening patient at increased risk: Family history                            of 1st-degree relative with colorectal cancer at                            age 36 years (or older)                           Normal colonoscopy 2007 and 2016                           Mother with colon cancer at age 80                           Father with colon polyps Medicines:                Monitored Anesthesia Care Procedure:                Pre-Anesthesia Assessment:                           - Prior to the procedure, a History and Physical                            was performed, and patient medications and                            allergies were reviewed. The patient's tolerance of                            previous anesthesia was also reviewed. The risks                            and benefits of the procedure and the sedation                            options and risks were discussed with the patient.                            All questions were answered, and informed consent                            was obtained. Prior Anticoagulants: The patient has                            taken no previous anticoagulant or antiplatelet  agents. ASA Grade Assessment: III - A patient with                            severe systemic disease. After reviewing the risks                            and benefits, the patient was deemed in                            satisfactory condition to undergo the procedure.                           After obtaining informed consent, the colonoscope                            was passed under direct vision. Throughout the                            procedure,  the patient's blood pressure, pulse, and                            oxygen saturations were monitored continuously. The                            Colonoscope was introduced through the anus and                            advanced to the the cecum, identified by                            appendiceal orifice and ileocecal valve. The                            colonoscopy was performed without difficulty. The                            patient tolerated the procedure well. The quality                            of the bowel preparation was good after lavage of                            800cc of brown liquid. The ileocecal valve,                            appendiceal orifice, and rectum were photographed. Scope In: 1:36:16 PM Scope Out: 1:53:21 PM Scope Withdrawal Time: 0 hours 13 minutes 21 seconds  Total Procedure Duration: 0 hours 17 minutes 5 seconds  Findings:                 The perianal and digital rectal examinations were                            normal.  Multiple small and large-mouthed diverticula were                            found in the sigmoid colon.                           A 1 mm polyp was found in the descending colon. The                            polyp was flat. The polyp was removed with a cold                            biopsy forceps. Resection and retrieval were                            complete. Estimated blood loss was minimal.                           UThe exam was otherwise without abnormality on                            direct and retroflexion views except for internal                            hemorrhoids. Complications:            No immediate complications. Estimated blood loss:                            Minimal. Estimated Blood Loss:     Estimated blood loss was minimal. Impression:               - Diverticulosis in the sigmoid colon.                           - Internal hemorrhoids.                           - One 1  mm polyp in the descending colon, removed                            with a cold biopsy forceps. Resected and retrieved.                           - The examination was otherwise normal on direct                            and retroflexion views. Recommendation:           - Patient has a contact number available for                            emergencies. The signs and symptoms of potential                            delayed complications were  discussed with the                            patient. Return to normal activities tomorrow.                            Written discharge instructions were provided to the                            patient.                           - Follow a high fiber diet. Drink at least 64                            ounces of water daily. Add a daily stool bulking                            agent such as psyllium (an exampled would be                            Metamucil).                           - Continue present medications.                           - Await pathology results.                           - Repeat colonoscopy in 5 years for surveillance                            given the family history.                           - Consider two day prep in the future given the                            volume of brown liquid removed during this                            procedure.                           - Emerging evidence supports eating a diet of                            fruits, vegetables, grains, calcium, and yogurt                            while reducing red meat and alcohol may reduce the                            risk of colon cancer.                           -  Thank you for allowing me to be involved in your                            colon cancer prevention. Thornton Park MD, MD 02/09/2020 2:01:53 PM This report has been signed electronically.

## 2020-02-09 NOTE — Patient Instructions (Signed)
Handouts provided on polyps, diverticulosis and high-fiber diet.   Follow a high fiber diet. Drink at least 64 ounces of water daily. Add a daily stool bulking agent such as psyllium (an example would be Metamucil).   Repeat colonoscopy in 5 years for surveillance given the family history.   YOU HAD AN ENDOSCOPIC PROCEDURE TODAY AT [facility]:   Refer to the procedure report that was given to you for any specific questions about what was found during the examination.  If the procedure report does not answer your questions, please call your gastroenterologist to clarify.  If you requested that your care partner not be given the details of your procedure findings, then the procedure report has been included in a sealed envelope for you to review at your convenience later.  YOU SHOULD EXPECT: Some feelings of bloating in the abdomen. Passage of more gas than usual.  Walking can help get rid of the air that was put into your GI tract during the procedure and reduce the bloating. If you had a lower endoscopy (such as a colonoscopy or flexible sigmoidoscopy) you may notice spotting of blood in your stool or on the toilet paper. If you underwent a bowel prep for your procedure, you may not have a normal bowel movement for a few days.  Please Note:  You might notice some irritation and congestion in your nose or some drainage.  This is from the oxygen used during your procedure.  There is no need for concern and it should clear up in a day or so.  SYMPTOMS TO REPORT IMMEDIATELY:   Following lower endoscopy (colonoscopy or flexible sigmoidoscopy):  Excessive amounts of blood in the stool  Significant tenderness or worsening of abdominal pains  Swelling of the abdomen that is new, acute  Fever of 100F or higher  For urgent or emergent issues, a gastroenterologist can be reached at any hour by calling [phone]. Do not use MyChart messaging for urgent concerns.    DIET:  We do  recommend a small meal at first, but then you may proceed to your regular diet.  Drink plenty of fluids but you should avoid alcoholic beverages for 24 hours.  ACTIVITY:  You should plan to take it easy for the rest of today and you should NOT DRIVE or use heavy machinery until tomorrow (because of the sedation medicines used during the test).    FOLLOW UP: Our staff will call the number listed on your records 48-72 hours following your procedure to check on you and address any questions or concerns that you may have regarding the information given to you following your procedure. If we do not reach you, we will leave a message.  We will attempt to reach you two times.  During this call, we will ask if you have developed any symptoms of COVID 19. If you develop any symptoms (ie: fever, flu-like symptoms, shortness of breath, cough etc.) before then, please call [phone].  If you test positive for Covid 19 in the 2 weeks post procedure, please call and report this information to [place].    If any biopsies were taken you will be contacted by phone or by letter within the next 1-3 weeks.  Please call us at [phone] if you have not heard about the biopsies in 3 weeks.    SIGNATURES/CONFIDENTIALITY: You and/or your care partner have signed paperwork which will be entered into your electronic medical record.  These signatures attest to the  fact that that the information above on your After Visit Summary has been reviewed and is understood.  Full responsibility of the confidentiality of this discharge information lies with you and/or your care-partner.

## 2020-02-11 ENCOUNTER — Telehealth: Payer: Self-pay

## 2020-02-11 DIAGNOSIS — G4733 Obstructive sleep apnea (adult) (pediatric): Secondary | ICD-10-CM | POA: Diagnosis not present

## 2020-02-11 NOTE — Telephone Encounter (Signed)
  Follow up Call-  Call back number 02/09/2020  Post procedure Call Back phone  # [phone] cell  Permission to leave phone message Yes  Some recent data might be hidden     Patient questions:  Do you have a fever, pain , or abdominal swelling? No. Pain Score  0 *  Have you tolerated food without any problems? Yes.    Have you been able to return to your normal activities? Yes.    Do you have any questions about your discharge instructions: Diet   No. Medications  No. Follow up visit  No.  Do you have questions or concerns about your Care? No.  Actions: * If pain score is 4 or above: No action needed, pain <4.  1. Have you developed a fever since your procedure? No  2.   Have you had an respiratory symptoms (SOB or cough) since your procedure? No 3.   Have you tested positive for COVID 19 since your procedure No  4.   Have you had any family members/close contacts diagnosed with the COVID 19 since your procedure?  No   If yes to any of these questions please route to Joylene John, RN and Joella Prince, RN

## 2020-02-17 ENCOUNTER — Encounter: Payer: Self-pay | Admitting: Gastroenterology

## 2020-03-01 DIAGNOSIS — N761 Subacute and chronic vaginitis: Secondary | ICD-10-CM | POA: Diagnosis not present

## 2020-03-01 DIAGNOSIS — R300 Dysuria: Secondary | ICD-10-CM | POA: Diagnosis not present

## 2020-03-01 DIAGNOSIS — N952 Postmenopausal atrophic vaginitis: Secondary | ICD-10-CM | POA: Diagnosis not present

## 2020-03-01 DIAGNOSIS — N281 Cyst of kidney, acquired: Secondary | ICD-10-CM | POA: Diagnosis not present

## 2020-03-01 DIAGNOSIS — R3915 Urgency of urination: Secondary | ICD-10-CM | POA: Diagnosis not present

## 2020-03-13 DIAGNOSIS — G4733 Obstructive sleep apnea (adult) (pediatric): Secondary | ICD-10-CM | POA: Diagnosis not present

## 2020-04-13 DIAGNOSIS — G4733 Obstructive sleep apnea (adult) (pediatric): Secondary | ICD-10-CM | POA: Diagnosis not present

## 2020-05-11 DIAGNOSIS — G4733 Obstructive sleep apnea (adult) (pediatric): Secondary | ICD-10-CM | POA: Diagnosis not present

## 2020-06-11 DIAGNOSIS — G4733 Obstructive sleep apnea (adult) (pediatric): Secondary | ICD-10-CM | POA: Diagnosis not present

## 2020-07-06 DIAGNOSIS — H2513 Age-related nuclear cataract, bilateral: Secondary | ICD-10-CM | POA: Diagnosis not present

## 2020-07-06 DIAGNOSIS — H25013 Cortical age-related cataract, bilateral: Secondary | ICD-10-CM | POA: Diagnosis not present

## 2020-07-06 DIAGNOSIS — H2511 Age-related nuclear cataract, right eye: Secondary | ICD-10-CM | POA: Diagnosis not present

## 2020-07-06 DIAGNOSIS — H40053 Ocular hypertension, bilateral: Secondary | ICD-10-CM | POA: Diagnosis not present

## 2020-07-06 DIAGNOSIS — H25043 Posterior subcapsular polar age-related cataract, bilateral: Secondary | ICD-10-CM | POA: Diagnosis not present

## 2020-07-11 DIAGNOSIS — G4733 Obstructive sleep apnea (adult) (pediatric): Secondary | ICD-10-CM | POA: Diagnosis not present

## 2020-08-11 DIAGNOSIS — G4733 Obstructive sleep apnea (adult) (pediatric): Secondary | ICD-10-CM | POA: Diagnosis not present

## 2020-08-13 DIAGNOSIS — H2512 Age-related nuclear cataract, left eye: Secondary | ICD-10-CM | POA: Diagnosis not present

## 2020-08-13 DIAGNOSIS — H2511 Age-related nuclear cataract, right eye: Secondary | ICD-10-CM | POA: Diagnosis not present

## 2020-08-27 DIAGNOSIS — H2512 Age-related nuclear cataract, left eye: Secondary | ICD-10-CM | POA: Diagnosis not present

## 2020-09-14 ENCOUNTER — Ambulatory Visit: Payer: BC Managed Care – PPO | Admitting: Nurse Practitioner

## 2020-09-23 DIAGNOSIS — Z1231 Encounter for screening mammogram for malignant neoplasm of breast: Secondary | ICD-10-CM | POA: Diagnosis not present

## 2020-10-08 DIAGNOSIS — Z681 Body mass index (BMI) 19 or less, adult: Secondary | ICD-10-CM | POA: Diagnosis not present

## 2020-10-08 DIAGNOSIS — K120 Recurrent oral aphthae: Secondary | ICD-10-CM | POA: Diagnosis not present

## 2020-10-19 ENCOUNTER — Telehealth: Payer: Self-pay | Admitting: *Deleted

## 2020-10-19 DIAGNOSIS — N951 Menopausal and female climacteric states: Secondary | ICD-10-CM

## 2020-10-19 MED ORDER — ESTRADIOL 0.1 MG/GM VA CREA: TOPICAL_CREAM | VAGINAL | 0 refills | Status: DC

## 2020-10-19 NOTE — Telephone Encounter (Signed)
Patient called requesting refill on estrace 0.1 mg cream, annual exam scheduled on 11/16/20.

## 2020-10-27 ENCOUNTER — Ambulatory Visit: Payer: BC Managed Care – PPO | Admitting: Nurse Practitioner

## 2020-11-01 DIAGNOSIS — K120 Recurrent oral aphthae: Secondary | ICD-10-CM | POA: Diagnosis not present

## 2020-11-11 DIAGNOSIS — I10 Essential (primary) hypertension: Secondary | ICD-10-CM | POA: Diagnosis not present

## 2020-11-11 DIAGNOSIS — U071 COVID-19: Secondary | ICD-10-CM | POA: Diagnosis not present

## 2020-11-16 ENCOUNTER — Other Ambulatory Visit: Payer: Self-pay

## 2020-11-16 ENCOUNTER — Ambulatory Visit (INDEPENDENT_AMBULATORY_CARE_PROVIDER_SITE_OTHER): Payer: BC Managed Care – PPO | Admitting: Nurse Practitioner

## 2020-11-16 ENCOUNTER — Encounter: Payer: Self-pay | Admitting: Nurse Practitioner

## 2020-11-16 VITALS — BP 124/82 | Ht 65.0 in | Wt 189.0 lb

## 2020-11-16 DIAGNOSIS — N951 Menopausal and female climacteric states: Secondary | ICD-10-CM | POA: Diagnosis not present

## 2020-11-16 DIAGNOSIS — Z01419 Encounter for gynecological examination (general) (routine) without abnormal findings: Secondary | ICD-10-CM | POA: Diagnosis not present

## 2020-11-16 DIAGNOSIS — Z780 Asymptomatic menopausal state: Secondary | ICD-10-CM

## 2020-11-16 MED ORDER — ESTRADIOL 0.1 MG/GM VA CREA: TOPICAL_CREAM | VAGINAL | 4 refills | Status: DC

## 2020-11-16 NOTE — Progress Notes (Signed)
   Shelley [name] Jan 21, 1956 [id number]   History:  65 y.o. G2 P1 presents for annual exam. Postmenopausal, S/P 2003 supracervical TAH/BSO for fibroids and endometriosis.  Using vaginal estrogen for dryness twice weekly. Normal pap and mammogram history. Primary care manages hypertension, GERD, HLD.  Gets bone density done at work, we do not have these records but patient reports normal.    Gynecologic History No LMP recorded. Patient has had a hysterectomy.   Contraception: status post hysterectomy  Health Maintenace Last Pap: 08/28/2018. Results were: normal, 5-year repeat Last mammogram: 09/23/2020. Results were: normal Last colonoscopy: 02/09/2020. Results were: benign polyp x 1, 5-year repeat Last Dexa: 2021. Results were: normal per pt  Past medical history, past surgical history, family history and social history were all reviewed and documented in the EPIC chart. Daughter working on PhD. Mother with history of breast cancer at age 40 and colon cancer at age 50.  ROS:  A ROS was performed and pertinent positives and negatives are included.  Exam:  Vitals:   11/16/20 1123  BP: 124/82  Weight: 189 lb (85.7 kg)  Height: '5\' 5"'$  (1.651 m)    Body mass index is 31.45 kg/m.  General appearance:  Normal Thyroid:  Symmetrical, normal in size, without palpable masses or nodularity. Respiratory  Auscultation:  Clear without wheezing or rhonchi Cardiovascular  Auscultation:  Regular rate, without rubs, murmurs or gallops  Edema/varicosities:  Not grossly evident Abdominal  Soft,nontender, without masses, guarding or rebound.  Liver/spleen:  No organomegaly noted  Hernia:  None appreciated  Skin  Inspection:  Grossly normal   Breasts: Examined lying and sitting.   Right: Without masses, retractions, discharge or axillary adenopathy.   Left: Without masses, retractions, discharge or axillary adenopathy. Gentitourinary   Inguinal/mons:  Normal without inguinal  adenopathy  External genitalia:  Normal  BUS/Urethra/Skene's glands:  Normal  Vagina:  Atrophic changes  Cervix:  Normal  Uterus:  Absent  Adnexa/parametria:     Rt: Without masses or tenderness.   Lt: Without masses or tenderness.  Anus and perineum: Normal  Digital rectal exam: Normal sphincter tone without palpated masses or tenderness  Patient informed chaperone available to be present for breast and pelvic exam. Patient has requested no chaperone to be present. Patient has been advised what will be completed during breast and pelvic exam.   Assessment/Plan:  65 y.o. G2 P1 for annual exam.    Well female exam with routine gynecological exam - Education provided on SBEs, importance of preventative screenings, current guidelines, high calcium diet, regular exercise, and multivitamin daily. Labs with PCP.   Menopausal vaginal dryness - Plan: estradiol (ESTRACE VAGINAL) 0.1 MG/GM vaginal cream twice weekly. She is aware of slight risk for systemic absorption. She only applies externally. Refill x 1 year provided.   Postmenopausal - S/P 2003 supracervical TAH/BSO for fibroids and endometriosis.   Screening for cervical cancer - Normal Pap history. Will repeat at 5-year interval per guidelines.  Screening for breast cancer - Normal mammogram history.  Continue annual screenings.  Normal breast exam today. Mother with  history of breast cancer.   Screening for colon cancer - 2021 colonoscopy. Will repeat at GI's recommended interval. Mother with history of colon cancer.   Screening for osteoporosis - annual bone density testing provided through work. Normal in 2021 per patient.    Follow up in 1 year for annual      [place], 11:51 AM 11/16/2020

## 2020-11-17 DIAGNOSIS — G4733 Obstructive sleep apnea (adult) (pediatric): Secondary | ICD-10-CM | POA: Diagnosis not present

## 2020-11-17 DIAGNOSIS — I10 Essential (primary) hypertension: Secondary | ICD-10-CM | POA: Diagnosis not present

## 2020-12-09 DIAGNOSIS — K120 Recurrent oral aphthae: Secondary | ICD-10-CM | POA: Diagnosis not present

## 2020-12-20 DIAGNOSIS — I10 Essential (primary) hypertension: Secondary | ICD-10-CM | POA: Diagnosis not present

## 2020-12-20 DIAGNOSIS — G4733 Obstructive sleep apnea (adult) (pediatric): Secondary | ICD-10-CM | POA: Diagnosis not present

## 2020-12-22 DIAGNOSIS — Z23 Encounter for immunization: Secondary | ICD-10-CM | POA: Diagnosis not present

## 2021-01-06 DIAGNOSIS — G4733 Obstructive sleep apnea (adult) (pediatric): Secondary | ICD-10-CM | POA: Diagnosis not present

## 2021-01-28 DIAGNOSIS — H40053 Ocular hypertension, bilateral: Secondary | ICD-10-CM | POA: Diagnosis not present

## 2021-01-31 DIAGNOSIS — L308 Other specified dermatitis: Secondary | ICD-10-CM | POA: Diagnosis not present

## 2021-01-31 DIAGNOSIS — D485 Neoplasm of uncertain behavior of skin: Secondary | ICD-10-CM | POA: Diagnosis not present

## 2021-02-07 DIAGNOSIS — I10 Essential (primary) hypertension: Secondary | ICD-10-CM | POA: Diagnosis not present

## 2021-02-07 DIAGNOSIS — G4733 Obstructive sleep apnea (adult) (pediatric): Secondary | ICD-10-CM | POA: Diagnosis not present

## 2021-03-10 ENCOUNTER — Ambulatory Visit: Payer: BC Managed Care – PPO

## 2021-03-10 ENCOUNTER — Encounter: Payer: Self-pay | Admitting: Orthopedic Surgery

## 2021-03-10 ENCOUNTER — Ambulatory Visit: Payer: BC Managed Care – PPO | Admitting: Orthopedic Surgery

## 2021-03-10 ENCOUNTER — Other Ambulatory Visit: Payer: Self-pay

## 2021-03-10 VITALS — BP 171/72 | HR 81 | Ht 66.0 in | Wt 192.0 lb

## 2021-03-10 DIAGNOSIS — M25511 Pain in right shoulder: Secondary | ICD-10-CM

## 2021-03-10 DIAGNOSIS — M7541 Impingement syndrome of right shoulder: Secondary | ICD-10-CM

## 2021-03-10 NOTE — Progress Notes (Signed)
Chief Complaint  Patient presents with   New Patient (Initial Visit)   Shoulder Pain    RT/hurting x 6 weeks    HPI: 65 year old female pain for 6 weeks right shoulder.  Pain seems to be anterior with occasional pain radiating from the scapula towards the right shoulder.  She did take some ibuprofen denies any trauma  Past Medical History:  Diagnosis Date   Allergy    Arthritis    Cataract    bilat   COPD (chronic obstructive pulmonary disease) ([place])    very mild vs mild asthma    Diverticulitis    GERD (gastroesophageal reflux disease)    Hyperlipidemia    Hypertension    Palpitations    Polycystic kidney disease    Sleep apnea    wears cpap     BP (!) 171/72    Pulse 81    Ht 5\' 6"  (1.676 m)    Wt 192 lb (87.1 kg)    BMI 30.99 kg/m    General appearance: Well-developed well-nourished no gross deformities  Cardiovascular normal pulse and perfusion normal color without edema  Neurologically no sensation loss or deficits or pathologic reflexes  Psychological: Awake alert and oriented x3 mood and affect normal  Skin no lacerations or ulcerations no nodularity no palpable masses, no erythema or nodularity  Musculoskeletal: Right shoulder full range of motion pain with rotation, she does note some tenderness anteriorly cuff strength is 5 out of 5 impingement sign is weakly positive  Imaging x-rays are reviewed I interpret these as a normal film  A/P  Encounter Diagnoses  Name Primary?   Acute pain of right shoulder Yes   Shoulder impingement, right     We decided to continue with the Tylenol ibuprofen routine ice heat and/or exercises as needed she did not want to do steroids as she had a bad reaction to them at 1 time  Follow-up as needed

## 2021-03-11 ENCOUNTER — Telehealth: Payer: Self-pay | Admitting: Radiology

## 2021-03-11 ENCOUNTER — Other Ambulatory Visit: Payer: Self-pay | Admitting: Orthopedic Surgery

## 2021-03-11 DIAGNOSIS — I10 Essential (primary) hypertension: Secondary | ICD-10-CM | POA: Diagnosis not present

## 2021-03-11 DIAGNOSIS — G4733 Obstructive sleep apnea (adult) (pediatric): Secondary | ICD-10-CM | POA: Diagnosis not present

## 2021-03-11 MED ORDER — MELOXICAM 7.5 MG PO TABS: 7.5000 mg | ORAL_TABLET | Freq: Every day | ORAL | 5 refills | Status: DC

## 2021-03-11 NOTE — Progress Notes (Signed)
Meds ordered this encounter  Medications   meloxicam (MOBIC) 7.5 MG tablet    Sig: Take 1 tablet (7.5 mg total) by mouth daily.    Dispense:  30 tablet    Refill:  5    

## 2021-03-11 NOTE — Telephone Encounter (Signed)
Patient called, she says the anti inflammatory you were going to send in was not sent.  Can you please send?   [employer].

## 2021-03-15 ENCOUNTER — Telehealth: Payer: Self-pay

## 2021-03-15 NOTE — Telephone Encounter (Signed)
FYI: Patient had called letting us know that the script you sent in was not at [facility] when she last checked on Saturday. She stated that she got notification from [facility] that they received it and she should get it between 1/8 and 1/14. It shows it was received by [employer] in the computer, so I called them and was told that it showed them that [facility] had filled it. Patient is going to call back in a couple of days to let us know if she has received it thru the mail, if not she stated maybe you could send in to [facility] and she could get it with out using her insurance.

## 2021-05-09 DIAGNOSIS — H04123 Dry eye syndrome of bilateral lacrimal glands: Secondary | ICD-10-CM | POA: Diagnosis not present

## 2021-06-03 DIAGNOSIS — H40053 Ocular hypertension, bilateral: Secondary | ICD-10-CM | POA: Diagnosis not present

## 2021-11-23 ENCOUNTER — Encounter: Payer: Self-pay | Admitting: Nurse Practitioner

## 2021-11-23 ENCOUNTER — Ambulatory Visit (INDEPENDENT_AMBULATORY_CARE_PROVIDER_SITE_OTHER): Payer: BC Managed Care – PPO | Admitting: Nurse Practitioner

## 2021-11-23 VITALS — BP 122/78 | HR 74 | Ht 64.5 in | Wt 169.0 lb

## 2021-11-23 DIAGNOSIS — N951 Menopausal and female climacteric states: Secondary | ICD-10-CM

## 2021-11-23 DIAGNOSIS — Z01419 Encounter for gynecological examination (general) (routine) without abnormal findings: Secondary | ICD-10-CM | POA: Diagnosis not present

## 2021-11-23 DIAGNOSIS — Z1231 Encounter for screening mammogram for malignant neoplasm of breast: Secondary | ICD-10-CM | POA: Diagnosis not present

## 2021-11-23 DIAGNOSIS — Z780 Asymptomatic menopausal state: Secondary | ICD-10-CM

## 2021-11-23 MED ORDER — ESTRADIOL 0.1 MG/GM VA CREA: TOPICAL_CREAM | VAGINAL | 2 refills | Status: DC

## 2021-11-23 NOTE — Progress Notes (Signed)
Shelley [name] [birth year] [id number]   History:  66 y.o. G2 P1 presents for annual exam. Postmenopausal - no HRT. S/P 2003 supracervical hysterectomy with BSO for fibroids and endometriosis.  Using vaginal estrogen externally for dryness/atrophy twice weekly. Normal pap and mammogram history. Primary care manages hypertension, GERD, HLD.    Gynecologic History No LMP recorded. Patient has had a hysterectomy.   Contraception: status post hysterectomy Sexually active: No  Health Maintenace Last Pap: 08/28/2018. Results were: Normal neg HPV, 5-year repeat Last mammogram: 11/23/2021 (today). Results were: No report today Last colonoscopy: 02/09/2020. Results were: Tubular adenoma, 5-year recall Last Dexa: 2021 per patient at work. Results were: Normal  Past medical history, past surgical history, family history and social history were all reviewed and documented in the EPIC chart. 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 Mother with history of breast cancer at age 64 and colon cancer at age 31.  ROS:  A ROS was performed and pertinent positives and negatives are included.  Exam:  Vitals:   11/23/21 0951  BP: 122/78  Pulse: 74  SpO2: 96%  Weight: 169 lb (76.7 kg)  Height: 5' 4.5" (1.638 m)     Body mass index is 28.56 kg/m.  General appearance:  Normal Thyroid:  Symmetrical, normal in size, without palpable masses or nodularity. Respiratory  Auscultation:  Clear without wheezing or rhonchi Cardiovascular  Auscultation:  Regular rate, without rubs, murmurs or gallops  Edema/varicosities:  Not grossly evident Abdominal  Soft,nontender, without masses, guarding or rebound.  Liver/spleen:  No organomegaly noted  Hernia:  None appreciated  Skin  Inspection:  Grossly normal   Breasts: Examined lying and sitting.   Right: Without masses, retractions, discharge or axillary adenopathy.   Left: Without masses, retractions, discharge or axillary  adenopathy. Genitourinary   Inguinal/mons:  Normal without inguinal adenopathy  External genitalia:  Normal appearing vulva with no masses, tenderness, or lesions  BUS/Urethra/Skene's glands:  Normal  Vagina:  Normal appearing with normal color and discharge, no lesions. Atrophic  Cervix:  Normal appearing without discharge or lesions. Atrophic  Uterus:  Absent  Adnexa/parametria:     Rt: Normal in size, without masses or tenderness.   Lt: Normal in size, without masses or tenderness.  Anus and perineum: Normal  Digital rectal exam: Normal sphincter tone without palpated masses or tenderness  Patient informed chaperone available to be present for breast and pelvic exam. Patient has requested no chaperone to be present. Patient has been advised what will be completed during breast and pelvic exam.   Assessment/Plan:  66 y.o. G2 P1 for annual exam.    Well female exam with routine gynecological exam - Education provided on SBEs, importance of preventative screenings, current guidelines, high calcium diet, regular exercise, and multivitamin daily. Labs through work.   Menopausal vaginal dryness - Plan: estradiol (ESTRACE VAGINAL) 0.1 MG/GM vaginal cream externally twice weekly. She is aware of slight risk for systemic absorption. She only applies externally. Refill x 1 year provided.   Postmenopausal - Plan: DG Bone Density. Reports normal DXA at work. Unsure if they are screening spine and hips. Will check and if not will schedule here. S/P 2003 supracervical TAH/BSO for fibroids and endometriosis. Denies menopausal symptoms.   Screening for cervical cancer - Normal Pap history. Will repeat at 5-year interval per guidelines.  Screening for breast cancer - Normal mammogram history.  Continue annual screenings.  Normal breast exam today. Mother with history of breast cancer.  Screening for colon cancer - 2021 colonoscopy. Will repeat at GI's recommended interval. Mother with history of colon  cancer.   Screening for osteoporosis - Reports that she gets annual bone density at work. Normal per patient.   Follow up in 1 year for annual.      Tamela Gammon [facility], 10:25 AM 11/23/2021

## 2021-12-15 DIAGNOSIS — L408 Other psoriasis: Secondary | ICD-10-CM | POA: Diagnosis not present

## 2021-12-15 DIAGNOSIS — L853 Xerosis cutis: Secondary | ICD-10-CM | POA: Diagnosis not present

## 2021-12-15 DIAGNOSIS — I10 Essential (primary) hypertension: Secondary | ICD-10-CM | POA: Diagnosis not present

## 2021-12-15 DIAGNOSIS — H4010X1 Unspecified open-angle glaucoma, mild stage: Secondary | ICD-10-CM | POA: Diagnosis not present

## 2021-12-15 DIAGNOSIS — Z6830 Body mass index (BMI) 30.0-30.9, adult: Secondary | ICD-10-CM | POA: Diagnosis not present

## 2021-12-20 DIAGNOSIS — L408 Other psoriasis: Secondary | ICD-10-CM | POA: Diagnosis not present

## 2021-12-20 DIAGNOSIS — H4010X1 Unspecified open-angle glaucoma, mild stage: Secondary | ICD-10-CM | POA: Diagnosis not present

## 2021-12-20 DIAGNOSIS — L853 Xerosis cutis: Secondary | ICD-10-CM | POA: Diagnosis not present

## 2021-12-26 DIAGNOSIS — Z23 Encounter for immunization: Secondary | ICD-10-CM | POA: Diagnosis not present

## 2022-01-06 DIAGNOSIS — H40013 Open angle with borderline findings, low risk, bilateral: Secondary | ICD-10-CM | POA: Diagnosis not present

## 2022-01-09 DIAGNOSIS — R21 Rash and other nonspecific skin eruption: Secondary | ICD-10-CM | POA: Diagnosis not present

## 2022-01-09 DIAGNOSIS — R768 Other specified abnormal immunological findings in serum: Secondary | ICD-10-CM | POA: Diagnosis not present

## 2022-01-31 ENCOUNTER — Other Ambulatory Visit: Payer: Self-pay | Admitting: Nurse Practitioner

## 2022-01-31 ENCOUNTER — Ambulatory Visit (INDEPENDENT_AMBULATORY_CARE_PROVIDER_SITE_OTHER): Payer: BC Managed Care – PPO

## 2022-01-31 DIAGNOSIS — M8589 Other specified disorders of bone density and structure, multiple sites: Secondary | ICD-10-CM

## 2022-01-31 DIAGNOSIS — Z01419 Encounter for gynecological examination (general) (routine) without abnormal findings: Secondary | ICD-10-CM

## 2022-01-31 DIAGNOSIS — Z780 Asymptomatic menopausal state: Secondary | ICD-10-CM

## 2022-01-31 DIAGNOSIS — N951 Menopausal and female climacteric states: Secondary | ICD-10-CM

## 2022-01-31 DIAGNOSIS — Z13820 Encounter for screening for osteoporosis: Secondary | ICD-10-CM | POA: Diagnosis not present

## 2022-03-03 ENCOUNTER — Encounter: Payer: Self-pay | Admitting: Nurse Practitioner

## 2022-03-03 ENCOUNTER — Ambulatory Visit: Payer: BC Managed Care – PPO | Attending: Nurse Practitioner | Admitting: Nurse Practitioner

## 2022-03-03 VITALS — BP 146/86 | HR 74 | Ht 66.0 in | Wt 171.0 lb

## 2022-03-03 DIAGNOSIS — E781 Pure hyperglyceridemia: Secondary | ICD-10-CM

## 2022-03-03 DIAGNOSIS — R002 Palpitations: Secondary | ICD-10-CM

## 2022-03-03 DIAGNOSIS — E782 Mixed hyperlipidemia: Secondary | ICD-10-CM | POA: Diagnosis not present

## 2022-03-03 DIAGNOSIS — I10 Essential (primary) hypertension: Secondary | ICD-10-CM

## 2022-03-03 MED ORDER — DILTIAZEM HCL ER COATED BEADS 360 MG PO CP24: 360.0000 mg | ORAL_CAPSULE | Freq: Every day | ORAL | 3 refills | Status: AC

## 2022-03-03 NOTE — Patient Instructions (Signed)
Medication Instructions:  Your physician has recommended you make the following change in your medication:  Increase Diltiazem to 360 mg tablets once daily   Labwork: None  Testing/Procedures: None  Follow-Up: Follow up with Murray Hodgkins, NP in 3-4 weeks.   Any Other Special Instructions Will Be Listed Below (If Applicable).     If you need a refill on your cardiac medications before your next appointment, please call your pharmacy.

## 2022-03-03 NOTE — Progress Notes (Signed)
Office Visit    Patient Name: Shelley [name] Date of Encounter: 03/03/2022  Primary Care Provider:  Jake Samples, PA-C Primary Cardiologist:  Rozann Lesches, MD  Chief Complaint    66 year old female with a history of hypertension, hyperlipidemia, hypertriglyceridemia, palpitations, and sleep apnea, presents for follow-up related to hypertension.  Past Medical History    Past Medical History:  Diagnosis Date   Allergy    Arthritis    Cataract    bilat   COPD (chronic obstructive pulmonary disease) ([place])    very mild vs mild asthma    Diastolic dysfunction    a. 02/2020 Echo: EF 60-65%, mod LVH, GrII DD, no rwma, no significant valvular dzs.   Diverticulitis    GERD (gastroesophageal reflux disease)    History of stress test    a. 05/2001 Neg ex cardiolite; b. 10/2013 neg ETT.   Hyperlipidemia    Hypertension    Hypertriglyceridemia    Palpitations    a. managed w/ bb and ccb.   Polycystic kidney disease    Sleep apnea    wears cpap    Past Surgical History:  Procedure Laterality Date   CATARACT EXTRACTION     Childbirth     COLONOSCOPY     TOTAL ABDOMINAL HYSTERECTOMY W/ BILATERAL SALPINGOOPHORECTOMY     UPPER GASTROINTESTINAL ENDOSCOPY      Allergies  Allergies  Allergen Reactions   Albuterol Other (See Comments)   Codeine Nausea And Vomiting   Icosapent Ethyl     Other reaction(s): Tachycardia   Latex     whelps   Morphine Nausea And Vomiting   Predicort [Prednisolone] Other (See Comments)    Cannot take orally, states that she can take via injection    Penicillins Swelling and Rash    Childhood reaction    History of Present Illness    66 year old female with above past medical history including hypertension, hyperlipidemia, hypertriglyceridemia, palpitations, and sleep apnea.  She has previously undergone stress testing with negative studies in March 2003 and again in August 2015.  Most recent echo December 2021 showed an EF of 60 to  65% with moderate LVH, grade 2 diastolic dysfunction, and no significant valvular disease.  In the setting of palpitations and hypertension, she has historically been managed with bisoprolol-HCTZ and diltiazem.  She had initially tried atenolol but this caused significant fatigue thus prompting change to bisoprolol.  Shelley [name] was last in cardiology clinic in April 2021, at which time she was feeling well.  Over the past several months, she has noticed a rise in her blood pressure.  In September, she was placed on losartan 25 mg daily by her primary care provider and this has since been titrated to 100 mg daily.  Blood pressures are often in the 160s at home, though she is 140/96 today.  She is fairly active at work but does not routinely exercise.  She is careful with her sodium intake and tries to avoid carbohydrates in the setting of a long history of hypertriglyceridemia.  She denies chest pain, dyspnea, PND, orthopnea, dizziness, syncope, edema, or early satiety.  She rarely experiences palpitations.  Home Medications    Current Outpatient Medications  Medication Sig Dispense Refill   aspirin EC 81 MG tablet Take 81 mg by mouth at bedtime.     bisoprolol-hydrochlorothiazide (ZIAC) 5-6.25 MG tablet Take 1 tablet by mouth 2 (two) times daily. Pt taking two times daily.     Cholecalciferol (VITAMIN D) 125  MCG (5000 UT) CAPS Take by mouth.     diltiazem (CARDIZEM CD) 360 MG 24 hr capsule Take 1 capsule (360 mg total) by mouth daily. 90 capsule 3   estradiol (ESTRACE VAGINAL) 0.1 MG/GM vaginal cream Insert 1 gram vaginally twice weekly 42.5 g 2   fenofibrate 160 MG tablet Take 160 mg by mouth every morning.     lansoprazole (PREVACID) 30 MG capsule Take 30 mg by mouth daily at 12 noon.     losartan (COZAAR) 100 MG tablet Take 100 mg by mouth daily.     LUMIGAN 0.01 % SOLN      metFORMIN (GLUCOPHAGE-XR) 500 MG 24 hr tablet 1 tablet with evening meal Orally Once a day for 90 days     montelukast  (SINGULAIR) 10 MG tablet Take 10 mg by mouth every morning.      Multiple Vitamin (MULTIVITAMIN) tablet Take 1 tablet by mouth daily.     rosuvastatin (CRESTOR) 40 MG tablet Take 20 mg by mouth every evening.      Current Facility-Administered Medications  Medication Dose Route Frequency Provider Last Rate Last Admin   0.9 %  sodium chloride infusion  500 mL Intravenous Once [place] Park, MD         Review of Systems    Very palpitation.  She denies chest pain, dyspnea, PND, orthopnea, dizziness, syncope, edema, or early satiety.  All other systems reviewed and are otherwise negative except as noted above.    Physical Exam    VS:  BP (!) 146/86   Pulse 74   Ht '5\' 6"'$  (1.676 m)   Wt 171 lb (77.6 kg)   SpO2 96%   BMI 27.60 kg/m  , BMI Body mass index is 27.6 kg/m.     Vitals:   03/03/22 1454 03/03/22 1552  BP: (!) 140/96 (!) 146/86  Pulse: 74   SpO2: 96%     GEN: Well nourished, well developed, in no acute distress. HEENT: normal. Neck: Supple, no JVD, carotid bruits, or masses. Cardiac: RRR, 2/6 systolic murmur at the upper sternal borders, no rubs or gallops. No clubbing, cyanosis, edema.  Radials 2+/PT 2+ and equal bilaterally.  Respiratory:  Respirations regular and unlabored, clear to auscultation bilaterally. GI: Soft, nontender, nondistended, BS + x 4. MS: no deformity or atrophy. Skin: warm and dry, no rash. Neuro:  Strength and sensation are intact. Psych: Normal affect.  Accessory Clinical Findings    ECG personally reviewed by me today -regular sinus rhythm, 74, early repolarization- no acute changes.  Labs dated November 16, 2021 from her primary care provider:  Hemoglobin 14.6, hematocrit 43.9, WBC 5.5, platelets 422 Sodium 132, potassium 4.2, chloride 92, CO2 22, BUN 13, creatinine 0.69, glucose 106 Total protein 7.1, albumin 4.9, calcium 10.4 Total bilirubin 0.3, alkaline phosphatase 39, AST 23, ALT 22 Total cholesterol 153, triglycerides 358, HDL  30, LDL 67 TSH 3.380 Hemoglobin A1c 5.7  Assessment & Plan    1.  Essential hypertension: With several months, patient is had elevated blood pressures.  She was recently placed on losartan 25 mg daily and this has since been titrated to 100 mg daily.  She is also on bisoprolol-HCTZ and diltiazem 240 mg daily.  We discussed potentially switching her bisoprolol to carvedilol however, she is a history of fatigue on atenolol and would prefer not to change beta-blocker if other options exist.  I am going to increase her diltiazem to 360 mg daily.  She will continue to follow blood  pressure over the next few weeks at home.  If on follow-up in approximately 3 to 4 weeks, she remains hypertensive, I would plan to check serum catecholamines and cortisol as well as a renal artery duplex to rule out renal artery stenosis.  From a medication standpoint addition of spironolactone would likely be a next step.  2.  Palpitations: Well-controlled on bisoprolol and diltiazem.  Adjusting diltiazem in the setting of above.  3.  Hyperlipidemia/hypertriglyceridemia: Labs September showed an LDL of 67 with triglycerides of 358.  She is on rosuvastatin 40 and Tricor.  She had previously been prescribed Vascepa however insurance would not cover.  Her primary care provider has discussed Wegovy with her and she is considering but first, will pay closer attention to her diet and begin exercising.  We discussed the importance of moderately vigorous activity 30 minutes a day.  4.  Disposition: Follow-up in 3 to 4 weeks to reevaluate blood pressure.  If necessary, consider additional workup of secondary hypertension.  Murray Hodgkins, NP 03/03/2022, 4:18 PM

## 2022-03-07 ENCOUNTER — Encounter: Payer: Self-pay | Admitting: Nurse Practitioner

## 2022-03-10 DIAGNOSIS — H40013 Open angle with borderline findings, low risk, bilateral: Secondary | ICD-10-CM | POA: Diagnosis not present

## 2022-03-16 ENCOUNTER — Emergency Department (HOSPITAL_COMMUNITY)
Admission: EM | Admit: 2022-03-16 | Discharge: 2022-03-16 | Disposition: A | Discharge status: Home / Self Care | Payer: BC Managed Care – PPO | Attending: Emergency Medicine | Admitting: Emergency Medicine

## 2022-03-16 ENCOUNTER — Encounter (HOSPITAL_COMMUNITY): Payer: Self-pay

## 2022-03-16 ENCOUNTER — Telehealth: Payer: Self-pay | Admitting: Cardiology

## 2022-03-16 ENCOUNTER — Emergency Department (HOSPITAL_COMMUNITY): Payer: BC Managed Care – PPO

## 2022-03-16 DIAGNOSIS — Z7984 Long term (current) use of oral hypoglycemic drugs: Secondary | ICD-10-CM | POA: Insufficient documentation

## 2022-03-16 DIAGNOSIS — R519 Headache, unspecified: Secondary | ICD-10-CM | POA: Diagnosis not present

## 2022-03-16 DIAGNOSIS — Z79899 Other long term (current) drug therapy: Secondary | ICD-10-CM

## 2022-03-16 DIAGNOSIS — R079 Chest pain, unspecified: Secondary | ICD-10-CM | POA: Diagnosis not present

## 2022-03-16 DIAGNOSIS — Z7982 Long term (current) use of aspirin: Secondary | ICD-10-CM | POA: Insufficient documentation

## 2022-03-16 DIAGNOSIS — R0789 Other chest pain: Secondary | ICD-10-CM | POA: Diagnosis not present

## 2022-03-16 DIAGNOSIS — I1A0 Resistant hypertension: Secondary | ICD-10-CM | POA: Diagnosis not present

## 2022-03-16 DIAGNOSIS — Z91040 Latex allergy status: Secondary | ICD-10-CM | POA: Diagnosis not present

## 2022-03-16 DIAGNOSIS — E871 Hypo-osmolality and hyponatremia: Secondary | ICD-10-CM

## 2022-03-16 DIAGNOSIS — I10 Essential (primary) hypertension: Secondary | ICD-10-CM | POA: Insufficient documentation

## 2022-03-16 LAB — TROPONIN I (HIGH SENSITIVITY)
Troponin I (High Sensitivity): 3 ng/L (ref <18)
Troponin I (High Sensitivity): 4 ng/L (ref <18)

## 2022-03-16 LAB — URINALYSIS, ROUTINE W REFLEX MICROSCOPIC
Bacteria, UA: NONE SEEN
Bilirubin Urine: NEGATIVE
Glucose, UA: NEGATIVE mg/dL
Ketones, ur: NEGATIVE mg/dL
Leukocytes,Ua: NEGATIVE
Nitrite: NEGATIVE
Protein, ur: 30 mg/dL — AB
Specific Gravity, Urine: 1.008 (ref 1.005–1.030)
pH: 7 (ref 5.0–8.0)

## 2022-03-16 LAB — COMPREHENSIVE METABOLIC PANEL WITH GFR
ALT: 24 U/L (ref 0–44)
AST: 27 U/L (ref 15–41)
Albumin: 4.6 g/dL (ref 3.5–5.0)
Alkaline Phosphatase: 30 U/L — ABNORMAL LOW (ref 38–126)
Anion gap: 13 (ref 5–15)
BUN: 18 mg/dL (ref 8–23)
CO2: 22 mmol/L (ref 22–32)
Calcium: 9.6 mg/dL (ref 8.9–10.3)
Chloride: 91 mmol/L — ABNORMAL LOW (ref 98–111)
Creatinine, Ser: 0.69 mg/dL (ref 0.44–1.00)
GFR, Estimated: >60 mL/min
Glucose, Bld: 102 mg/dL — ABNORMAL HIGH (ref 70–99)
Potassium: 4.1 mmol/L (ref 3.5–5.1)
Sodium: 126 mmol/L — ABNORMAL LOW (ref 135–145)
Total Bilirubin: 0.6 mg/dL (ref 0.3–1.2)
Total Protein: 7.5 g/dL (ref 6.5–8.1)

## 2022-03-16 LAB — CBC WITH DIFFERENTIAL/PLATELET
Abs Immature Granulocytes: 0.03 10*3/uL (ref 0.00–0.07)
Basophils Absolute: 0 10*3/uL (ref 0.0–0.1)
Basophils Relative: 0 %
Eosinophils Absolute: 0 10*3/uL (ref 0.0–0.5)
Eosinophils Relative: 1 %
HCT: 39.8 % (ref 36.0–46.0)
Hemoglobin: 13.3 g/dL (ref 12.0–15.0)
Immature Granulocytes: 0 %
Lymphocytes Relative: 19 %
Lymphs Abs: 1.6 10*3/uL (ref 0.7–4.0)
MCH: 28.7 pg (ref 26.0–34.0)
MCHC: 33.4 g/dL (ref 30.0–36.0)
MCV: 85.8 fL (ref 80.0–100.0)
Monocytes Absolute: 0.6 10*3/uL (ref 0.1–1.0)
Monocytes Relative: 7 %
Neutro Abs: 6.4 10*3/uL (ref 1.7–7.7)
Neutrophils Relative %: 73 %
Platelets: 444 10*3/uL — ABNORMAL HIGH (ref 150–400)
RBC: 4.64 MIL/uL (ref 3.87–5.11)
RDW: 13 % (ref 11.5–15.5)
WBC: 8.7 10*3/uL (ref 4.0–10.5)
nRBC: 0 % (ref 0.0–0.2)

## 2022-03-16 MED ORDER — SPIRONOLACTONE 25 MG PO TABS: 12.5000 mg | ORAL_TABLET | Freq: Every day | ORAL | 0 refills | Status: DC

## 2022-03-16 NOTE — Telephone Encounter (Signed)
Pt c/o BP issue: STAT if pt c/o blurred vision, one-sided weakness or slurred speech  1. What are your last 5 BP readings? 161/78 today, last night 170/80, 174/80  2. Are you having any other symptoms (ex. Dizziness, headache, blurred vision, passed out)? Headache but states she always has a headache, feels jittery  3. What is your BP issue? Patient states her BP is still very high.

## 2022-03-16 NOTE — ED Provider Triage Note (Signed)
Emergency Medicine Provider Triage Evaluation Note  ARTIE TAKAYAMA , a 67 y.o. female  was evaluated in triage.  Pt complains of high BP with frontal headache and left CP. BP high, has been to cards in December, had meds increased, compliant with BP meds x 3. No unilateral weakness, numbness, changes in vision/speech/gait Review of Systems  Positive: As above Negative: As above  Physical Exam  BP (!) 173/85   Pulse 74   Temp 98.8 F (37.1 C) (Oral)   Resp 18   Ht '5\' 6"'$  (1.676 m)   Wt 77.1 kg   SpO2 97%   BMI 27.44 kg/m  Gen:   Awake, no distress   Resp:  Normal effort  MSK:   Moves extremities without difficulty  Other:  Lungs CTA, no lower ext edema  Medical Decision Making  Medically screening exam initiated at 1:59 PM.  Appropriate orders placed.  DENESHIA ZUCKER was informed that the remainder of the evaluation will be completed by another provider, this initial triage assessment does not replace that evaluation, and the importance of remaining in the ED until their evaluation is complete.     Tacy Learn, PA-C 03/16/22 1404

## 2022-03-16 NOTE — ED Triage Notes (Signed)
Pt arrives via POV, states BP was 189/90 at 1215 at home and she has had blurry vision and a headache since Sunday. Rates headache 6/10 currently. States, "I feel like my inside are vibrating." Denies chest pain. BP 173/85 in triage

## 2022-03-16 NOTE — Telephone Encounter (Signed)
Patient currently in the [facility] ER for c/o elevated BP.Shall we wait to see the resolution before I call her with our recommendations?

## 2022-03-16 NOTE — ED Provider Notes (Signed)
[facility] EMERGENCY DEPARTMENT Provider Note   CSN: [id number] Arrival date & time: 03/16/22  1224     History  Chief Complaint  Patient presents with   Hypertension    Shelley [name] is a 67 y.o. female.  Pt complains of high BP with frontal headache and left CP. BP high, has been to cards in December, had meds increased, compliant with BP meds x 3. No unilateral weakness, numbness, changes in vision/speech/gait        Home Medications Prior to Admission medications   Medication Sig Start Date End Date Taking? Authorizing Provider  spironolactone (ALDACTONE) 25 MG tablet Take 0.5 tablets (12.5 mg total) by mouth daily. 03/16/22  Yes Tacy Learn, PA-C  aspirin EC 81 MG tablet Take 81 mg by mouth at bedtime.    [provider]  bisoprolol-hydrochlorothiazide (ZIAC) 5-6.25 MG tablet Take 1 tablet by mouth 2 (two) times daily. Pt taking two times daily.    [provider]  Cholecalciferol (VITAMIN D) 125 MCG (5000 UT) CAPS Take by mouth.    [provider]  diltiazem (CARDIZEM CD) 360 MG 24 hr capsule Take 1 capsule (360 mg total) by mouth daily. 03/03/22   Theora Gianotti, NP  estradiol [facility] VAGINAL) 0.1 MG/GM vaginal cream Insert 1 gram vaginally twice weekly 11/23/21   Marny Lowenstein A, NP  fenofibrate 160 MG tablet Take 160 mg by mouth every morning. 08/22/13   [provider]  lansoprazole (PREVACID) 30 MG capsule Take 30 mg by mouth daily at 12 noon.    [provider]  losartan (COZAAR) 100 MG tablet Take 100 mg by mouth daily. 01/23/22   [provider]  LUMIGAN 0.01 % SOLN  08/22/19   [provider]  metFORMIN (GLUCOPHAGE-XR) 500 MG 24 hr tablet 1 tablet with evening meal Orally Once a day for 90 days 08/15/21   [provider]  montelukast (SINGULAIR) 10 MG tablet Take 10 mg by mouth every morning.  11/10/11   [provider]  Multiple Vitamin (MULTIVITAMIN) tablet Take 1  tablet by mouth daily.    [provider]  rosuvastatin (CRESTOR) 40 MG tablet Take 20 mg by mouth every evening.     [provider]      Allergies    Albuterol, Codeine, Icosapent ethyl, Latex, Morphine, Predicort [prednisolone], and Penicillins    Review of Systems   Review of Systems Negative except as per HPI Physical Exam Updated Vital Signs BP (!) 142/64   Pulse 65   Temp 98.7 F (37.1 C) (Oral)   Resp 16   Ht '5\' 6"'$  (1.676 m)   Wt 77.1 kg   SpO2 99%   BMI 27.44 kg/m  Physical Exam Vitals and nursing note reviewed.  Constitutional:      General: She is not in acute distress.    Appearance: She is well-developed. She is not diaphoretic.  HENT:     Head: Normocephalic and atraumatic.     Mouth/Throat:     Mouth: Mucous membranes are moist.  Eyes:     Extraocular Movements: Extraocular movements intact.     Pupils: Pupils are equal, round, and reactive to light.  Cardiovascular:     Rate and Rhythm: Normal rate and regular rhythm.     Heart sounds: Normal heart sounds.  Pulmonary:     Effort: Pulmonary effort is normal.     Breath sounds: Normal breath sounds.  Musculoskeletal:     Cervical back: Neck  supple.     Right lower leg: No edema.     Left lower leg: No edema.  Skin:    General: Skin is warm and dry.     Findings: No erythema or rash.  Neurological:     Mental Status: She is alert and oriented to person, place, and time.     Cranial Nerves: No cranial nerve deficit.     Sensory: No sensory deficit.     Motor: No weakness.     Gait: Gait normal.  Psychiatric:        Behavior: Behavior normal.     ED Results / Procedures / Treatments   Labs (all labs ordered are listed, but only abnormal results are displayed) Labs Reviewed  CBC WITH DIFFERENTIAL/PLATELET - Abnormal; Notable for the following components:      Result Value   Platelets 444 (*)    All other components within normal limits  COMPREHENSIVE METABOLIC PANEL -  Abnormal; Notable for the following components:   Sodium 126 (*)    Chloride 91 (*)    Glucose, Bld 102 (*)    Alkaline Phosphatase 30 (*)    All other components within normal limits  URINALYSIS, ROUTINE W REFLEX MICROSCOPIC - Abnormal; Notable for the following components:   Color, Urine STRAW (*)    Hgb urine dipstick SMALL (*)    Protein, ur 30 (*)    All other components within normal limits  TROPONIN I (HIGH SENSITIVITY)  TROPONIN I (HIGH SENSITIVITY)    EKG EKG Interpretation  Date/Time:  Thursday March 16 2022 16:18:47 EST Ventricular Rate:  73 PR Interval:  145 QRS Duration: 81 QT Interval:  401 QTC Calculation: 442 R Axis:   35 Text Interpretation: Sinus rhythm Abnormal R-wave progression, early transition No significant change since prior 8/15 Confirmed by Aletta Edouard [phone]) on 03/16/2022 4:22:55 PM  Radiology CT Head Wo Contrast  Result Date: 03/16/2022 CLINICAL DATA:  New onset headache.  Hypertension. EXAM: CT HEAD WITHOUT CONTRAST TECHNIQUE: Contiguous axial images were obtained from the base of the skull through the vertex without intravenous contrast. RADIATION DOSE REDUCTION: This exam was performed according to the departmental dose-optimization program which includes automated exposure control, adjustment of the mA and/or kV according to patient size and/or use of iterative reconstruction technique. COMPARISON:  CT head 04/08/2013 FINDINGS: Brain: Ventricle size and cerebral volume normal. Mild patchy white matter hypodensity bilaterally with mild progression. Negative for acute infarct, hemorrhage, mass Vascular: Negative for hyperdense vessel Skull: Negative Sinuses/Orbits: Paranasal sinuses clear. Bilateral cataract extraction Other: None IMPRESSION: No acute abnormality. Mild white matter changes likely due to chronic microvascular ischemia. Electronically Signed   By: Franchot Gallo M.D.   On: 03/16/2022 16:09   DG Chest 2 View  Result Date:  03/16/2022 CLINICAL DATA:  Chest pain hypertension EXAM: CHEST - 2 VIEW COMPARISON:  Chest 10/27/2013 FINDINGS: The heart size and mediastinal contours are within normal limits. Both lungs are clear. The visualized skeletal structures are unremarkable. IMPRESSION: No active cardiopulmonary disease. Electronically Signed   By: Franchot Gallo M.D.   On: 03/16/2022 14:32    Procedures Procedures    Medications Ordered in ED Medications - No data to display  ED Course/ Medical Decision Making/ A&P                           Medical Decision Making Amount and/or Complexity of Data Reviewed Labs: ordered. Radiology: ordered.   This  patient presents to the ED for concern of elevated blood pressure with frontal headache and chest discomfort, this involves an extensive number of treatment options, and is a complaint that carries with it a high risk of complications and morbidity.  The differential diagnosis includes but not limited to ACS, CVA, hypertension, metabolic disturbance   Co morbidities that complicate the patient evaluation  Polycystic kidney disease, GERD, COPD, diastolic dysfunction, hyperlipidemia, hypertension   Additional history obtained:  External records from outside source obtained and reviewed including office visit to cardiology dated 03/03/2022 which outlines plan for further workup and addition of spironolactone if blood pressure remains elevated, patient is aware of this plan.   Lab Tests:  I Ordered, and personally interpreted labs.  The pertinent results include: CMP with mild hyponatremia with sodium of 126.  Urinalysis with small minute protein hemoglobin.  CBC without significant findings.  Troponins are flat at 4 and 3.   Imaging Studies ordered:  I ordered imaging studies including CT head, chest x-ray I independently visualized and interpreted imaging which showed no acute intracranial normality, no acute cardiopulmonary disease I agree with the  radiologist interpretation   Cardiac Monitoring: / EKG:  The patient was maintained on a cardiac monitor.  I personally viewed and interpreted the cardiac monitored which showed an underlying rhythm of: Sinus rhythm, rate 73   Consultations Obtained:  I requested consultation with the ER attending, Dr. Melina Copa,  and discussed lab and imaging findings as well as pertinent plan - they recommend: add spironolactone, PCP/cards to follow for monitoring labs (K- discussed with patient)    Problem List / ED Course / Critical interventions / Medication management  67 year old female presents with concern for persistently elevated blood pressure, progressively worsening/not improving with change in meds over the past few months now with frontal headache and chest discomfort.  Neuroexam unremarkable, CT head without acute findings.  EKG without acute ischemic changes, troponins negative x 2.  Does have small amount of protein in her urine.  Review of cardiology note, will offer starting spironolactone per their recommendation.  Blood pressure improved prior to discharge at 142/64.  Discussed with patient concern for lowering her blood pressure too much, recommend starting with half a tablet (12.5 mg), monitor BP and stop if blood pressure too low.  Also discussed potential for developing hypokalemia when taking this medication combination with her losartan.  Advised to recheck with PCP/cardiology for close monitoring.  Patient to follow-up with cardiology and PCP. I have reviewed the patients home medicines and have made adjustments as needed   Social Determinants of Health:  Has PCP and cardiology follow-up   Test / Admission - Considered:  After comprehensive valuation the emergency room, blood pressure has improved, currently 142/64.  Felt stable for discharge to recheck with PCP/cardiology.         Final Clinical Impression(s) / ED Diagnoses Final diagnoses:  Resistant hypertension   Nonintractable episodic headache, unspecified headache type  Chest discomfort  Hyponatremia    Rx / DC Orders ED Discharge Orders          Ordered    spironolactone (ALDACTONE) 25 MG tablet  Daily        03/16/22 1807              Tacy Learn, PA-C 03/16/22 1906    Hayden Rasmussen, MD 03/17/22 1009

## 2022-03-16 NOTE — Telephone Encounter (Signed)
Thank you for the follow-up.  I'd like her to start spironolactone 25 mg daily w/ plan for a f/u bmet in 1 wk after starting (very important she has the lab work as potassium may rise and we need to be on top of that).

## 2022-03-16 NOTE — Discharge Instructions (Addendum)
Please follow up with your cardiologist. You will need further work up and monitoring as outlined in your cardiologist's notes.  Start with half a tablet of spironolactone once daily.  Will need close follow-up with either your PCP or cardiologist to recheck your labs, specifically your potassium.  Monitor your blood pressure, discontinue this medication if your blood pressure is low or you are feeling dizzy or lightheaded. Return to ER for worsening or concerning symptoms.

## 2022-03-16 NOTE — ED Notes (Signed)
Pt d/c home per MD order. Discharge summary reviewed with pt, pt verbalizes understanding. Ambulatory of unit , no s/s of acute distress noted at discharge.

## 2022-03-17 NOTE — Telephone Encounter (Signed)
According to ED note, patient was started on spironolactone 25 mg qd.I placed order for BMET.

## 2022-03-23 ENCOUNTER — Other Ambulatory Visit (HOSPITAL_COMMUNITY)
Admission: RE | Admit: 2022-03-23 | Discharge: 2022-03-23 | Disposition: A | Discharge status: Home / Self Care | Payer: BC Managed Care – PPO | Source: Ambulatory Visit | Attending: Cardiology | Admitting: Cardiology

## 2022-03-23 DIAGNOSIS — I10 Essential (primary) hypertension: Secondary | ICD-10-CM | POA: Diagnosis not present

## 2022-03-23 DIAGNOSIS — Z79899 Other long term (current) drug therapy: Secondary | ICD-10-CM | POA: Diagnosis not present

## 2022-03-23 DIAGNOSIS — E663 Overweight: Secondary | ICD-10-CM | POA: Diagnosis not present

## 2022-03-23 DIAGNOSIS — E538 Deficiency of other specified B group vitamins: Secondary | ICD-10-CM | POA: Diagnosis not present

## 2022-03-23 DIAGNOSIS — Z6828 Body mass index (BMI) 28.0-28.9, adult: Secondary | ICD-10-CM | POA: Diagnosis not present

## 2022-03-23 LAB — BASIC METABOLIC PANEL
Anion gap: 12 (ref 5–15)
BUN: 19 mg/dL (ref 8–23)
CO2: 23 mmol/L (ref 22–32)
Calcium: 9.7 mg/dL (ref 8.9–10.3)
Chloride: 90 mmol/L — ABNORMAL LOW (ref 98–111)
Creatinine, Ser: 0.85 mg/dL (ref 0.44–1.00)
GFR, Estimated: >60 mL/min (ref >60)
Glucose, Bld: 135 mg/dL — ABNORMAL HIGH (ref 70–99)
Potassium: 4.4 mmol/L (ref 3.5–5.1)
Sodium: 125 mmol/L — ABNORMAL LOW (ref 135–145)

## 2022-03-27 ENCOUNTER — Telehealth: Payer: Self-pay

## 2022-03-27 NOTE — Telephone Encounter (Signed)
Results discussed with patient.She is hesitant to to stop aldactone because of her BP.She has f/u apt in 2 days and will discuss then.

## 2022-03-27 NOTE — Telephone Encounter (Signed)
-----  Message from Satira Sark, MD sent at 03/23/2022  3:42 PM EST ----- Lab work ordered under my name.  I reviewed the chart, I have not seen Ms. Sanchez since 2021.  She had a recent visit with Mr. Sharolyn Douglas NP, I reviewed that note as well as a subsequent ER visit.  Looks like most recent medication addition with spironolactone.  Her sodium has trended down, now 125, potassium normal and creatinine normal.  I would suggest stopping spironolactone and keep follow-up for other medication adjustments.

## 2022-03-29 ENCOUNTER — Encounter: Payer: Self-pay | Admitting: Cardiology

## 2022-03-29 ENCOUNTER — Ambulatory Visit: Payer: BC Managed Care – PPO | Attending: Cardiology | Admitting: Cardiology

## 2022-03-29 VITALS — BP 138/60 | HR 82 | Ht 66.0 in | Wt 175.8 lb

## 2022-03-29 DIAGNOSIS — E871 Hypo-osmolality and hyponatremia: Secondary | ICD-10-CM | POA: Diagnosis not present

## 2022-03-29 DIAGNOSIS — I10 Essential (primary) hypertension: Secondary | ICD-10-CM | POA: Diagnosis not present

## 2022-03-29 DIAGNOSIS — E782 Mixed hyperlipidemia: Secondary | ICD-10-CM | POA: Diagnosis not present

## 2022-03-29 NOTE — Patient Instructions (Addendum)
Medication Instructions:  Your physician recommends that you continue on your current medications as directed. Please refer to the Current Medication list given to you today.  Labwork: BMET at [employer] Non-fasting  Testing/Procedures: none  Follow-Up: Your physician recommends that you schedule a follow-up appointment in: pending  Any Other Special Instructions Will Be Listed Below (If Applicable).  If you need a refill on your cardiac medications before your next appointment, please call your pharmacy.

## 2022-03-29 NOTE — Progress Notes (Signed)
Cardiology Office Note  Date: 03/29/2022   ID: Shelley [name] 01/09/1956, MRN [id number]  PCP:  Shelley Samples, PA-C  Cardiologist:  Shelley Lesches, MD Electrophysiologist:  None   Chief Complaint  Patient presents with   Cardiac follow-up    History of Present Illness: Shelley [name] is a 67 y.o. female seen recently in the office by Mr. Sharolyn Douglas NP in late December 2023, I reviewed the note as well as subsequent medication changes and lab work.  I have not seen her since 2021.  She was seen for worsening blood pressure on baseline history of essential hypertension.  Diltiazem CD was increased and she was ultimately started on Aldactone after ER visit on January 4.  I see that she was found to be hyponatremic with sodium 126 on January 4 at ER visit, down to 125 one week later on Aldactone.  Renal function and potassium remain normal.  Lab work from October 2023 showed mild hyponatremia as well with sodium of 132.  She did have a head CT during her ER visit on January for which did not describe any acute abnormalities.  She presents today stating that she feels "wonderful."  Her blood pressure is better controlled.  She has stayed on Aldactone although I did recommend holding it after I saw the most recent lab work.  She brought in paperwork showing me that she has had hyponatremia to mild degree documented for years.  Not clear that this has ever been sorted out in terms of an etiology.  She otherwise does not report any palpitations or chest pain.  Past Medical History:  Diagnosis Date   Allergy    Arthritis    Cataract    COPD (chronic obstructive pulmonary disease) (HCC)    Diastolic dysfunction    Diverticulitis    GERD (gastroesophageal reflux disease)    History of stress test    a. 05/2001 Neg ex cardiolite; b. 10/2013 neg ETT.   Hyperlipidemia    Hypertension    Hypertriglyceridemia    Palpitations    a. managed w/ bb and ccb.   Polycystic kidney disease     Sleep apnea    wears cpap     Current Outpatient Medications  Medication Sig Dispense Refill   aspirin EC 81 MG tablet Take 81 mg by mouth at bedtime.     bisoprolol-hydrochlorothiazide (ZIAC) 5-6.25 MG tablet Take 1 tablet by mouth 2 (two) times daily. Pt taking two times daily.     Cholecalciferol (VITAMIN D) 125 MCG (5000 UT) CAPS Take by mouth.     diltiazem (CARDIZEM CD) 360 MG 24 hr capsule Take 1 capsule (360 mg total) by mouth daily. 90 capsule 3   estradiol (ESTRACE VAGINAL) 0.1 MG/GM vaginal cream Insert 1 gram vaginally twice weekly 42.5 g 2   fenofibrate 160 MG tablet Take 160 mg by mouth every morning.     lansoprazole (PREVACID) 30 MG capsule Take 30 mg by mouth daily at 12 noon.     losartan (COZAAR) 100 MG tablet Take 100 mg by mouth daily.     LUMIGAN 0.01 % SOLN Place 1 drop into both eyes at bedtime.     metFORMIN (GLUCOPHAGE-XR) 500 MG 24 hr tablet Take 500 mg by mouth 2 (two) times daily with a meal.     montelukast (SINGULAIR) 10 MG tablet Take 10 mg by mouth every morning.      Multiple Vitamin (MULTIVITAMIN) tablet Take 1 tablet by mouth  daily.     rosuvastatin (CRESTOR) 40 MG tablet Take 20 mg by mouth every evening.      spironolactone (ALDACTONE) 25 MG tablet Take 12.5 mg by mouth daily.     Current Facility-Administered Medications  Medication Dose Route Frequency Provider Last Rate Last Admin   0.9 %  sodium chloride infusion  500 mL Intravenous Once Shelley Park, MD       Allergies:  Albuterol, Codeine, Icosapent ethyl, Latex, Morphine, Predicort [prednisolone], and Penicillins   ROS: No syncope.  Physical Exam: VS:  BP 138/60   Pulse 82   Ht '5\' 6"'$  (1.676 m)   Wt 175 lb 12.8 oz (79.7 kg)   SpO2 97%   BMI 28.37 kg/m , BMI Body mass index is 28.37 kg/m.  Wt Readings from Last 3 Encounters:  03/29/22 175 lb 12.8 oz (79.7 kg)  03/16/22 170 lb (77.1 kg)  03/03/22 171 lb (77.6 kg)    General: Patient appears comfortable at rest. HEENT:  Conjunctiva and lids normal. Neck: Supple, no elevated JVP or carotid bruits. Lungs: Clear to auscultation, nonlabored breathing at rest. Cardiac: Regular rate and rhythm, no S3, 2/6 systolic murmur. Extremities: No pitting edema.  ECG:  An ECG dated 03/16/2022 was personally reviewed today and demonstrated:  Sinus rhythm.  Recent Labwork: 03/16/2022: ALT 24; AST 27; Hemoglobin 13.3; Platelets 444 03/23/2022: BUN 19; Creatinine, Ser 0.85; Potassium 4.4; Sodium 125   Other Studies Reviewed Today:  Echocardiogram 04/04/2019:  1. Left ventricular ejection fraction, by visual estimation, is 60 to  65%. The left ventricle has normal function. There is moderately increased  left ventricular hypertrophy.   2. Left ventricular diastolic parameters are consistent with Grade II  diastolic dysfunction (pseudonormalization).   3. The left ventricle has no regional wall motion abnormalities.   4. Global right ventricle has normal systolic function.The right  ventricular size is normal. No increase in right ventricular wall  thickness.   5. Left atrial size was normal.   6. Right atrial size was normal.   7. Mild mitral annular calcification.   8. The mitral valve is grossly normal. No evidence of mitral valve  regurgitation.   9. The tricuspid valve is grossly normal.  10. The tricuspid valve is grossly normal. Tricuspid valve regurgitation  is not demonstrated.  11. The aortic valve was not well visualized. Aortic valve regurgitation  is not visualized. No evidence of aortic valve sclerosis or stenosis.  12. The pulmonic valve was not well visualized. Pulmonic valve  regurgitation is not visualized.  13. The inferior vena cava is normal in size with greater than 50%  respiratory variability, suggesting right atrial pressure of 3 mmHg.  14. The interatrial septum was not well visualized.   Assessment and Plan:  1.  History of palpitations and correlation with suspected PACs and PVCs.   Presently well-controlled on current regimen including Cardizem CD 360 mg daily as well as Ziac 5/6.25 mg daily.  2.  Essential hypertension, blood pressure trend better controlled following recent medication adjustments including up titration of Cardizem CD and addition of Aldactone.  She is also on Cozaar and Ziac.  3.  Hyponatremia, looks to be chronic but typically mild.  Recent sodium was 126 at ER encounter prior to initiation of Aldactone, subsequently 125 a week later.  She is not clearly symptomatic.  Had been having some headaches previously but these have resolved.  Head CT done during ER visit showed no acute findings.  I discussed my concerns  about hyponatremia and potentially needing further workup for etiology.  She seems hesitant to consider this initially.  Plan to obtain a follow-up BMET and then proceed from there.  Medication Adjustments/Labs and Tests Ordered: Current medicines are reviewed at length with the patient today.  Concerns regarding medicines are outlined above.   Tests Ordered: Orders Placed This Encounter  Procedures   Basic metabolic panel    Medication Changes: No orders of the defined types were placed in this encounter.   Disposition:  Follow up  test results.  Signed, Satira Sark, MD, [facility] 03/29/2022 4:28 PM    [place] [facility] [place], [place], [place] [address] Phone: [phone]; Fax: [phone]

## 2022-04-04 DIAGNOSIS — E871 Hypo-osmolality and hyponatremia: Secondary | ICD-10-CM | POA: Diagnosis not present

## 2022-04-05 LAB — BASIC METABOLIC PANEL
BUN/Creatinine Ratio: 18 (ref 12–28)
BUN: 16 mg/dL (ref 8–27)
CO2: 21 mmol/L (ref 20–29)
Calcium: 10.6 mg/dL — ABNORMAL HIGH (ref 8.7–10.3)
Chloride: 93 mmol/L — ABNORMAL LOW (ref 96–106)
Creatinine, Ser: 0.9 mg/dL (ref 0.57–1.00)
Glucose: 85 mg/dL (ref 70–99)
Potassium: 5.1 mmol/L (ref 3.5–5.2)
Sodium: 134 mmol/L (ref 134–144)
eGFR: 71 mL/min/{1.73_m2} (ref >59)

## 2022-04-20 DIAGNOSIS — E871 Hypo-osmolality and hyponatremia: Secondary | ICD-10-CM | POA: Diagnosis not present

## 2022-04-20 DIAGNOSIS — Z6827 Body mass index (BMI) 27.0-27.9, adult: Secondary | ICD-10-CM | POA: Diagnosis not present

## 2022-04-20 DIAGNOSIS — I10 Essential (primary) hypertension: Secondary | ICD-10-CM | POA: Diagnosis not present

## 2022-04-20 DIAGNOSIS — E663 Overweight: Secondary | ICD-10-CM | POA: Diagnosis not present

## 2022-04-20 DIAGNOSIS — E538 Deficiency of other specified B group vitamins: Secondary | ICD-10-CM | POA: Diagnosis not present

## 2022-05-11 ENCOUNTER — Encounter: Payer: Self-pay | Admitting: Radiology

## 2022-05-16 DIAGNOSIS — H5203 Hypermetropia, bilateral: Secondary | ICD-10-CM | POA: Diagnosis not present

## 2022-06-21 DIAGNOSIS — R070 Pain in throat: Secondary | ICD-10-CM | POA: Diagnosis not present

## 2022-06-21 DIAGNOSIS — K219 Gastro-esophageal reflux disease without esophagitis: Secondary | ICD-10-CM | POA: Diagnosis not present

## 2022-07-06 ENCOUNTER — Telehealth: Payer: Self-pay | Admitting: Cardiology

## 2022-07-06 NOTE — Telephone Encounter (Signed)
Spoke with Clovis Riley, NP  with [facility] who states that patient had labs drawn with her yesterday and her potassium was 5.5. Patient had recent labs done with PCP and her potassium was elevated. States that she has been trying to reach out to PCP but has been unsuccessful. Wants to know if Dr. Diona Browner would review labs and medications to see what changes, if any, need to be made. Labs from PCP on 04/20/22 and labs drawn yesterday scanned to chart. Please advise

## 2022-07-06 NOTE — Telephone Encounter (Signed)
Patient made aware, verbalized understanding. States that she has appointment scheduled for 4/26 @ 2 pm with PCP

## 2022-07-06 NOTE — Telephone Encounter (Signed)
Bernerd Limbo, NP from [facility] is requesting call back to discuss labs at PCP recently. Was told that her potasium level is at 5.5 at last lab and she would like to discuss medication and getting more labs done. Requesting call back.

## 2022-07-07 ENCOUNTER — Encounter: Payer: Self-pay | Admitting: Cardiology

## 2022-07-07 DIAGNOSIS — E875 Hyperkalemia: Secondary | ICD-10-CM | POA: Diagnosis not present

## 2022-07-07 DIAGNOSIS — I10 Essential (primary) hypertension: Secondary | ICD-10-CM | POA: Diagnosis not present

## 2022-07-07 DIAGNOSIS — Z6828 Body mass index (BMI) 28.0-28.9, adult: Secondary | ICD-10-CM | POA: Diagnosis not present

## 2022-07-07 DIAGNOSIS — E871 Hypo-osmolality and hyponatremia: Secondary | ICD-10-CM | POA: Diagnosis not present

## 2022-07-07 DIAGNOSIS — E663 Overweight: Secondary | ICD-10-CM | POA: Diagnosis not present

## 2022-07-13 DIAGNOSIS — E875 Hyperkalemia: Secondary | ICD-10-CM | POA: Diagnosis not present

## 2022-07-27 ENCOUNTER — Encounter: Payer: Self-pay | Admitting: *Deleted

## 2022-07-27 ENCOUNTER — Ambulatory Visit: Payer: Self-pay | Admitting: *Deleted

## 2022-07-27 NOTE — Patient Instructions (Signed)
Visit Information  Thank you for taking time to visit with me today. Please don't hesitate to contact me if I can be of assistance to you.   Following are the goals we discussed today:   Goals Addressed             This Visit's Progress    Assess Need for Social Work Involvement.   On track    Care Coordination Interventions:  Interventions Today    Flowsheet Row Most Recent Value  Chronic Disease   Chronic disease during today's visit Hypertension (HTN), Other  [Mixed Hyperlipidemia]  General Interventions   General Interventions Discussed/Reviewed General Interventions Discussed, Labs, Vaccines, Doctor Visits, General Interventions Reviewed, Annual Eye Exam, Durable Medical Equipment (DME), [employer], Level of Care, Communication with, Health Screening  [Encouraged]  Labs Hgb A1c annually  [Encouraged]  Vaccines COVID-19, Tetanus/Pertussis/Diphtheria, Shingles, RSV, Pneumonia, Flu  [Encouraged]  Doctor Visits Discussed/Reviewed Doctor Visits Discussed, Doctor Visits Reviewed, Annual Wellness Visits, PCP  [Encouraged]  Health Screening Bone Density, Colonoscopy, Mammogram  [Encouraged]  Durable Medical Equipment (DME) BP Cuff, Other  [Eyeglasses, Hand-Held Shower Hose]  PCP/Specialist Visits Compliance with follow-up visit  [Encouraged]  Communication with PCP/Specialists  [Encouraged]  Exercise Interventions   Exercise Discussed/Reviewed Exercise Discussed, Exercise Reviewed, Physical Activity  [Encouraged]  Physical Activity Discussed/Reviewed Physical Activity Discussed, Home Exercise Program (HEP), Physical Activity Reviewed, Types of exercise  [Encouraged]  Education Interventions   Education Provided Provided Education  Provided Verbal Education On Nutrition, Mental Health/Coping with Illness, When to see the doctor, Eye Care, Applications, Exercise, Medication, Development worker, community, [employer]  Mental Health Interventions   Mental Health Discussed/Reviewed  Mental Health Discussed, Mental Health Reviewed, Coping Strategies, Crisis, Other, Suicide, Substance Abuse, Grief and Loss, Depression, Anxiety  [Domestic Violence]  Nutrition Interventions   Nutrition Discussed/Reviewed Nutrition Discussed, Adding fruits and vegetables, Increaing proteins, Decreasing fats, Decreasing salt, Nutrition Reviewed, Fluid intake, Carbohydrate meal planning, Portion sizes, Decreasing sugar intake  [Encouraged]  Pharmacy Interventions   Pharmacy Dicussed/Reviewed Pharmacy Topics Discussed, Pharmacy Topics Reviewed, Medication Adherence, Affording Medications  [Encouraged]  Safety Interventions   Safety Discussed/Reviewed Safety Discussed, Safety Reviewed  [Encouraged]  Advanced Directive Interventions   Advanced Directives Discussed/Reviewed Advanced Directives Discussed  [Encouraged Completion]     Danford Bad, BSW, MSW, LCSW  Licensed Clinical Social Worker  [facility]  Mailing [place] [address] [place], [place] [address] Physical Address-[address], [place], [place] [address] Toll Free Main # [phone] Fax # [phone] Cell # [phone] [email or web address]      Please call the care guide team at [phone] if you need to cancel or reschedule your appointment.   If you are experiencing a Mental Health or Behavioral Health Crisis or need someone to talk to, please call the Suicide and Crisis Lifeline: 988 call the [place] National Suicide Prevention Lifeline: 930-722-0125 or TTY: 228-173-3002 TTY (336)681-8923) to talk to a trained counselor call 1-800-273-TALK (toll free, 24 hour hotline) go to [facility] [address], [place] [phone]) call the [facility] Crisis Line: (517) [phone] call 911  Patient verbalizes understanding of instructions and care plan provided today and agrees to view in MyChart. Active MyChart status and  patient understanding of how to access instructions and care plan via MyChart confirmed with patient.     No further follow up required.  Danford Bad, BSW, MSW, LCSW  Licensed Restaurant manager, [facility]  Mailing Address[address] [place], [place] [address] Physical Address[address] [place], [place] [address] Toll Free Main # [phone] Fax # [phone] Cell # [phone] [email or web address]

## 2022-07-27 NOTE — Patient Outreach (Signed)
Care Coordination   Initial Visit Note   07/27/2022  Name: Shelley [name] MRN: [id number] DOB: Dec 10, 1955  Shelley [name] is a 67 y.o. year old female who sees [name], [place] for primary care. I spoke with Estell Harpin by phone today.  What matters to the patients health and wellness today? No Interventions Identified.  Patient denies need for social work involvement at this time.   Goals Addressed             This Visit's Progress    Assess Need for Social Work Involvement.   On track    Care Coordination Interventions:  Interventions Today    Flowsheet Row Most Recent Value  Chronic Disease   Chronic disease during today's visit Hypertension (HTN), Other  [Mixed Hyperlipidemia]  General Interventions   General Interventions Discussed/Reviewed General Interventions Discussed, Labs, Vaccines, Doctor Visits, General Interventions Reviewed, Annual Eye Exam, Durable Medical Equipment (DME), [employer], Level of Care, Communication with, Health Screening  [Encouraged]  Labs Hgb A1c annually  [Encouraged]  Vaccines COVID-19, Tetanus/Pertussis/Diphtheria, Shingles, RSV, Pneumonia, Flu  [Encouraged]  Doctor Visits Discussed/Reviewed Doctor Visits Discussed, Doctor Visits Reviewed, Annual Wellness Visits, PCP  [Encouraged]  Health Screening Bone Density, Colonoscopy, Mammogram  [Encouraged]  Durable Medical Equipment (DME) BP Cuff, Other  [Eyeglasses, Hand-Held Shower Hose]  PCP/Specialist Visits Compliance with follow-up visit  [Encouraged]  Communication with PCP/Specialists  [Encouraged]  Exercise Interventions   Exercise Discussed/Reviewed Exercise Discussed, Exercise Reviewed, Physical Activity  [Encouraged]  Physical Activity Discussed/Reviewed Physical Activity Discussed, Home Exercise Program (HEP), Physical Activity Reviewed, Types of exercise  [Encouraged]  Education Interventions   Education Provided Provided Education  Provided Verbal Education  On Nutrition, Mental Health/Coping with Illness, When to see the doctor, Eye Care, Applications, Exercise, Medication, Development worker, community, [employer]  Mental Health Interventions   Mental Health Discussed/Reviewed Mental Health Discussed, Mental Health Reviewed, Coping Strategies, Crisis, Other, Suicide, Substance Abuse, Grief and Loss, Depression, Anxiety  [Domestic Violence]  Nutrition Interventions   Nutrition Discussed/Reviewed Nutrition Discussed, Adding fruits and vegetables, Increaing proteins, Decreasing fats, Decreasing salt, Nutrition Reviewed, Fluid intake, Carbohydrate meal planning, Portion sizes, Decreasing sugar intake  [Encouraged]  Pharmacy Interventions   Pharmacy Dicussed/Reviewed Pharmacy Topics Discussed, Pharmacy Topics Reviewed, Medication Adherence, Affording Medications  [Encouraged]  Safety Interventions   Safety Discussed/Reviewed Safety Discussed, Safety Reviewed  [Encouraged]  Advanced Directive Interventions   Advanced Directives Discussed/Reviewed Advanced Directives Discussed  [Encouraged Completion]     Danford Bad, BSW, MSW, LCSW  Licensed Clinical Social Worker  [facility]  Mailing [place] [address], [place], [place] [address] Physical Address-[address], [place], [place] [address] Toll Free Main # [phone] Fax # [phone] Cell # [phone] [email or web address]        SDOH assessments and interventions completed:  Yes.  SDOH Interventions Today    Flowsheet Row Most Recent Value  SDOH Interventions   Food Insecurity Interventions Intervention Not Indicated  Housing Interventions Intervention Not Indicated  Transportation Interventions Intervention Not Indicated, Patient Resources (Friends/Family)  Utilities Interventions Intervention Not Indicated  Alcohol Usage Interventions Intervention Not Indicated (Score <7)  Financial Strain Interventions Intervention Not  Indicated  Physical Activity Interventions Patient Declined  Stress Interventions Intervention Not Indicated  Social Connections Interventions Intervention Not Indicated     Care Coordination Interventions:  No, not indicated.   Follow up plan: No further intervention required.   Encounter Outcome:  Pt. Visit Completed.   [employer], BSW,  MSW, LCSW  Licensed Clinical Social Financial planner [facility]  Mailing [place] [address] [place], [place] [address] Physical Address-[address], [place], [place] [address] Toll Free Main # [phone] Fax # [phone] Cell # [phone] [email or web address]

## 2022-09-28 DIAGNOSIS — E875 Hyperkalemia: Secondary | ICD-10-CM | POA: Diagnosis not present

## 2022-09-28 DIAGNOSIS — N281 Cyst of kidney, acquired: Secondary | ICD-10-CM | POA: Diagnosis not present

## 2022-09-28 DIAGNOSIS — E871 Hypo-osmolality and hyponatremia: Secondary | ICD-10-CM | POA: Diagnosis not present

## 2022-09-28 DIAGNOSIS — I10 Essential (primary) hypertension: Secondary | ICD-10-CM | POA: Diagnosis not present

## 2022-10-30 DIAGNOSIS — H40053 Ocular hypertension, bilateral: Secondary | ICD-10-CM | POA: Diagnosis not present

## 2022-11-20 DIAGNOSIS — H903 Sensorineural hearing loss, bilateral: Secondary | ICD-10-CM | POA: Diagnosis not present

## 2022-11-20 DIAGNOSIS — H9313 Tinnitus, bilateral: Secondary | ICD-10-CM | POA: Diagnosis not present

## 2022-11-30 ENCOUNTER — Ambulatory Visit (INDEPENDENT_AMBULATORY_CARE_PROVIDER_SITE_OTHER): Payer: BC Managed Care – PPO | Admitting: Nurse Practitioner

## 2022-11-30 ENCOUNTER — Other Ambulatory Visit (HOSPITAL_COMMUNITY)
Admission: RE | Admit: 2022-11-30 | Discharge: 2022-11-30 | Disposition: A | Discharge status: Home / Self Care | Payer: BC Managed Care – PPO | Source: Ambulatory Visit | Attending: Nurse Practitioner | Admitting: Nurse Practitioner

## 2022-11-30 ENCOUNTER — Encounter: Payer: Self-pay | Admitting: Nurse Practitioner

## 2022-11-30 VITALS — BP 132/74 | HR 61 | Ht 65.0 in | Wt 179.0 lb

## 2022-11-30 DIAGNOSIS — Z01419 Encounter for gynecological examination (general) (routine) without abnormal findings: Secondary | ICD-10-CM

## 2022-11-30 DIAGNOSIS — M8589 Other specified disorders of bone density and structure, multiple sites: Secondary | ICD-10-CM | POA: Diagnosis not present

## 2022-11-30 DIAGNOSIS — Z1231 Encounter for screening mammogram for malignant neoplasm of breast: Secondary | ICD-10-CM | POA: Diagnosis not present

## 2022-11-30 DIAGNOSIS — Z124 Encounter for screening for malignant neoplasm of cervix: Secondary | ICD-10-CM | POA: Insufficient documentation

## 2022-11-30 DIAGNOSIS — Z780 Asymptomatic menopausal state: Secondary | ICD-10-CM

## 2022-11-30 DIAGNOSIS — N951 Menopausal and female climacteric states: Secondary | ICD-10-CM | POA: Diagnosis not present

## 2022-11-30 MED ORDER — ESTRADIOL 0.1 MG/GM VA CREA: TOPICAL_CREAM | VAGINAL | 1 refills | Status: DC

## 2022-11-30 NOTE — Progress Notes (Signed)
Shelley [name] Dec 17, 1955 [id number]   History:  67 y.o. G2 P0011 presents for annual exam. Postmenopausal - no HRT. S/P 2003 supracervical hysterectomy with BSO for fibroids and endometriosis.  Using vaginal estrogen externally for dryness/atrophy 2-3 times weekly. Normal pap and mammogram history. Primary care manages hypertension, GERD, HLD.    Gynecologic History No LMP recorded. Patient has had a hysterectomy.   Contraception: status post hysterectomy Sexually active: No  Health Maintenace Last Pap: 08/28/2018. Results were: Normal neg HPV, 5-year repeat Last mammogram: 11/30/2022 (today). Results were: No report yet Last colonoscopy: 02/09/2020. Results were: Tubular adenoma, 5-year recall Last Dexa: 01/31/2022. Results were: T-score -1.6, FRAX 8.9% / 1.0%  Past medical history, past surgical history, family history and social history were all reviewed and documented in the EPIC chart. Works for [employer], over Field seismologist. Daughter working on PhD, teaching at [employer], baby girl due in January. Mother with history of breast cancer at age 61 and colon cancer at age 36.  ROS:  A ROS was performed and pertinent positives and negatives are included.  Exam:  Vitals:   11/30/22 0855  BP: 132/74  Pulse: 61  SpO2: 100%  Weight: 179 lb (81.2 kg)  Height: 5\' 5"  (1.651 m)      Body mass index is 29.79 kg/m.  General appearance:  Normal Thyroid:  Symmetrical, normal in size, without palpable masses or nodularity. Respiratory  Auscultation:  Clear without wheezing or rhonchi Cardiovascular  Auscultation:  Regular rate, without rubs, murmurs or gallops  Edema/varicosities:  Not grossly evident Abdominal  Soft,nontender, without masses, guarding or rebound.  Liver/spleen:  No organomegaly noted  Hernia:  None appreciated  Skin  Inspection:  Grossly normal   Breasts: Examined lying and sitting.   Right: Without masses, retractions, discharge or axillary adenopathy.   Left: Without  masses, retractions, discharge or axillary adenopathy. Pelvic: External genitalia:  no lesions              Urethra:  normal appearing urethra with no masses, tenderness or lesions              Bartholins and Skenes: normal                 Vagina: normal appearing vagina with normal color and discharge, no lesions. Atrophic changes              Cervix: no lesions Bimanual Exam:  Uterus: absent              Adnexa: no mass, fullness, tenderness              Rectovaginal: Deferred              Anus:  normal, no lesions  Patient informed chaperone available to be present for breast and pelvic exam. Patient has requested no chaperone to be present. Patient has been advised what will be completed during breast and pelvic exam.   Assessment/Plan:  67 y.o. G2 P0011 for annual exam.    Well female exam with routine gynecological exam - Education provided on SBEs, importance of preventative screenings, current guidelines, high calcium diet, regular exercise, and multivitamin daily. Labs through work.   Menopausal vaginal dryness - Plan: estradiol (ESTRACE VAGINAL) 0.1 MG/GM vaginal cream externally twice weekly. Good management.    Postmenopausal - No HRT. S/P 2003 supracervical TAH/BSO for fibroids and endometriosis. Denies menopausal symptoms.   Screening for cervical cancer - Plan: Cytology - PAP( [place]). Normal pap history. We discussed  option to stop screening if normal per guidelines and she is agreeable.   Osteopenia of multiple sites - T-score -1.6 without elevated FRAX. Vit D supplement, high calcium diet and regular exercise. Will repeat DXA next year.   Screening for breast cancer - Normal mammogram history.  Continue annual screenings.  Normal breast exam today. Mother with history of breast cancer.   Screening for colon cancer - 2021 colonoscopy. Will repeat at GI's recommended interval. Mother with history of colon cancer.   Follow up in 1 year for annual.      Olivia Mackie [facility], 9:23 AM 11/30/2022

## 2022-12-04 LAB — CYTOLOGY - PAP
Comment: NEGATIVE
Diagnosis: NEGATIVE
High risk HPV: NEGATIVE

## 2022-12-07 DIAGNOSIS — I10 Essential (primary) hypertension: Secondary | ICD-10-CM | POA: Diagnosis not present

## 2022-12-07 DIAGNOSIS — Q612 Polycystic kidney, adult type: Secondary | ICD-10-CM | POA: Diagnosis not present

## 2022-12-07 DIAGNOSIS — L408 Other psoriasis: Secondary | ICD-10-CM | POA: Diagnosis not present

## 2022-12-07 DIAGNOSIS — Z0001 Encounter for general adult medical examination with abnormal findings: Secondary | ICD-10-CM | POA: Diagnosis not present

## 2022-12-07 DIAGNOSIS — E871 Hypo-osmolality and hyponatremia: Secondary | ICD-10-CM | POA: Diagnosis not present

## 2022-12-07 DIAGNOSIS — Z1331 Encounter for screening for depression: Secondary | ICD-10-CM | POA: Diagnosis not present

## 2022-12-07 DIAGNOSIS — Z6828 Body mass index (BMI) 28.0-28.9, adult: Secondary | ICD-10-CM | POA: Diagnosis not present

## 2022-12-07 DIAGNOSIS — F17210 Nicotine dependence, cigarettes, uncomplicated: Secondary | ICD-10-CM | POA: Diagnosis not present

## 2022-12-07 DIAGNOSIS — E663 Overweight: Secondary | ICD-10-CM | POA: Diagnosis not present

## 2022-12-21 ENCOUNTER — Ambulatory Visit (INDEPENDENT_AMBULATORY_CARE_PROVIDER_SITE_OTHER): Payer: BC Managed Care – PPO | Admitting: Audiology

## 2022-12-21 DIAGNOSIS — E871 Hypo-osmolality and hyponatremia: Secondary | ICD-10-CM | POA: Diagnosis not present

## 2022-12-21 DIAGNOSIS — H903 Sensorineural hearing loss, bilateral: Secondary | ICD-10-CM

## 2022-12-21 DIAGNOSIS — Q612 Polycystic kidney, adult type: Secondary | ICD-10-CM | POA: Diagnosis not present

## 2022-12-21 DIAGNOSIS — Z0001 Encounter for general adult medical examination with abnormal findings: Secondary | ICD-10-CM | POA: Diagnosis not present

## 2022-12-21 NOTE — Progress Notes (Signed)
Patient was seen today to return her trial hearing aids. She stated that she noticed a difference when wearing them. She is interested in ordering a pair of Oticon Intent 3 devices in the color honey beige, with size 3, 60 power receivers and 8mm OpenBass domes. She is not interested in wearing the retention lines at this time. Will call patient once I have her hearing aids in the office. Instructed her to call if any concerns arise.  Conley Rolls Shelley [name], AUD, CCC-A 12/21/22

## 2022-12-27 ENCOUNTER — Ambulatory Visit (INDEPENDENT_AMBULATORY_CARE_PROVIDER_SITE_OTHER): Payer: Self-pay | Admitting: Audiology

## 2022-12-27 DIAGNOSIS — H903 Sensorineural hearing loss, bilateral: Secondary | ICD-10-CM

## 2022-12-27 NOTE — Progress Notes (Signed)
Patient was seen today for a fitting of her Oticon Intent 3 hearing aids. Fit her with size 6mm OpenBass domes. Set patient at around 70% overall gain. She stated that the balance, clarity, and volume were good. Reviewed maintenance and care for the devices. Connected the hearing aids to her TV adapter, phone Bluetooth, and [employer]. Will call patient before her 30 day trial ends and instructed her to call if any concerns arise.   Shelley [name] Shelley [name], AUD, CCC-A 12/27/22

## 2022-12-28 DIAGNOSIS — E871 Hypo-osmolality and hyponatremia: Secondary | ICD-10-CM | POA: Diagnosis not present

## 2022-12-28 DIAGNOSIS — E559 Vitamin D deficiency, unspecified: Secondary | ICD-10-CM | POA: Diagnosis not present

## 2022-12-29 ENCOUNTER — Other Ambulatory Visit (HOSPITAL_COMMUNITY): Payer: Self-pay | Admitting: Nephrology

## 2022-12-29 DIAGNOSIS — N281 Cyst of kidney, acquired: Secondary | ICD-10-CM

## 2023-01-04 DIAGNOSIS — N281 Cyst of kidney, acquired: Secondary | ICD-10-CM | POA: Diagnosis not present

## 2023-01-04 DIAGNOSIS — E875 Hyperkalemia: Secondary | ICD-10-CM | POA: Diagnosis not present

## 2023-01-04 DIAGNOSIS — E871 Hypo-osmolality and hyponatremia: Secondary | ICD-10-CM | POA: Diagnosis not present

## 2023-01-04 DIAGNOSIS — I10 Essential (primary) hypertension: Secondary | ICD-10-CM | POA: Diagnosis not present

## 2023-01-05 DIAGNOSIS — D649 Anemia, unspecified: Secondary | ICD-10-CM | POA: Diagnosis not present

## 2023-01-08 DIAGNOSIS — Z23 Encounter for immunization: Secondary | ICD-10-CM | POA: Diagnosis not present

## 2023-01-11 ENCOUNTER — Encounter (HOSPITAL_COMMUNITY): Payer: Self-pay

## 2023-01-11 ENCOUNTER — Emergency Department (HOSPITAL_COMMUNITY): Payer: BC Managed Care – PPO

## 2023-01-11 ENCOUNTER — Emergency Department (HOSPITAL_COMMUNITY)
Admission: EM | Admit: 2023-01-11 | Discharge: 2023-01-12 | Disposition: A | Discharge status: Home / Self Care | Payer: BC Managed Care – PPO | Attending: Emergency Medicine | Admitting: Emergency Medicine

## 2023-01-11 ENCOUNTER — Other Ambulatory Visit: Payer: Self-pay

## 2023-01-11 DIAGNOSIS — Z91040 Latex allergy status: Secondary | ICD-10-CM | POA: Insufficient documentation

## 2023-01-11 DIAGNOSIS — W01198A Fall on same level from slipping, tripping and stumbling with subsequent striking against other object, initial encounter: Secondary | ICD-10-CM | POA: Insufficient documentation

## 2023-01-11 DIAGNOSIS — M25552 Pain in left hip: Secondary | ICD-10-CM | POA: Diagnosis not present

## 2023-01-11 DIAGNOSIS — E119 Type 2 diabetes mellitus without complications: Secondary | ICD-10-CM | POA: Diagnosis not present

## 2023-01-11 DIAGNOSIS — Z7982 Long term (current) use of aspirin: Secondary | ICD-10-CM | POA: Diagnosis not present

## 2023-01-11 DIAGNOSIS — M47816 Spondylosis without myelopathy or radiculopathy, lumbar region: Secondary | ICD-10-CM | POA: Diagnosis not present

## 2023-01-11 DIAGNOSIS — I10 Essential (primary) hypertension: Secondary | ICD-10-CM | POA: Diagnosis not present

## 2023-01-11 DIAGNOSIS — J449 Chronic obstructive pulmonary disease, unspecified: Secondary | ICD-10-CM | POA: Insufficient documentation

## 2023-01-11 DIAGNOSIS — Z7984 Long term (current) use of oral hypoglycemic drugs: Secondary | ICD-10-CM | POA: Insufficient documentation

## 2023-01-11 DIAGNOSIS — Z79899 Other long term (current) drug therapy: Secondary | ICD-10-CM | POA: Insufficient documentation

## 2023-01-11 DIAGNOSIS — S300XXA Contusion of lower back and pelvis, initial encounter: Secondary | ICD-10-CM | POA: Diagnosis not present

## 2023-01-11 DIAGNOSIS — M898X8 Other specified disorders of bone, other site: Secondary | ICD-10-CM | POA: Diagnosis not present

## 2023-01-11 DIAGNOSIS — S79912A Unspecified injury of left hip, initial encounter: Secondary | ICD-10-CM | POA: Diagnosis not present

## 2023-01-11 DIAGNOSIS — M1612 Unilateral primary osteoarthritis, left hip: Secondary | ICD-10-CM | POA: Diagnosis not present

## 2023-01-11 MED ORDER — HYDROCODONE-ACETAMINOPHEN 5-325 MG PO TABS
2.0000 | ORAL_TABLET | Freq: Once | ORAL | Status: AC
  Administered 2023-01-11: 2 via ORAL
  Filled 2023-01-11: qty 2

## 2023-01-11 NOTE — ED Triage Notes (Signed)
Pt c/o of fall tonight after tripping while taking her clothes off. Pt fell on left hip and is c/o of hip pain. No blood thinners, no LOC. Pt ambulatory, but slow.

## 2023-01-11 NOTE — ED Provider Notes (Signed)
[place] EMERGENCY DEPARTMENT AT [facility] Provider Note   CSN: [id number] Arrival date & time: 01/11/23  1926     History  Chief Complaint  Patient presents with   Fall   Hip Pain    Shelley [name] is a 67 y.o. female.  Patient is a 67 year old female with past medical history of type 2 diabetes, hypertension, COPD, hyperlipidemia, GERD.  Patient presenting today for evaluation of left hip pain.  Patient was getting dressed this evening when she lost her balance and fell.  She reports striking her hip on the nightstand.  She is now having swelling and severe pain to the back of her left hip and buttock area.  Patient describes severe pain that is worse when she attempts to ambulate.  She denies other injury.  No alleviating factors.  The history is provided by the patient.       Home Medications Prior to Admission medications   Medication Sig Start Date End Date Taking? Authorizing Provider  aspirin EC 81 MG tablet Take 81 mg by mouth at bedtime.    [provider]  bisoprolol-hydrochlorothiazide (ZIAC) 5-6.25 MG tablet Take 1 tablet by mouth 2 (two) times daily. Pt taking two times daily.    [provider]  Cholecalciferol (VITAMIN D) 125 MCG (5000 UT) CAPS Take by mouth.    [provider]  diltiazem (CARDIZEM CD) 360 MG 24 hr capsule Take 1 capsule (360 mg total) by mouth daily. 03/03/22   Creig Hines, NP  estradiol [facility] VAGINAL) 0.1 MG/GM vaginal cream Insert 1 gram vaginally twice weekly 11/30/22   Wyline Beady A, NP  fenofibrate 160 MG tablet Take 160 mg by mouth every morning. 08/22/13   [provider]  lansoprazole (PREVACID) 30 MG capsule Take 30 mg by mouth daily at 12 noon.    [provider]  losartan (COZAAR) 100 MG tablet Take 100 mg by mouth daily. 01/23/22   [provider]  LUMIGAN 0.01 % SOLN Place 1 drop into both eyes at bedtime. 08/22/19   [provider]   metFORMIN (GLUCOPHAGE-XR) 500 MG 24 hr tablet Take 500 mg by mouth 2 (two) times daily with a meal. 08/15/21   [provider]  montelukast (SINGULAIR) 10 MG tablet Take 10 mg by mouth every morning.  11/10/11   [provider]  Multiple Vitamin (MULTIVITAMIN) tablet Take 1 tablet by mouth daily.    [provider]  rosuvastatin (CRESTOR) 40 MG tablet Take 20 mg by mouth every evening.     [provider]      Allergies    Albuterol, Codeine, Icosapent ethyl, Latex, Morphine, Predicort [prednisolone], and Penicillins    Review of Systems   Review of Systems  All other systems reviewed and are negative.   Physical Exam Updated Vital Signs BP (!) 164/81 (BP Location: Right Arm)   Pulse 74   Temp 98.3 F (36.8 C) (Oral)   Resp 20   Wt 81.6 kg   SpO2 99%   BMI 29.95 kg/m  Physical Exam Vitals and nursing note reviewed.  Constitutional:      Appearance: Normal appearance.  Pulmonary:     Effort: Pulmonary effort is normal.  Musculoskeletal:     Comments: There is swelling and tenderness noted to the lateral aspect of the left buttock.  Patient has good range of motion, but with some discomfort.  DP pulses are palpable and motor and sensation are intact throughout the entire  foot.  Skin:    General: Skin is dry.  Neurological:     Mental Status: She is alert.     ED Results / Procedures / Treatments   Labs (all labs ordered are listed, but only abnormal results are displayed) Labs Reviewed - No data to display  EKG None  Radiology DG Hip Unilat With Pelvis 2-3 Views Left  Result Date: 01/11/2023 CLINICAL DATA:  Hip pain on the left after fall EXAM: DG HIP (WITH OR WITHOUT PELVIS) 2-3V LEFT COMPARISON:  None Available. FINDINGS: There is no evidence of hip fracture or dislocation. Degenerative changes pubic symphysis, both hips, SI joints and lower lumbar spine. IMPRESSION: No acute fracture or dislocation. Electronically Signed   By:  Minerva Fester M.D.   On: 01/11/2023 22:01    Procedures Procedures   Medications Ordered in ED Medications  HYDROcodone-acetaminophen (NORCO/VICODIN) 5-325 MG per tablet 2 tablet (has no administration in time range)    ED Course/ Medical Decision Making/ A&P  Patient is a 67 year old female presenting with a left hip injury as described in the HPI.  She has swelling and tenderness overlying the left buttock.  Patient's initial x-rays are negative for fracture, but due to the degree of discomfort and swelling to the buttock, a CT scan was obtained.  This reveals a 6.6 x 6.1 x 3.7 cm hematoma within the left gluteus maximus.  Radiology suggest that this may predispose the patient to gluteal compartment syndrome.  This was discussed with Dr. Carola Frost from orthopedics who does not feel as though any emergent intervention is indicated.  Patient will be discharged with pain medication, rest, and orthopedic follow-up.  Final Clinical Impression(s) / ED Diagnoses Final diagnoses:  None    Rx / DC Orders ED Discharge Orders     None         Geoffery Lyons, MD 01/12/23 [phone]

## 2023-01-11 NOTE — ED Notes (Signed)
Patient has swollen area about 7 or 8 cm in diameter.

## 2023-01-12 DIAGNOSIS — M1612 Unilateral primary osteoarthritis, left hip: Secondary | ICD-10-CM | POA: Diagnosis not present

## 2023-01-12 DIAGNOSIS — M898X8 Other specified disorders of bone, other site: Secondary | ICD-10-CM | POA: Diagnosis not present

## 2023-01-12 DIAGNOSIS — S79912A Unspecified injury of left hip, initial encounter: Secondary | ICD-10-CM | POA: Diagnosis not present

## 2023-01-12 DIAGNOSIS — S300XXA Contusion of lower back and pelvis, initial encounter: Secondary | ICD-10-CM | POA: Diagnosis not present

## 2023-01-12 MED ORDER — HYDROCODONE-ACETAMINOPHEN 5-325 MG PO TABS: 1.0000 | ORAL_TABLET | Freq: Four times a day (QID) | ORAL | 0 refills | Status: DC | PRN

## 2023-01-12 MED ORDER — HYDROCODONE-ACETAMINOPHEN 5-325 MG PO TABS: 2.0000 | ORAL_TABLET | ORAL | 0 refills | Status: DC | PRN

## 2023-01-12 NOTE — Discharge Instructions (Signed)
Begin taking hydrocodone as prescribed as needed for pain.  Rest.  Follow-up with orthopedics next week.  The contact information for Dr. Romeo Apple has been provided in this discharge summary for you to call and make these arrangements.

## 2023-01-14 MED FILL — Hydrocodone-Acetaminophen Tab 5-325 MG: ORAL | Qty: 6 | Status: AC

## 2023-01-18 ENCOUNTER — Ambulatory Visit (HOSPITAL_COMMUNITY)
Admission: RE | Admit: 2023-01-18 | Discharge: 2023-01-18 | Disposition: A | Discharge status: Home / Self Care | Payer: BC Managed Care – PPO | Source: Ambulatory Visit | Attending: Nephrology | Admitting: Nephrology

## 2023-01-18 DIAGNOSIS — N281 Cyst of kidney, acquired: Secondary | ICD-10-CM | POA: Insufficient documentation

## 2023-01-18 DIAGNOSIS — D649 Anemia, unspecified: Secondary | ICD-10-CM | POA: Diagnosis not present

## 2023-01-25 DIAGNOSIS — H40053 Ocular hypertension, bilateral: Secondary | ICD-10-CM | POA: Diagnosis not present

## 2023-01-25 DIAGNOSIS — Z1211 Encounter for screening for malignant neoplasm of colon: Secondary | ICD-10-CM | POA: Diagnosis not present

## 2023-02-15 ENCOUNTER — Other Ambulatory Visit: Payer: BC Managed Care – PPO

## 2023-02-15 ENCOUNTER — Encounter: Payer: Self-pay | Admitting: Oncology

## 2023-02-15 ENCOUNTER — Inpatient Hospital Stay: Payer: BC Managed Care – PPO | Admitting: Oncology

## 2023-02-15 ENCOUNTER — Inpatient Hospital Stay: Payer: BC Managed Care – PPO | Attending: Oncology | Admitting: Oncology

## 2023-02-15 VITALS — BP 163/64 | HR 85 | Temp 99.0°F | Resp 17 | Wt 182.0 lb

## 2023-02-15 DIAGNOSIS — Z880 Allergy status to penicillin: Secondary | ICD-10-CM | POA: Insufficient documentation

## 2023-02-15 DIAGNOSIS — E119 Type 2 diabetes mellitus without complications: Secondary | ICD-10-CM | POA: Insufficient documentation

## 2023-02-15 DIAGNOSIS — Z885 Allergy status to narcotic agent status: Secondary | ICD-10-CM | POA: Diagnosis not present

## 2023-02-15 DIAGNOSIS — Z800 Family history of malignant neoplasm of digestive organs: Secondary | ICD-10-CM | POA: Diagnosis not present

## 2023-02-15 DIAGNOSIS — Z79899 Other long term (current) drug therapy: Secondary | ICD-10-CM | POA: Insufficient documentation

## 2023-02-15 DIAGNOSIS — Z803 Family history of malignant neoplasm of breast: Secondary | ICD-10-CM | POA: Insufficient documentation

## 2023-02-15 DIAGNOSIS — I10 Essential (primary) hypertension: Secondary | ICD-10-CM | POA: Insufficient documentation

## 2023-02-15 DIAGNOSIS — D509 Iron deficiency anemia, unspecified: Secondary | ICD-10-CM | POA: Insufficient documentation

## 2023-02-15 DIAGNOSIS — D508 Other iron deficiency anemias: Secondary | ICD-10-CM

## 2023-02-15 MED ORDER — FERROUS SULFATE 325 (65 FE) MG PO TBEC: 325.0000 mg | DELAYED_RELEASE_TABLET | ORAL | 3 refills | Status: DC

## 2023-02-15 NOTE — Patient Instructions (Signed)
VISIT SUMMARY:  During today's visit, we discussed your recent fall and the resulting hematoma, which has contributed to your iron deficiency anemia. We also reviewed your diabetes management, hypertension, and occasional cardiac arrhythmia. You reported no current complaints or symptoms, and we have outlined a plan to address your anemia and continue managing your other conditions.  YOUR PLAN:  -IRON DEFICIENCY ANEMIA: Iron deficiency anemia occurs when your body doesn't have enough iron to produce adequate levels of hemoglobin, which is necessary for carrying oxygen in your blood. The recent large hematoma from your fall likely contributed to this condition. We will start with two IV iron infusions, one week apart, and you will also take oral iron pills every other day. If you experience constipation, you can use Miralax. We will check your labs in six weeks to see how you are responding to the treatment.  INSTRUCTIONS:  Please follow up in six weeks for lab work to assess your response to the iron therapy. If your anemia does not improve, we will plan for more extensive blood work to check for other deficiencies.

## 2023-02-15 NOTE — Progress Notes (Signed)
[name] HEMATOLOGY NEW VISIT  [name], PA-C  REASON FOR REFERRAL: Iron deficiency anemia   HISTORY OF PRESENT ILLNESS: Shelley [name] 67 y.o. female referred for iron deficiency anemia.  Patient has a past medical history of hypertension, diabetes, PVCs.She reports a recent large hematoma following a fall, which resulted in a 'blood blister under the muscle' that was 'about the size of a plate.' The hematoma ruptured three days post-accident, causing extensive bruising. She denies taking iron supplements between the initial and follow-up blood work that confirmed the iron deficiency. She also reports a history of anemia in early childhood, which required regular blood draws for the first five or six years of life.  The patient denies current complaints, shortness of breath, or fatigue.  She has no complaints today and has been doing very well.  She denies blood in stools, dark-colored stools, constipation, abdominal pain, recent weight loss, loss of appetite.  She gets her colonoscopies every 5 years.  She works full time and helps care for her elderly father. She denies smoking or drinking alcohol.  History of colon cancer breast cancer in mother in her 27s  I have reviewed the past medical history, past surgical history, social history and family history with the patient   ALLERGIES:  is allergic to albuterol, codeine, icosapent ethyl (epa ethyl ester) (fish), latex, morphine, predicort [prednisolone], and penicillins.  MEDICATIONS:  Current Outpatient Medications  Medication Sig Dispense Refill   amitriptyline (ELAVIL) 25 MG tablet Take by mouth.     aspirin EC 81 MG tablet Take 81 mg by mouth at bedtime.     bisoprolol (ZEBETA) 5 MG tablet Take 5 mg by mouth 2 (two) times daily.     Cholecalciferol (VITAMIN D) 125 MCG (5000 UT) CAPS Take by mouth.     diltiazem (CARDIZEM CD) 360 MG 24 hr capsule Take 1 capsule (360 mg total) by  mouth daily. 90 capsule 3   estradiol (ESTRACE VAGINAL) 0.1 MG/GM vaginal cream Insert 1 gram vaginally twice weekly 42.5 g 1   fenofibrate 160 MG tablet Take 160 mg by mouth every morning.     ferrous sulfate 325 (65 FE) MG EC tablet Take 1 tablet (325 mg total) by mouth every other day. 45 tablet 3   hydrALAZINE (APRESOLINE) 25 MG tablet Take 25 mg by mouth 2 (two) times daily.     HYDROcodone-acetaminophen (NORCO) 5-325 MG tablet Take 1-2 tablets by mouth every 6 (six) hours as needed. 15 tablet 0   HYDROcodone-acetaminophen (NORCO) 5-325 MG tablet Take 2 tablets by mouth every 4 (four) hours as needed. 6 tablet 0   lansoprazole (PREVACID) 30 MG capsule Take 30 mg by mouth daily at 12 noon.     LUMIGAN 0.01 % SOLN Place 1 drop into both eyes at bedtime.     metFORMIN (GLUCOPHAGE-XR) 500 MG 24 hr tablet Take 500 mg by mouth 2 (two) times daily with a meal.     montelukast (SINGULAIR) 10 MG tablet Take 10 mg by mouth every morning.      Multiple Vitamin (MULTIVITAMIN) tablet Take 1 tablet by mouth daily.     losartan (COZAAR) 50 MG tablet Take 50 mg by mouth daily.     melatonin 5 MG TABS 1 tablet in the evening Orally Once a day     polyethylene glycol powder (MIRALAX) 17 GM/SCOOP powder 1 scoop mixed with 8 ounces of fluid Orally Once a day  rosuvastatin (CRESTOR) 20 MG tablet Take 20 mg by mouth at bedtime.     Current Facility-Administered Medications  Medication Dose Route Frequency Provider Last Rate Last Admin   0.9 %  sodium chloride infusion  500 mL Intravenous Once Tressia Danas, MD         REVIEW OF SYSTEMS:   Constitutional: Denies fevers, chills or night sweats Eyes: Denies blurriness of vision Ears, nose, mouth, throat, and face: Denies mucositis or sore throat Respiratory: Denies cough, dyspnea or wheezes Cardiovascular: Denies palpitation, chest discomfort or lower extremity swelling Gastrointestinal:  Denies nausea, heartburn or change in bowel habits Skin:  Denies abnormal skin rashes Lymphatics: Denies new lymphadenopathy or easy bruising Neurological:Denies numbness, tingling or new weaknesses Behavioral/Psych: Mood is stable, no new changes  All other systems were reviewed with the patient and are negative.  PHYSICAL EXAMINATION:   Vitals:   02/15/23 0824  BP: (!) 163/64  Pulse: 85  Resp: 17  Temp: 99 F (37.2 C)  SpO2: 99%    GENERAL:alert, no distress and comfortable SKIN: skin color, texture, turgor are normal, no rashes or significant lesions LUNGS: clear to auscultation and percussion with normal breathing effort HEART: regular rate & rhythm and no murmurs and no lower extremity edema ABDOMEN:abdomen soft, non-tender and normal bowel sounds Musculoskeletal:no cyanosis of digits and no clubbing  NEURO: alert & oriented x 3 with fluent speech  LABORATORY DATA:  I have reviewed the data as listed in labs from primary care under media  Labs from 01/19/2023: CBC: WBC: 5.7, hemoglobin: 8.5, hematocrit: 28.7, MCV: 76, platelets: 546 Iron: 27, percent sat: 5, TIBC: 579   Labs from 01/06/2023: Iron saturation: 5 iron: 27, TIBC: 579 CBC: WBC: 8.7, hemoglobin: 10, hematocrit: 33.3, MCV: 74, platelets: 563 CMP: Calcium: 10.2, creatinine: 0.74, GFR: 90, LFTs: WNL Cologuard testing on 01/25/2023: Negative  Lab Results  Component Value Date   WBC 8.7 03/16/2022   NEUTROABS 6.4 03/16/2022   HGB 13.3 03/16/2022   HCT 39.8 03/16/2022   MCV 85.8 03/16/2022   PLT 444 (H) 03/16/2022      Component Value Date/Time   NA 134 04/04/2022 1336   K 5.1 04/04/2022 1336   CL 93 (L) 04/04/2022 1336   CO2 21 04/04/2022 1336   GLUCOSE 85 04/04/2022 1336   GLUCOSE 135 (H) 03/23/2022 1451   BUN 16 04/04/2022 1336   CREATININE 0.90 04/04/2022 1336   CALCIUM 10.6 (H) 04/04/2022 1336   PROT 7.5 03/16/2022 1446   ALBUMIN 4.6 03/16/2022 1446   AST 27 03/16/2022 1446   ALT 24 03/16/2022 1446   ALKPHOS 30 (L) 03/16/2022 1446   BILITOT  0.6 03/16/2022 1446   GFRNONAA >60 03/23/2022 1451   GFRAA >90 10/28/2013 0253        Chemistry      Component Value Date/Time   NA 134 04/04/2022 1336   K 5.1 04/04/2022 1336   CL 93 (L) 04/04/2022 1336   CO2 21 04/04/2022 1336   BUN 16 04/04/2022 1336   CREATININE 0.90 04/04/2022 1336      Component Value Date/Time   CALCIUM 10.6 (H) 04/04/2022 1336   ALKPHOS 30 (L) 03/16/2022 1446   AST 27 03/16/2022 1446   ALT 24 03/16/2022 1446   BILITOT 0.6 03/16/2022 1446      ASSESSMENT & PLAN:  Is a 67 year old female for iron deficiency anemia secondary to an acute bleed   IDA (iron deficiency anemia) Recent significant hematoma following a fall, likely contributing to  anemia. Iron levels are significantly low with TSAT of 7. No current bleeding noted. History of anemia in early life, possible pernicious anemia in the past. -The most likely cause of her anemia is due to acute blood loss.We discussed some of the risks, benefits, and alternatives of intravenous iron infusions. The patient is symptomatic from anemia and the iron level is critically low. She desires to achieve higher levels of iron faster for adequate hematopoesis. Some of the side-effects to be expected including risks of infusion reactions, phlebitis, headaches, nausea and fatigue.  The patient is willing to proceed. Patient education material was dispensed. Goal is to keep ferritin level greater than 50 and resolution of anemia  -Prescribe oral iron pills, to be taken every other day. If constipation occurs, advise use of Miralax. -Next colonoscopy due in 2026 -Check labs in six weeks to assess response to iron therapy.   Orders Placed This Encounter  Procedures   Ferritin    Standing Status:   Future    Standing Expiration Date:   02/15/2024   Folate    Standing Status:   Future    Standing Expiration Date:   02/15/2024   Vitamin B12    Standing Status:   Future    Standing Expiration Date:   02/15/2024    Comprehensive metabolic panel    Standing Status:   Future    Standing Expiration Date:   02/15/2024   CBC with Differential/Platelet    Standing Status:   Future    Standing Expiration Date:   02/15/2024   Iron and TIBC    Standing Status:   Future    Standing Expiration Date:   02/15/2024    Order Specific Question:   Release to patient    Answer:   Immediate [1]    The total time spent in the appointment was 30 minutes encounter with patients including review of chart and various tests results, discussions about plan of care and coordination of care plan   All questions were answered. The patient knows to call the clinic with any problems, questions or concerns. No barriers to learning was detected.   Cindie Crumbly, MD 12/5/20249:55 AM

## 2023-02-15 NOTE — Assessment & Plan Note (Signed)
Recent significant hematoma following a fall, likely contributing to anemia. Iron levels are significantly low with TSAT of 7. No current bleeding noted. History of anemia in early life, possible pernicious anemia in the past. -The most likely cause of her anemia is due to acute blood loss.We discussed some of the risks, benefits, and alternatives of intravenous iron infusions. The patient is symptomatic from anemia and the iron level is critically low. She desires to achieve higher levels of iron faster for adequate hematopoesis. Some of the side-effects to be expected including risks of infusion reactions, phlebitis, headaches, nausea and fatigue.  The patient is willing to proceed. Patient education material was dispensed. Goal is to keep ferritin level greater than 50 and resolution of anemia  -Prescribe oral iron pills, to be taken every other day. If constipation occurs, advise use of Miralax. -Next colonoscopy due in 2026 -Check labs in six weeks to assess response to iron therapy.

## 2023-03-01 ENCOUNTER — Inpatient Hospital Stay: Payer: BC Managed Care – PPO

## 2023-03-02 ENCOUNTER — Inpatient Hospital Stay: Payer: BC Managed Care – PPO

## 2023-03-09 ENCOUNTER — Inpatient Hospital Stay: Payer: BC Managed Care – PPO

## 2023-03-09 VITALS — BP 139/54 | HR 69 | Temp 97.6°F | Resp 18

## 2023-03-09 DIAGNOSIS — D509 Iron deficiency anemia, unspecified: Secondary | ICD-10-CM | POA: Diagnosis not present

## 2023-03-09 DIAGNOSIS — Z803 Family history of malignant neoplasm of breast: Secondary | ICD-10-CM | POA: Diagnosis not present

## 2023-03-09 DIAGNOSIS — Z880 Allergy status to penicillin: Secondary | ICD-10-CM | POA: Diagnosis not present

## 2023-03-09 DIAGNOSIS — E119 Type 2 diabetes mellitus without complications: Secondary | ICD-10-CM | POA: Diagnosis not present

## 2023-03-09 DIAGNOSIS — Z885 Allergy status to narcotic agent status: Secondary | ICD-10-CM | POA: Diagnosis not present

## 2023-03-09 DIAGNOSIS — Z800 Family history of malignant neoplasm of digestive organs: Secondary | ICD-10-CM | POA: Diagnosis not present

## 2023-03-09 DIAGNOSIS — D508 Other iron deficiency anemias: Secondary | ICD-10-CM

## 2023-03-09 DIAGNOSIS — I10 Essential (primary) hypertension: Secondary | ICD-10-CM | POA: Diagnosis not present

## 2023-03-09 DIAGNOSIS — Z79899 Other long term (current) drug therapy: Secondary | ICD-10-CM | POA: Diagnosis not present

## 2023-03-09 MED ORDER — SODIUM CHLORIDE 0.9 % IV SOLN
INTRAVENOUS | Status: DC
Start: 2023-03-09 — End: 2023-03-09

## 2023-03-09 MED ORDER — SODIUM CHLORIDE 0.9 % IV SOLN
510.0000 mg | Freq: Once | INTRAVENOUS | Status: AC
  Administered 2023-03-09: 510 mg via INTRAVENOUS
  Filled 2023-03-09: qty 510

## 2023-03-09 MED ORDER — ACETAMINOPHEN 325 MG PO TABS
650.0000 mg | ORAL_TABLET | Freq: Once | ORAL | Status: AC
  Administered 2023-03-09: 650 mg via ORAL
  Filled 2023-03-09: qty 2

## 2023-03-09 MED ORDER — CETIRIZINE HCL 10 MG PO TABS
10.0000 mg | ORAL_TABLET | Freq: Once | ORAL | Status: AC
  Administered 2023-03-09: 10 mg via ORAL
  Filled 2023-03-09: qty 1

## 2023-03-09 NOTE — Progress Notes (Signed)
Patient tolerated iron infusion with no complaints voiced.  Peripheral IV site clean and dry with good blood return noted before and after infusion. Pt observed for 30 minutes post iron  infusion per protocol without any complications.  Band aid applied.  VSS with discharge and left in satisfactory condition with no s/s of distress noted. RN educated pt on the importance of notifying the clinic if any complications occur, pt verbalized understanding. All follow ups as scheduled.   Shelley [name] [employer]

## 2023-03-09 NOTE — Patient Instructions (Signed)
 Ferumoxytol Injection What is this medication? FERUMOXYTOL (FER ue MOX i tol) treats low levels of iron in your body (iron deficiency anemia). Iron is a mineral that plays an important role in making red blood cells, which carry oxygen from your lungs to the rest of your body. This medicine may be used for other purposes; ask your health care provider or pharmacist if you have questions. COMMON BRAND NAME(S): Feraheme What should I tell my care team before I take this medication? They need to know if you have any of these conditions: Anemia not caused by low iron levels High levels of iron in the blood Magnetic resonance imaging (MRI) test scheduled An unusual or allergic reaction to iron, other medications, foods, dyes, or preservatives Pregnant or trying to get pregnant Breastfeeding How should I use this medication? This medication is injected into a vein. It is given by your care team in a hospital or clinic setting. Talk to your care team the use of this medication in children. Special care may be needed. Overdosage: If you think you have taken too much of this medicine contact a poison control center or emergency room at once. NOTE: This medicine is only for you. Do not share this medicine with others. What if I miss a dose? It is important not to miss your dose. Call your care team if you are unable to keep an appointment. What may interact with this medication? Other iron products This list may not describe all possible interactions. Give your health care provider a list of all the medicines, herbs, non-prescription drugs, or dietary supplements you use. Also tell them if you smoke, drink alcohol, or use illegal drugs. Some items may interact with your medicine. What should I watch for while using this medication? Visit your care team regularly. Tell your care team if your symptoms do not start to get better or if they get worse. You may need blood work done while you are taking this  medication. You may need to follow a special diet. Talk to your care team. Foods that contain iron include: whole grains/cereals, dried fruits, beans, or peas, leafy green vegetables, and organ meats (liver, kidney). What side effects may I notice from receiving this medication? Side effects that you should report to your care team as soon as possible: Allergic reactions--skin rash, itching, hives, swelling of the face, lips, tongue, or throat Low blood pressure--dizziness, feeling faint or lightheaded, blurry vision Shortness of breath Side effects that usually do not require medical attention (report to your care team if they continue or are bothersome): Flushing Headache Joint pain Muscle pain Nausea Pain, redness, or irritation at injection site This list may not describe all possible side effects. Call your doctor for medical advice about side effects. You may report side effects to FDA at 1-800-FDA-1088. Where should I keep my medication? This medication is given in a hospital or clinic. It will not be stored at home. NOTE: This sheet is a summary. It may not cover all possible information. If you have questions about this medicine, talk to your doctor, pharmacist, or health care provider.  2024 Elsevier/Gold Standard (2022-08-04 00:00:00)

## 2023-03-12 ENCOUNTER — Encounter: Payer: Self-pay | Admitting: Oncology

## 2023-03-16 ENCOUNTER — Inpatient Hospital Stay: Payer: BC Managed Care – PPO | Attending: Hematology

## 2023-03-16 VITALS — BP 143/69 | HR 65 | Temp 98.0°F | Resp 18

## 2023-03-16 DIAGNOSIS — R5383 Other fatigue: Secondary | ICD-10-CM | POA: Diagnosis not present

## 2023-03-16 DIAGNOSIS — D508 Other iron deficiency anemias: Secondary | ICD-10-CM

## 2023-03-16 DIAGNOSIS — Z885 Allergy status to narcotic agent status: Secondary | ICD-10-CM | POA: Insufficient documentation

## 2023-03-16 DIAGNOSIS — Z880 Allergy status to penicillin: Secondary | ICD-10-CM | POA: Insufficient documentation

## 2023-03-16 DIAGNOSIS — D510 Vitamin B12 deficiency anemia due to intrinsic factor deficiency: Secondary | ICD-10-CM | POA: Diagnosis not present

## 2023-03-16 DIAGNOSIS — Z79899 Other long term (current) drug therapy: Secondary | ICD-10-CM | POA: Diagnosis not present

## 2023-03-16 DIAGNOSIS — D509 Iron deficiency anemia, unspecified: Secondary | ICD-10-CM | POA: Diagnosis not present

## 2023-03-16 MED ORDER — SODIUM CHLORIDE 0.9 % IV SOLN: INTRAVENOUS | Status: DC

## 2023-03-16 MED ORDER — SODIUM CHLORIDE 0.9 % IV SOLN
510.0000 mg | Freq: Once | INTRAVENOUS | Status: AC
  Administered 2023-03-16: 510 mg via INTRAVENOUS
  Filled 2023-03-16: qty 510

## 2023-03-16 NOTE — Progress Notes (Signed)
 Feraheme given today per MD orders. Tolerated infusion without adverse affects. Vital signs stable. No complaints at this time. Discharged from clinic ambulatory in stable condition. Alert and oriented x 3. F/U with [facility] as scheduled.  

## 2023-03-16 NOTE — Progress Notes (Signed)
 Patient presents today for iron infusion. Patient is in satisfactory condition with no new complaints voiced.  Vital signs are stable.  We will proceed with infusion per provider orders.    Patient took pre-meds Tylenol  and Zyretc at home prior to arrival. Peripheral IV started with good blood return.

## 2023-03-16 NOTE — Patient Instructions (Signed)
 Ferumoxytol  Injection What is this medication? FERUMOXYTOL  (FER ue MOX i tol) treats low levels of iron in your body (iron deficiency anemia). Iron is a mineral that plays an important role in making red blood cells, which carry oxygen from your lungs to the rest of your body. This medicine may be used for other purposes; ask your health care provider or pharmacist if you have questions. COMMON BRAND NAME(S): Feraheme  What should I tell my care team before I take this medication? They need to know if you have any of these conditions: Anemia not caused by low iron levels High levels of iron in the blood Magnetic resonance imaging (MRI) test scheduled An unusual or allergic reaction to iron, other medications, foods, dyes, or preservatives Pregnant or trying to get pregnant Breastfeeding How should I use this medication? This medication is injected into a vein. It is given by your care team in a hospital or clinic setting. Talk to your care team the use of this medication in children. Special care may be needed. Overdosage: If you think you have taken too much of this medicine contact a poison control center or emergency room at once. NOTE: This medicine is only for you. Do not share this medicine with others. What if I miss a dose? It is important not to miss your dose. Call your care team if you are unable to keep an appointment. What may interact with this medication? Other iron products This list may not describe all possible interactions. Give your health care provider a list of all the medicines, herbs, non-prescription drugs, or dietary supplements you use. Also tell them if you smoke, drink alcohol, or use illegal drugs. Some items may interact with your medicine. What should I watch for while using this medication? Visit your care team regularly. Tell your care team if your symptoms do not start to get better or if they get worse. You may need blood work done while you are taking this  medication. You may need to follow a special diet. Talk to your care team. Foods that contain iron include: whole grains/cereals, dried fruits, beans, or peas, leafy green vegetables, and organ meats (liver, kidney). What side effects may I notice from receiving this medication? Side effects that you should report to your care team as soon as possible: Allergic reactions--skin rash, itching, hives, swelling of the face, lips, tongue, or throat Low blood pressure--dizziness, feeling faint or lightheaded, blurry vision Shortness of breath Side effects that usually do not require medical attention (report to your care team if they continue or are bothersome): Flushing Headache Joint pain Muscle pain Nausea Pain, redness, or irritation at injection site This list may not describe all possible side effects. Call your doctor for medical advice about side effects. You may report side effects to FDA at 1-800-FDA-1088. Where should I keep my medication? This medication is given in a hospital or clinic. It will not be stored at home. NOTE: This sheet is a summary. It may not cover all possible information. If you have questions about this medicine, talk to your doctor, pharmacist, or health care provider.  2024 Elsevier/Gold Standard (2022-08-04 00:00:00) [facility] [place] - A DEPT OF [facility]  Discharge Instructions: Thank you for choosing [facility] to provide your oncology and hematology care.  If you have a lab appointment with the Cancer Center - please note that after April 8th, 2024, all labs will be drawn in the cancer center.  You do not have to check in or register with the main entrance as you have in the past but will complete your check-in in the cancer center.  Wear comfortable clothing and clothing appropriate for easy access to any Portacath or PICC line.   We strive to give you quality time with your provider. You may need to reschedule  your appointment if you arrive late (15 or more minutes).  Arriving late affects you and other patients whose appointments are after yours.  Also, if you miss three or more appointments without notifying the office, you may be dismissed from the clinic at the provider's discretion.      For prescription refill requests, have your pharmacy contact our office and allow 72 hours for refills to be completed.    Today you received the following chemotherapy and/or immunotherapy agents Feraheme .       To help prevent nausea and vomiting after your treatment, we encourage you to take your nausea medication as directed.  BELOW ARE SYMPTOMS THAT SHOULD BE REPORTED IMMEDIATELY: *FEVER GREATER THAN 100.4 F (38 C) OR HIGHER *CHILLS OR SWEATING *NAUSEA AND VOMITING THAT IS NOT CONTROLLED WITH YOUR NAUSEA MEDICATION *UNUSUAL SHORTNESS OF BREATH *UNUSUAL BRUISING OR BLEEDING *URINARY PROBLEMS (pain or burning when urinating, or frequent urination) *BOWEL PROBLEMS (unusual diarrhea, constipation, pain near the anus) TENDERNESS IN MOUTH AND THROAT WITH OR WITHOUT PRESENCE OF ULCERS (sore throat, sores in mouth, or a toothache) UNUSUAL RASH, SWELLING OR PAIN  UNUSUAL VAGINAL DISCHARGE OR ITCHING   Items with * indicate a potential emergency and should be followed up as soon as possible or go to the Emergency Department if any problems should occur.  Please show the CHEMOTHERAPY ALERT CARD or IMMUNOTHERAPY ALERT CARD at check-in to the Emergency Department and triage nurse.  Should you have questions after your visit or need to cancel or reschedule your appointment, please contact [facility] [place] - A DEPT OF [facility] [phone]  and follow the prompts.  Office hours are 8:00 a.m. to 4:30 p.m. Monday - Friday. Please note that voicemails left after 4:00 p.m. may not be returned until the following business day.  We are closed weekends and major holidays. You have access to a  nurse at all times for urgent questions. Please call the main number to the clinic [phone] and follow the prompts.  For any non-urgent questions, you may also contact your provider using MyChart. We now offer e-Visits for anyone 52 and older to request care online for non-urgent symptoms. For details visit [email or web address].   Also download the MyChart app! Go to the app store, search MyChart, open the app, select [place], and log in with your MyChart username and password.

## 2023-03-19 ENCOUNTER — Encounter: Payer: Self-pay | Admitting: Oncology

## 2023-03-30 ENCOUNTER — Inpatient Hospital Stay: Payer: BC Managed Care – PPO

## 2023-03-30 DIAGNOSIS — D508 Other iron deficiency anemias: Secondary | ICD-10-CM

## 2023-03-30 DIAGNOSIS — R5383 Other fatigue: Secondary | ICD-10-CM | POA: Diagnosis not present

## 2023-03-30 DIAGNOSIS — Z880 Allergy status to penicillin: Secondary | ICD-10-CM | POA: Diagnosis not present

## 2023-03-30 DIAGNOSIS — D509 Iron deficiency anemia, unspecified: Secondary | ICD-10-CM | POA: Diagnosis not present

## 2023-03-30 DIAGNOSIS — Z79899 Other long term (current) drug therapy: Secondary | ICD-10-CM | POA: Diagnosis not present

## 2023-03-30 DIAGNOSIS — Z885 Allergy status to narcotic agent status: Secondary | ICD-10-CM | POA: Diagnosis not present

## 2023-03-30 DIAGNOSIS — D510 Vitamin B12 deficiency anemia due to intrinsic factor deficiency: Secondary | ICD-10-CM | POA: Diagnosis not present

## 2023-03-30 LAB — IRON AND TIBC
Iron: 86 ug/dL (ref 28–170)
Saturation Ratios: 19 % (ref 10.4–31.8)
TIBC: 462 ug/dL — ABNORMAL HIGH (ref 250–450)
UIBC: 376 ug/dL

## 2023-03-30 LAB — CBC WITH DIFFERENTIAL/PLATELET
Abs Immature Granulocytes: 0.01 10*3/uL (ref 0.00–0.07)
Basophils Absolute: 0 10*3/uL (ref 0.0–0.1)
Basophils Relative: 1 %
Eosinophils Absolute: 0.4 10*3/uL (ref 0.0–0.5)
Eosinophils Relative: 6 %
HCT: 41.8 % (ref 36.0–46.0)
Hemoglobin: 13.2 g/dL (ref 12.0–15.0)
Immature Granulocytes: 0 %
Lymphocytes Relative: 28 %
Lymphs Abs: 1.8 10*3/uL (ref 0.7–4.0)
MCH: 26.2 pg (ref 26.0–34.0)
MCHC: 31.6 g/dL (ref 30.0–36.0)
MCV: 82.9 fL (ref 80.0–100.0)
Monocytes Absolute: 0.7 10*3/uL (ref 0.1–1.0)
Monocytes Relative: 11 %
Neutro Abs: 3.4 10*3/uL (ref 1.7–7.7)
Neutrophils Relative %: 54 %
Platelets: 409 10*3/uL — ABNORMAL HIGH (ref 150–400)
RBC: 5.04 MIL/uL (ref 3.87–5.11)
RDW: 23.1 % — ABNORMAL HIGH (ref 11.5–15.5)
Smear Review: INCREASED
WBC: 6.3 10*3/uL (ref 4.0–10.5)
nRBC: 0 % (ref 0.0–0.2)

## 2023-03-30 LAB — COMPREHENSIVE METABOLIC PANEL
ALT: 25 U/L (ref 0–44)
AST: 26 U/L (ref 15–41)
Albumin: 4.4 g/dL (ref 3.5–5.0)
Alkaline Phosphatase: 34 U/L — ABNORMAL LOW (ref 38–126)
Anion gap: 7 (ref 5–15)
BUN: 17 mg/dL (ref 8–23)
CO2: 26 mmol/L (ref 22–32)
Calcium: 9.7 mg/dL (ref 8.9–10.3)
Chloride: 102 mmol/L (ref 98–111)
Creatinine, Ser: 0.82 mg/dL (ref 0.44–1.00)
GFR, Estimated: >60 mL/min (ref >60)
Glucose, Bld: 114 mg/dL — ABNORMAL HIGH (ref 70–99)
Potassium: 4.2 mmol/L (ref 3.5–5.1)
Sodium: 135 mmol/L (ref 135–145)
Total Bilirubin: 0.5 mg/dL (ref 0.0–1.2)
Total Protein: 7.3 g/dL (ref 6.5–8.1)

## 2023-03-30 LAB — FERRITIN: Ferritin: 200 ng/mL (ref 11–307)

## 2023-03-30 LAB — FOLATE: Folate: 9.9 ng/mL (ref >5.9)

## 2023-03-30 LAB — VITAMIN B12: Vitamin B-12: 173 pg/mL — ABNORMAL LOW (ref 180–914)

## 2023-04-06 ENCOUNTER — Inpatient Hospital Stay: Payer: BC Managed Care – PPO

## 2023-04-06 ENCOUNTER — Inpatient Hospital Stay: Payer: BC Managed Care – PPO | Admitting: Oncology

## 2023-04-06 VITALS — BP 151/73 | HR 71 | Temp 96.0°F | Resp 16 | Wt 177.6 lb

## 2023-04-06 DIAGNOSIS — D510 Vitamin B12 deficiency anemia due to intrinsic factor deficiency: Secondary | ICD-10-CM | POA: Insufficient documentation

## 2023-04-06 DIAGNOSIS — Z79899 Other long term (current) drug therapy: Secondary | ICD-10-CM | POA: Diagnosis not present

## 2023-04-06 DIAGNOSIS — Z885 Allergy status to narcotic agent status: Secondary | ICD-10-CM | POA: Diagnosis not present

## 2023-04-06 DIAGNOSIS — D509 Iron deficiency anemia, unspecified: Secondary | ICD-10-CM | POA: Diagnosis not present

## 2023-04-06 DIAGNOSIS — E538 Deficiency of other specified B group vitamins: Secondary | ICD-10-CM

## 2023-04-06 DIAGNOSIS — R5383 Other fatigue: Secondary | ICD-10-CM | POA: Diagnosis not present

## 2023-04-06 DIAGNOSIS — D508 Other iron deficiency anemias: Secondary | ICD-10-CM | POA: Diagnosis not present

## 2023-04-06 DIAGNOSIS — Z880 Allergy status to penicillin: Secondary | ICD-10-CM | POA: Diagnosis not present

## 2023-04-06 LAB — VITAMIN B12: Vitamin B-12: 152 pg/mL — ABNORMAL LOW (ref 180–914)

## 2023-04-06 NOTE — Assessment & Plan Note (Addendum)
Patient is s/p IV iron with significant improvement in iron levels.  Patient reports significant improvement in her fatigue and brain fog.  Likely secondary to malabsorption-Check labs in six weeks to assess response to iron therapy. -Continue oral iron supplementation every other day -Next colonoscopy due in 2026 -Repeat labs in 3 months and reassess for the need of IV iron

## 2023-04-06 NOTE — Progress Notes (Signed)
[facility] at [facility] HEMATOLOGY FOLLOW-UP VISIT  Shelley Epley, PA-C  REASON FOR FOLLOW-UP: Iron deficient send pernicious anemia  ASSESSMENT & PLAN:  Patient is a 68 year old female following for iron deficiency and pernicious anemia   IDA (iron deficiency anemia) Patient is s/p IV iron with significant improvement in iron levels.  Patient reports significant improvement in her fatigue and brain fog.  Likely secondary to malabsorption-Check labs in six weeks to assess response to iron therapy. -Continue oral iron supplementation every other day -Next colonoscopy due in 2026 -Repeat labs in 3 months and reassess for the need of IV iron   Pernicious anemia Low vitamin B12 level at 173.  Patient reported a history of probably pernicious anemia. -Will obtain intrinsic factor antibody  -Will start parenteral vitamin B12 supplementation -Repeat labs in 3 months   Orders Placed This Encounter  Procedures   Methylmalonic acid, serum    Standing Status:   Future    Number of Occurrences:   1    Expected Date:   04/06/2023    Expiration Date:   04/05/2024   Intrinsic Factor Antibodies    Standing Status:   Future    Number of Occurrences:   1    Expected Date:   04/06/2023    Expiration Date:   04/05/2024   Vitamin B12    Standing Status:   Future    Number of Occurrences:   1    Expected Date:   04/06/2023    Expiration Date:   04/05/2024    The total time spent in the appointment was 20 minutes encounter with patients including review of chart and various tests results, discussions about plan of care and coordination of care plan   All questions were answered. The patient knows to call the clinic with any problems, questions or concerns. No barriers to learning was detected.  Shelley Crumbly, MD 1/24/20253:35 PM   SUMMARY OF HEMATOLOGIC HISTORY: Iron deficiency anemia and pernicious anemia -IV [facility]era[facility] on 03/09/23 and 03/20/23  INTERVAL  HISTORY: Shelley [name] 68 y.o. female is here for follow-up for iron deficiency and pernicious anemia.She reports significant improvement in energy levels and clarity of thought since starting iron supplementation. She no longer experiences extreme fatigue and can maintain her daily activities until bedtime. She also reports resolution of skin cracking and swelling on her fingers since starting iron.  The patient was informed of a possible diagnosis of pernicious anemia, an autoimmune disease that she was told she might have in her early twenties.  She has no other complaints today and has been doing well.  I have reviewed the past medical history, past surgical history, social history and family history with the patient   ALLERGIES:  is allergic to albuterol, codeine, icosapent ethyl (epa ethyl ester) (fish), latex, morphine, predicort [prednisolone], and penicillins.  MEDICATIONS:  Current Outpatient Medications  Medication Sig Dispense Refill   amitriptyline (ELAVIL) 25 MG tablet Take by mouth.     aspirin EC 81 MG tablet Take 81 mg by mouth at bedtime.     bisoprolol (ZEBETA) 5 MG tablet Take 5 mg by mouth 2 (two) times daily.     Cholecalciferol (VITAMIN D) 125 MCG (5000 UT) CAPS Take by mouth.     diltiazem (CARDIZEM CD) 360 MG 24 hr capsule Take 1 capsule (360 mg total) by mouth daily. 90 capsule 3   estradiol (ESTRACE VAGINAL) 0.1 MG/GM vaginal cream Insert 1 gram vaginally twice weekly  42.5 g 1   fenofibrate 160 MG tablet Take 160 mg by mouth every morning.     ferrous sulfate 325 (65 FE) MG EC tablet Take 1 tablet (325 mg total) by mouth every other day. 45 tablet 3   hydrALAZINE (APRESOLINE) 25 MG tablet Take 25 mg by mouth 2 (two) times daily.     HYDROcodone-acetaminophen (NORCO) 5-325 MG tablet Take 1-2 tablets by mouth every 6 (six) hours as needed. 15 tablet 0   HYDROcodone-acetaminophen (NORCO) 5-325 MG tablet Take 2 tablets by mouth every 4 (four) hours as needed. 6 tablet  0   lansoprazole (PREVACID) 30 MG capsule Take 30 mg by mouth daily at 12 noon.     losartan (COZAAR) 50 MG tablet Take 50 mg by mouth daily.     LUMIGAN 0.01 % SOLN Place 1 drop into both eyes at bedtime.     melatonin 5 MG TABS 1 tablet in the evening Orally Once a day     metFORMIN (GLUCOPHAGE-XR) 500 MG 24 hr tablet Take 500 mg by mouth 2 (two) times daily with a meal.     montelukast (SINGULAIR) 10 MG tablet Take 10 mg by mouth every morning.      Multiple Vitamin (MULTIVITAMIN) tablet Take 1 tablet by mouth daily.     polyethylene glycol powder (MIRALAX) 17 GM/SCOOP powder 1 scoop mixed with 8 ounces of fluid Orally Once a day     rosuvastatin (CRESTOR) 20 MG tablet Take 20 mg by mouth at bedtime.     Current Facility-Administered Medications  Medication Dose Route Frequency Provider Last Rate Last Admin   0.9 %  sodium chloride infusion  500 mL Intravenous Once Tressia Danas, MD         REVIEW OF SYSTEMS:   Constitutional: Denies fevers, chills or night sweats Eyes: Denies blurriness of vision Ears, nose, mouth, throat, and face: Denies mucositis or sore throat Respiratory: Denies cough, dyspnea or wheezes Cardiovascular: Denies palpitation, chest discomfort or lower extremity swelling Gastrointestinal:  Denies nausea, heartburn or change in bowel habits Skin: Denies abnormal skin rashes Lymphatics: Denies new lymphadenopathy or easy bruising Neurological:Denies numbness, tingling or new weaknesses Behavioral/Psych: Mood is stable, no new changes  All other systems were reviewed with the patient and are negative.  PHYSICAL EXAMINATION:   Vitals:   04/06/23 1340  BP: (!) 151/73  Pulse: 71  Resp: 16  Temp: (!) 96 F (35.6 C)  SpO2: 100%    GENERAL:alert, no distress and comfortable SKIN: skin color, texture, turgor are normal, no rashes or significant lesions LUNGS: clear to auscultation and percussion with normal breathing effort HEART: regular rate & rhythm and  no murmurs and no lower extremity edema ABDOMEN:abdomen soft, non-tender and normal bowel sounds Musculoskeletal:no cyanosis of digits and no clubbing  NEURO: alert & oriented x 3 with fluent speech  LABORATORY DATA:  I have reviewed the data as listed  Lab Results  Component Value Date   WBC 6.3 03/30/2023   NEUTROABS 3.4 03/30/2023   HGB 13.2 03/30/2023   HCT 41.8 03/30/2023   MCV 82.9 03/30/2023   PLT 409 (H) 03/30/2023      Component Value Date/Time   NA 135 03/30/2023 1248   NA 134 04/04/2022 1336   K 4.2 03/30/2023 1248   CL 102 03/30/2023 1248   CO2 26 03/30/2023 1248   GLUCOSE 114 (H) 03/30/2023 1248   BUN 17 03/30/2023 1248   BUN 16 04/04/2022 1336   CREATININE 0.82 03/30/2023  1248   CALCIUM 9.7 03/30/2023 1248   PROT 7.3 03/30/2023 1248   ALBUMIN 4.4 03/30/2023 1248   AST 26 03/30/2023 1248   ALT 25 03/30/2023 1248   ALKPHOS 34 (L) 03/30/2023 1248   BILITOT 0.5 03/30/2023 1248   GFRNONAA >60 03/30/2023 1248   GFRAA >90 10/28/2013 0253       Chemistry      Component Value Date/Time   NA 135 03/30/2023 1248   NA 134 04/04/2022 1336   K 4.2 03/30/2023 1248   CL 102 03/30/2023 1248   CO2 26 03/30/2023 1248   BUN 17 03/30/2023 1248   BUN 16 04/04/2022 1336   CREATININE 0.82 03/30/2023 1248      Component Value Date/Time   CALCIUM 9.7 03/30/2023 1248   ALKPHOS 34 (L) 03/30/2023 1248   AST 26 03/30/2023 1248   ALT 25 03/30/2023 1248   BILITOT 0.5 03/30/2023 1248      Latest Reference Range & Units 03/30/23 12:48 03/30/23 12:49  Iron 28 - 170 ug/dL 86   UIBC ug/dL 244   TIBC 010 - 272 ug/dL 536 (H)   Saturation Ratios 10.4 - 31.8 % 19   Ferritin 11 - 307 ng/mL 200   Folate >5.9 ng/mL  9.9  Vitamin B12 180 - 914 pg/mL 173 (L)   (H): Data is abnormally high (L): Data is abnormally low

## 2023-04-06 NOTE — Assessment & Plan Note (Signed)
Low vitamin B12 level at 173.  Patient reported a history of probably pernicious anemia. -Will obtain intrinsic factor antibody  -Will start parenteral vitamin B12 supplementation -Repeat labs in 3 months

## 2023-04-06 NOTE — Patient Instructions (Signed)
VISIT SUMMARY:  You came in today for a follow-up visit regarding your iron deficiency and low B12 levels. You reported significant improvement in your energy levels and clarity of thought since starting iron supplementation. You no longer experience extreme fatigue and can maintain your daily activities until bedtime. Additionally, the skin cracking and swelling on your fingers have resolved since starting iron.  YOUR PLAN:  -PERNICIOUS ANEMIA: Pernicious anemia is a condition where your body can't make enough healthy red blood cells because it doesn't have enough vitamin B12. This may be due to an autoimmune condition. We will confirm this diagnosis with additional blood tests. In the meantime, you will start Vitamin B12 injections weekly for four weeks, then monthly for four doses.  -IRON DEFICIENCY: Iron deficiency means your body doesn't have enough iron, which is essential for making red blood cells. Your condition has improved with iron supplementation, and you currently do not have anemia. Continue taking iron supplements every other day for three more months.  INSTRUCTIONS:  Please follow up in three months to reassess your response to the Vitamin B12 injections and ongoing iron supplementation.

## 2023-04-08 LAB — INTRINSIC FACTOR ANTIBODIES: Intrinsic Factor: 0.9 [AU]/ml (ref 0.0–1.1)

## 2023-04-11 ENCOUNTER — Encounter: Payer: Self-pay | Admitting: Oncology

## 2023-04-11 LAB — METHYLMALONIC ACID, SERUM: Methylmalonic Acid, Quantitative: 177 nmol/L (ref 0–378)

## 2023-04-12 ENCOUNTER — Inpatient Hospital Stay: Payer: BC Managed Care – PPO

## 2023-04-12 VITALS — BP 159/82 | HR 78 | Temp 98.6°F | Resp 18

## 2023-04-12 DIAGNOSIS — D510 Vitamin B12 deficiency anemia due to intrinsic factor deficiency: Secondary | ICD-10-CM | POA: Diagnosis not present

## 2023-04-12 DIAGNOSIS — R5383 Other fatigue: Secondary | ICD-10-CM | POA: Diagnosis not present

## 2023-04-12 DIAGNOSIS — Z79899 Other long term (current) drug therapy: Secondary | ICD-10-CM | POA: Diagnosis not present

## 2023-04-12 DIAGNOSIS — D509 Iron deficiency anemia, unspecified: Secondary | ICD-10-CM | POA: Diagnosis not present

## 2023-04-12 DIAGNOSIS — Z885 Allergy status to narcotic agent status: Secondary | ICD-10-CM | POA: Diagnosis not present

## 2023-04-12 DIAGNOSIS — D508 Other iron deficiency anemias: Secondary | ICD-10-CM

## 2023-04-12 DIAGNOSIS — Z880 Allergy status to penicillin: Secondary | ICD-10-CM | POA: Diagnosis not present

## 2023-04-12 MED ORDER — CYANOCOBALAMIN 1000 MCG/ML IJ SOLN
1000.0000 ug | Freq: Once | INTRAMUSCULAR | Status: AC
  Administered 2023-04-12: 1000 ug via INTRAMUSCULAR
  Filled 2023-04-12: qty 1

## 2023-04-12 NOTE — Patient Instructions (Signed)
[facility] [place] [facility]  Discharge Instructions: Thank you for choosing [facility] to provide your oncology and hematology care.  If you have a lab appointment with the Cancer Center - please note that after April 8th, 2024, all labs will be drawn in the cancer center.  You do not have to check in or register with the main entrance as you have in the past but will complete your check-in in the cancer center.  Wear comfortable clothing and clothing appropriate for easy access to any Portacath or PICC line.   We strive to give you quality time with your provider. You may need to reschedule your appointment if you arrive late (15 or more minutes).  Arriving late affects you and other patients whose appointments are after yours.  Also, if you miss three or more appointments without notifying the office, you may be dismissed from the clinic at the provider's discretion.      For prescription refill requests, have your pharmacy contact our office and allow 72 hours for refills to be completed.    Today you received the following: Vitamin B12   Vitamin B12 Injection What is this medication? Vitamin B12 (VAHY tuh min B12) prevents and treats low vitamin B12 levels in your body. It is used in people who do not get enough vitamin B12 from their diet or when their digestive tract does not absorb enough. Vitamin B12 plays an important role in maintaining the health of your nervous system and red blood cells. This medicine may be used for other purposes; ask your health care provider or pharmacist if you have questions. COMMON BRAND NAME(S): B-12 Compliance Kit, B-12 Injection Kit, Cyomin, Dodex, LA-12, Nutri-Twelve, Physicians EZ Use B-12, Primabalt, Vitamin Deficiency Injectable System - B12 What should I tell my care team before I take this medication? They need to know if you have any of these conditions: Kidney disease Leber's  disease Megaloblastic anemia An unusual or allergic reaction to cyanocobalamin, cobalt, other medications, foods, dyes, or preservatives Pregnant or trying to get pregnant Breast-feeding How should I use this medication? This medication is injected into a muscle or deeply under the skin. It is usually given in a clinic or care team's office. However, your care team may teach you how to inject yourself. Follow all instructions. Talk to your care team about the use of this medication in children. Special care may be needed. Overdosage: If you think you have taken too much of this medicine contact a poison control center or emergency room at once. NOTE: This medicine is only for you. Do not share this medicine with others. What if I miss a dose? If you are given your dose at a clinic or care team's office, call to reschedule your appointment. If you give your own injections, and you miss a dose, take it as soon as you can. If it is almost time for your next dose, take only that dose. Do not take double or extra doses. What may interact with this medication? Alcohol Colchicine This list may not describe all possible interactions. Give your health care provider a list of all the medicines, herbs, non-prescription drugs, or dietary supplements you use. Also tell them if you smoke, drink alcohol, or use illegal drugs. Some items may interact with your medicine. What should I watch for while using this medication? Visit your care team regularly. You may need blood work done while you are  taking this medication. You may need to follow a special diet. Talk to your care team. Limit your alcohol intake and avoid smoking to get the best benefit. What side effects may I notice from receiving this medication? Side effects that you should report to your care team as soon as possible: Allergic reactions--skin rash, itching, hives, swelling of the face, lips, tongue, or throat Swelling of the ankles, hands, or  feet Trouble breathing Side effects that usually do not require medical attention (report to your care team if they continue or are bothersome): Diarrhea This list may not describe all possible side effects. Call your doctor for medical advice about side effects. You may report side effects to FDA at 1-800-FDA-1088. Where should I keep my medication? Keep out of the reach of children. Store at room temperature between 15 and 30 degrees C (59 and 85 degrees F). Protect from light. Throw away any unused medication after the expiration date. NOTE: This sheet is a summary. It may not cover all possible information. If you have questions about this medicine, talk to your doctor, pharmacist, or health care provider.  2024 Elsevier/Gold Standard (2020-11-09 00:00:00)    To help prevent nausea and vomiting after your treatment, we encourage you to take your nausea medication as directed.  BELOW ARE SYMPTOMS THAT SHOULD BE REPORTED IMMEDIATELY: *FEVER GREATER THAN 100.4 F (38 C) OR HIGHER *CHILLS OR SWEATING *NAUSEA AND VOMITING THAT IS NOT CONTROLLED WITH YOUR NAUSEA MEDICATION *UNUSUAL SHORTNESS OF BREATH *UNUSUAL BRUISING OR BLEEDING *URINARY PROBLEMS (pain or burning when urinating, or frequent urination) *BOWEL PROBLEMS (unusual diarrhea, constipation, pain near the anus) TENDERNESS IN MOUTH AND THROAT WITH OR WITHOUT PRESENCE OF ULCERS (sore throat, sores in mouth, or a toothache) UNUSUAL RASH, SWELLING OR PAIN  UNUSUAL VAGINAL DISCHARGE OR ITCHING   Items with * indicate a potential emergency and should be followed up as soon as possible or go to the Emergency Department if any problems should occur.  Please show the CHEMOTHERAPY ALERT CARD or IMMUNOTHERAPY ALERT CARD at check-in to the Emergency Department and triage nurse.  Should you have questions after your visit or need to cancel or reschedule your appointment, please contact [facility] [place] - A DEPT OF [facility] [phone]  and follow the prompts.  Office hours are 8:00 a.m. to 4:30 p.m. Monday - Friday. Please note that voicemails left after 4:00 p.m. may not be returned until the following business day.  We are closed weekends and major holidays. You have access to a nurse at all times for urgent questions. Please call the main number to the clinic [phone] and follow the prompts.  For any non-urgent questions, you may also contact your provider using MyChart. We now offer e-Visits for anyone 58 and older to request care online for non-urgent symptoms. For details visit [email or web address].   Also download the MyChart app! Go to the app store, search "MyChart", open the app, select [place], and log in with your MyChart username and password.

## 2023-04-12 NOTE — Progress Notes (Signed)
Patient tolerated Vitamin B 12 injection with no complaints voiced.  Site clean and dry with no bruising or swelling noted.  No complaints of pain.  Discharged with vital signs stable and no signs or symptoms of distress noted.   ?

## 2023-04-19 ENCOUNTER — Encounter: Payer: Self-pay | Admitting: Oncology

## 2023-04-19 ENCOUNTER — Inpatient Hospital Stay: Payer: BC Managed Care – PPO | Attending: Hematology

## 2023-04-19 VITALS — BP 159/63 | HR 79 | Resp 16

## 2023-04-19 DIAGNOSIS — E538 Deficiency of other specified B group vitamins: Secondary | ICD-10-CM | POA: Insufficient documentation

## 2023-04-19 DIAGNOSIS — Z79899 Other long term (current) drug therapy: Secondary | ICD-10-CM | POA: Insufficient documentation

## 2023-04-19 DIAGNOSIS — D509 Iron deficiency anemia, unspecified: Secondary | ICD-10-CM | POA: Insufficient documentation

## 2023-04-19 DIAGNOSIS — D508 Other iron deficiency anemias: Secondary | ICD-10-CM

## 2023-04-19 MED ORDER — CYANOCOBALAMIN 1000 MCG/ML IJ SOLN
1000.0000 ug | Freq: Once | INTRAMUSCULAR | Status: AC
  Administered 2023-04-19: 1000 ug via INTRAMUSCULAR
  Filled 2023-04-19: qty 1

## 2023-04-19 NOTE — Patient Instructions (Signed)
 [facility] [place] [facility]  Discharge Instructions: Thank you for choosing [facility] to provide your oncology and hematology care.  If you have a lab appointment with the Cancer Center - please note that after April 8th, 2024, all labs will be drawn in the cancer center.  You do not have to check in or register with the main entrance as you have in the past but will complete your check-in in the cancer center.  Wear comfortable clothing and clothing appropriate for easy access to any Portacath or PICC line.   We strive to give you quality time with your provider. You may need to reschedule your appointment if you arrive late (15 or more minutes).  Arriving late affects you and other patients whose appointments are after yours.  Also, if you miss three or more appointments without notifying the office, you may be dismissed from the clinic at the provider's discretion.      For prescription refill requests, have your pharmacy contact our office and allow 72 hours for refills to be completed.    Today you received the following chemotherapy and/or immunotherapy agents b12      To help prevent nausea and vomiting after your treatment, we encourage you to take your nausea medication as directed.  BELOW ARE SYMPTOMS THAT SHOULD BE REPORTED IMMEDIATELY: *FEVER GREATER THAN 100.4 F (38 C) OR HIGHER *CHILLS OR SWEATING *NAUSEA AND VOMITING THAT IS NOT CONTROLLED WITH YOUR NAUSEA MEDICATION *UNUSUAL SHORTNESS OF BREATH *UNUSUAL BRUISING OR BLEEDING *URINARY PROBLEMS (pain or burning when urinating, or frequent urination) *BOWEL PROBLEMS (unusual diarrhea, constipation, pain near the anus) TENDERNESS IN MOUTH AND THROAT WITH OR WITHOUT PRESENCE OF ULCERS (sore throat, sores in mouth, or a toothache) UNUSUAL RASH, SWELLING OR PAIN  UNUSUAL VAGINAL DISCHARGE OR ITCHING   Items with * indicate a potential emergency and should be followed up as  soon as possible or go to the Emergency Department if any problems should occur.  Please show the CHEMOTHERAPY ALERT CARD or IMMUNOTHERAPY ALERT CARD at check-in to the Emergency Department and triage nurse.  Should you have questions after your visit or need to cancel or reschedule your appointment, please contact [facility] [place] - A DEPT OF [facility] [phone]  and follow the prompts.  Office hours are 8:00 a.m. to 4:30 p.m. Monday - Friday. Please note that voicemails left after 4:00 p.m. may not be returned until the following business day.  We are closed weekends and major holidays. You have access to a nurse at all times for urgent questions. Please call the main number to the clinic [phone] and follow the prompts.  For any non-urgent questions, you may also contact your provider using MyChart. We now offer e-Visits for anyone 5 and older to request care online for non-urgent symptoms. For details visit [email or web address].   Also download the MyChart app! Go to the app store, search "MyChart", open the app, select [place], and log in with your MyChart username and password.

## 2023-04-19 NOTE — Progress Notes (Signed)
Patient presents today for B12 injection per providers order.  Stable during administration without incident; injection site WNL; see MAR for injection details.  Patient tolerated procedure well and without incident.  No questions or complaints noted at this time.  

## 2023-04-26 ENCOUNTER — Inpatient Hospital Stay: Payer: BC Managed Care – PPO

## 2023-04-26 VITALS — BP 170/89 | HR 80 | Temp 97.7°F | Resp 18

## 2023-04-26 DIAGNOSIS — Z79899 Other long term (current) drug therapy: Secondary | ICD-10-CM | POA: Diagnosis not present

## 2023-04-26 DIAGNOSIS — E538 Deficiency of other specified B group vitamins: Secondary | ICD-10-CM | POA: Diagnosis not present

## 2023-04-26 DIAGNOSIS — D509 Iron deficiency anemia, unspecified: Secondary | ICD-10-CM | POA: Diagnosis not present

## 2023-04-26 DIAGNOSIS — D508 Other iron deficiency anemias: Secondary | ICD-10-CM

## 2023-04-26 MED ORDER — CYANOCOBALAMIN 1000 MCG/ML IJ SOLN
1000.0000 ug | Freq: Once | INTRAMUSCULAR | Status: AC
  Administered 2023-04-26: 1000 ug via INTRAMUSCULAR
  Filled 2023-04-26: qty 1

## 2023-04-26 NOTE — Patient Instructions (Signed)

## 2023-04-26 NOTE — Progress Notes (Signed)
Patient tolerated injection with no complaints voiced.  Site clean and dry with no bruising or swelling noted at site.  See MAR for details.  Band aid applied.  Patient stable during and after injection.  Vss with discharge and left in satisfactory condition with no s/s of distress noted.

## 2023-05-03 ENCOUNTER — Inpatient Hospital Stay: Payer: BC Managed Care – PPO

## 2023-05-07 ENCOUNTER — Inpatient Hospital Stay: Payer: BC Managed Care – PPO

## 2023-05-07 VITALS — BP 162/67 | HR 70 | Temp 98.4°F | Resp 18

## 2023-05-07 DIAGNOSIS — D509 Iron deficiency anemia, unspecified: Secondary | ICD-10-CM | POA: Diagnosis not present

## 2023-05-07 DIAGNOSIS — E538 Deficiency of other specified B group vitamins: Secondary | ICD-10-CM | POA: Diagnosis not present

## 2023-05-07 DIAGNOSIS — D508 Other iron deficiency anemias: Secondary | ICD-10-CM

## 2023-05-07 DIAGNOSIS — Z79899 Other long term (current) drug therapy: Secondary | ICD-10-CM | POA: Diagnosis not present

## 2023-05-07 MED ORDER — CYANOCOBALAMIN 1000 MCG/ML IJ SOLN
1000.0000 ug | Freq: Once | INTRAMUSCULAR | Status: AC
  Administered 2023-05-07: 1000 ug via INTRAMUSCULAR
  Filled 2023-05-07: qty 1

## 2023-05-07 NOTE — Patient Instructions (Signed)

## 2023-05-07 NOTE — Progress Notes (Signed)
 Patient tolerated injection with no complaints voiced.  Site clean and dry with no bruising or swelling noted at site.  See MAR for details.  Band aid applied.  Patient stable during and after injection.  Vss with discharge and left in satisfactory condition with no s/s of distress noted.

## 2023-05-11 ENCOUNTER — Encounter: Payer: Self-pay | Admitting: Oncology

## 2023-05-11 DIAGNOSIS — E663 Overweight: Secondary | ICD-10-CM | POA: Diagnosis not present

## 2023-05-11 DIAGNOSIS — J0110 Acute frontal sinusitis, unspecified: Secondary | ICD-10-CM | POA: Diagnosis not present

## 2023-05-11 DIAGNOSIS — Z6828 Body mass index (BMI) 28.0-28.9, adult: Secondary | ICD-10-CM | POA: Diagnosis not present

## 2023-05-11 DIAGNOSIS — E538 Deficiency of other specified B group vitamins: Secondary | ICD-10-CM | POA: Diagnosis not present

## 2023-05-11 DIAGNOSIS — I10 Essential (primary) hypertension: Secondary | ICD-10-CM | POA: Diagnosis not present

## 2023-05-31 ENCOUNTER — Inpatient Hospital Stay: Payer: BC Managed Care – PPO | Attending: Hematology

## 2023-06-05 ENCOUNTER — Encounter: Payer: Self-pay | Admitting: Oncology

## 2023-06-05 ENCOUNTER — Inpatient Hospital Stay: Payer: BC Managed Care – PPO | Attending: Hematology

## 2023-06-05 VITALS — BP 148/71 | HR 73 | Temp 97.4°F | Resp 18

## 2023-06-05 DIAGNOSIS — D508 Other iron deficiency anemias: Secondary | ICD-10-CM

## 2023-06-05 DIAGNOSIS — Z79899 Other long term (current) drug therapy: Secondary | ICD-10-CM | POA: Insufficient documentation

## 2023-06-05 DIAGNOSIS — D509 Iron deficiency anemia, unspecified: Secondary | ICD-10-CM | POA: Diagnosis not present

## 2023-06-05 DIAGNOSIS — E538 Deficiency of other specified B group vitamins: Secondary | ICD-10-CM | POA: Insufficient documentation

## 2023-06-05 MED ORDER — CYANOCOBALAMIN 1000 MCG/ML IJ SOLN
1000.0000 ug | Freq: Once | INTRAMUSCULAR | Status: AC
  Administered 2023-06-05: 1000 ug via INTRAMUSCULAR
  Filled 2023-06-05: qty 1

## 2023-06-05 NOTE — Patient Instructions (Signed)

## 2023-06-05 NOTE — Progress Notes (Signed)
 Patient tolerated injection with no complaints voiced.  Site clean and dry with no bruising or swelling noted at site.  See MAR for details.  Band aid applied.  Patient stable during and after injection.  Vss with discharge and left in satisfactory condition with no s/s of distress noted.

## 2023-06-10 ENCOUNTER — Encounter: Payer: Self-pay | Admitting: Oncology

## 2023-06-22 DIAGNOSIS — I10 Essential (primary) hypertension: Secondary | ICD-10-CM | POA: Diagnosis not present

## 2023-06-22 DIAGNOSIS — H40053 Ocular hypertension, bilateral: Secondary | ICD-10-CM | POA: Diagnosis not present

## 2023-06-22 DIAGNOSIS — H40013 Open angle with borderline findings, low risk, bilateral: Secondary | ICD-10-CM | POA: Diagnosis not present

## 2023-06-28 ENCOUNTER — Inpatient Hospital Stay: Payer: BC Managed Care – PPO | Attending: Hematology

## 2023-06-28 DIAGNOSIS — D508 Other iron deficiency anemias: Secondary | ICD-10-CM

## 2023-06-28 DIAGNOSIS — K5900 Constipation, unspecified: Secondary | ICD-10-CM | POA: Insufficient documentation

## 2023-06-28 DIAGNOSIS — Z880 Allergy status to penicillin: Secondary | ICD-10-CM | POA: Diagnosis not present

## 2023-06-28 DIAGNOSIS — Z79899 Other long term (current) drug therapy: Secondary | ICD-10-CM | POA: Insufficient documentation

## 2023-06-28 DIAGNOSIS — D509 Iron deficiency anemia, unspecified: Secondary | ICD-10-CM | POA: Insufficient documentation

## 2023-06-28 DIAGNOSIS — K909 Intestinal malabsorption, unspecified: Secondary | ICD-10-CM | POA: Diagnosis not present

## 2023-06-28 DIAGNOSIS — R5383 Other fatigue: Secondary | ICD-10-CM | POA: Insufficient documentation

## 2023-06-28 DIAGNOSIS — E538 Deficiency of other specified B group vitamins: Secondary | ICD-10-CM | POA: Diagnosis not present

## 2023-06-28 DIAGNOSIS — Z885 Allergy status to narcotic agent status: Secondary | ICD-10-CM | POA: Insufficient documentation

## 2023-06-28 DIAGNOSIS — K30 Functional dyspepsia: Secondary | ICD-10-CM | POA: Insufficient documentation

## 2023-06-28 LAB — CBC WITH DIFFERENTIAL/PLATELET
Abs Immature Granulocytes: 0.01 10*3/uL (ref 0.00–0.07)
Basophils Absolute: 0 10*3/uL (ref 0.0–0.1)
Basophils Relative: 1 %
Eosinophils Absolute: 0.2 10*3/uL (ref 0.0–0.5)
Eosinophils Relative: 4 %
HCT: 39.3 % (ref 36.0–46.0)
Hemoglobin: 13.2 g/dL (ref 12.0–15.0)
Immature Granulocytes: 0 %
Lymphocytes Relative: 30 %
Lymphs Abs: 1.6 10*3/uL (ref 0.7–4.0)
MCH: 30.7 pg (ref 26.0–34.0)
MCHC: 33.6 g/dL (ref 30.0–36.0)
MCV: 91.4 fL (ref 80.0–100.0)
Monocytes Absolute: 0.5 10*3/uL (ref 0.1–1.0)
Monocytes Relative: 10 %
Neutro Abs: 2.9 10*3/uL (ref 1.7–7.7)
Neutrophils Relative %: 55 %
Platelets: 358 10*3/uL (ref 150–400)
RBC: 4.3 MIL/uL (ref 3.87–5.11)
RDW: 13.2 % (ref 11.5–15.5)
WBC: 5.2 10*3/uL (ref 4.0–10.5)
nRBC: 0 % (ref 0.0–0.2)

## 2023-06-28 LAB — IRON AND TIBC
Iron: 67 ug/dL (ref 28–170)
Saturation Ratios: 14 % (ref 10.4–31.8)
TIBC: 474 ug/dL — ABNORMAL HIGH (ref 250–450)
UIBC: 407 ug/dL

## 2023-06-28 LAB — COMPREHENSIVE METABOLIC PANEL WITH GFR
ALT: 23 U/L (ref 0–44)
AST: 25 U/L (ref 15–41)
Albumin: 4.1 g/dL (ref 3.5–5.0)
Alkaline Phosphatase: 37 U/L — ABNORMAL LOW (ref 38–126)
Anion gap: 10 (ref 5–15)
BUN: 19 mg/dL (ref 8–23)
CO2: 24 mmol/L (ref 22–32)
Calcium: 9.3 mg/dL (ref 8.9–10.3)
Chloride: 99 mmol/L (ref 98–111)
Creatinine, Ser: 0.84 mg/dL (ref 0.44–1.00)
GFR, Estimated: >60 mL/min (ref >60)
Glucose, Bld: 138 mg/dL — ABNORMAL HIGH (ref 70–99)
Potassium: 3.5 mmol/L (ref 3.5–5.1)
Sodium: 133 mmol/L — ABNORMAL LOW (ref 135–145)
Total Bilirubin: 0.4 mg/dL (ref 0.0–1.2)
Total Protein: 6.9 g/dL (ref 6.5–8.1)

## 2023-06-28 LAB — VITAMIN B12: Vitamin B-12: 226 pg/mL (ref 180–914)

## 2023-06-28 LAB — FERRITIN: Ferritin: 34 ng/mL (ref 11–307)

## 2023-06-28 LAB — FOLATE: Folate: 8.4 ng/mL (ref >5.9)

## 2023-07-05 ENCOUNTER — Inpatient Hospital Stay: Payer: BC Managed Care – PPO

## 2023-07-05 ENCOUNTER — Inpatient Hospital Stay: Payer: BC Managed Care – PPO | Admitting: Oncology

## 2023-07-05 VITALS — BP 138/62 | HR 75 | Temp 99.6°F | Resp 16 | Wt 176.6 lb

## 2023-07-05 DIAGNOSIS — D508 Other iron deficiency anemias: Secondary | ICD-10-CM

## 2023-07-05 DIAGNOSIS — K909 Intestinal malabsorption, unspecified: Secondary | ICD-10-CM | POA: Diagnosis not present

## 2023-07-05 DIAGNOSIS — E538 Deficiency of other specified B group vitamins: Secondary | ICD-10-CM

## 2023-07-05 DIAGNOSIS — Z79899 Other long term (current) drug therapy: Secondary | ICD-10-CM | POA: Diagnosis not present

## 2023-07-05 DIAGNOSIS — K5900 Constipation, unspecified: Secondary | ICD-10-CM | POA: Diagnosis not present

## 2023-07-05 DIAGNOSIS — D510 Vitamin B12 deficiency anemia due to intrinsic factor deficiency: Secondary | ICD-10-CM

## 2023-07-05 DIAGNOSIS — K30 Functional dyspepsia: Secondary | ICD-10-CM | POA: Diagnosis not present

## 2023-07-05 DIAGNOSIS — R5383 Other fatigue: Secondary | ICD-10-CM | POA: Diagnosis not present

## 2023-07-05 DIAGNOSIS — D509 Iron deficiency anemia, unspecified: Secondary | ICD-10-CM | POA: Diagnosis not present

## 2023-07-05 DIAGNOSIS — Z880 Allergy status to penicillin: Secondary | ICD-10-CM | POA: Diagnosis not present

## 2023-07-05 DIAGNOSIS — Z885 Allergy status to narcotic agent status: Secondary | ICD-10-CM | POA: Diagnosis not present

## 2023-07-05 MED ORDER — CYANOCOBALAMIN 1000 MCG/ML IJ SOLN
1000.0000 ug | Freq: Once | INTRAMUSCULAR | Status: AC
  Administered 2023-07-05: 1000 ug via INTRAMUSCULAR
  Filled 2023-07-05: qty 1

## 2023-07-05 MED ORDER — VITAMIN B-12 1000 MCG PO TABS: 1000.0000 ug | ORAL_TABLET | Freq: Every day | ORAL | 2 refills | Status: DC

## 2023-07-05 NOTE — Progress Notes (Signed)
 [facility] at [facility]  HEMATOLOGY FOLLOW-UP VISIT  Shelley Cora, PA-C  REASON FOR FOLLOW-UP: Iron deficient send pernicious anemia  ASSESSMENT & PLAN:  Patient is a 68 year old female following for iron deficiency and pernicious anemia   IDA (iron deficiency anemia) Patient is s/p IV iron with significant improvement in iron levels.  Likely secondary to malabsorption Patient reports significant improvement in her fatigue and brain fog.   -Continue oral iron supplementation every other day -Next colonoscopy due in 2026 -Repeat labs in 3 months and reassess for the need of IV iron   Vitamin B12 deficiency Low vitamin B12 level at 173.  Patient reported a history of probably pernicious anemia. Intrinsic factor: Negative, methylmalonic acid: Normal Currently getting vitamin B12 parenteral supplementation  - Continue vitamin B12 parenteral supplementation - Start oral vitamin B12 daily   Orders Placed This Encounter  Procedures   Ferritin    Standing Status:   Future    Expected Date:   10/01/2023    Expiration Date:   07/04/2024   Folate    Standing Status:   Future    Expected Date:   10/01/2023    Expiration Date:   07/04/2024   Vitamin B12    Standing Status:   Future    Expected Date:   10/01/2023    Expiration Date:   07/04/2024   CBC with Differential/Platelet    Standing Status:   Future    Expected Date:   10/01/2023    Expiration Date:   07/04/2024   Comprehensive metabolic panel with GFR    Standing Status:   Future    Expected Date:   10/01/2023    Expiration Date:   07/04/2024   Iron and TIBC    Standing Status:   Future    Expected Date:   10/01/2023    Expiration Date:   07/04/2024    The total time spent in the appointment was 20 minutes encounter with patients including review of chart and various tests results, discussions about plan of care and coordination of care plan   All questions were answered. The patient knows  to call the clinic with any problems, questions or concerns. No barriers to learning was detected.  Eduardo Grade, MD 4/24/20253:41 PM   SUMMARY OF HEMATOLOGIC HISTORY: Iron deficiency anemia and pernicious anemia -IV Feraheme  on 03/09/23 and 03/20/23 - Currently on parenteral vitamin B12 supplementation for vitamin B12 deficiency  INTERVAL HISTORY: Shelley [name] 68 y.o. female is here for follow-up for iron deficiency and vitamin B12 deficiency.  She has no complaints today.  She denies melena, hematochezia, weight loss, loss of appetite.  Overall she feels well.She takes iron pills every other day but experiences constipation and stomach upset as side effects. She has had low iron levels since birth, requiring pediatric specialist care as a baby.   I have reviewed the past medical history, past surgical history, social history and family history with the patient   ALLERGIES:  is allergic to albuterol, codeine, icosapent ethyl (epa ethyl ester) (fish), latex, morphine, predicort [prednisolone], and penicillins.  MEDICATIONS:  Current Outpatient Medications  Medication Sig Dispense Refill   amitriptyline  (ELAVIL ) 25 MG tablet Take by mouth.     aspirin  EC 81 MG tablet Take 81 mg by mouth at bedtime.     bisoprolol  (ZEBETA ) 5 MG tablet Take 5 mg by mouth 2 (two) times daily.     Cholecalciferol (VITAMIN D) 125 MCG (5000 UT) CAPS  Take by mouth.     cyanocobalamin  (VITAMIN B12) 1000 MCG tablet Take 1 tablet (1,000 mcg total) by mouth daily. 90 tablet 2   diltiazem  (CARDIZEM  CD) 360 MG 24 hr capsule Take 1 capsule (360 mg total) by mouth daily. 90 capsule 3   estradiol  (ESTRACE  VAGINAL) 0.1 MG/GM vaginal cream Insert 1 gram vaginally twice weekly 42.5 g 1   fenofibrate  160 MG tablet Take 160 mg by mouth every morning.     ferrous sulfate  325 (65 FE) MG EC tablet Take 1 tablet (325 mg total) by mouth every other day. 45 tablet 3   hydrALAZINE (APRESOLINE) 25 MG tablet Take 25 mg by mouth  2 (two) times daily.     lansoprazole (PREVACID) 30 MG capsule Take 30 mg by mouth daily at 12 noon.     losartan (COZAAR) 50 MG tablet Take 50 mg by mouth daily.     LUMIGAN 0.01 % SOLN Place 1 drop into both eyes at bedtime.     melatonin 5 MG TABS 1 tablet in the evening Orally Once a day     metFORMIN (GLUCOPHAGE-XR) 500 MG 24 hr tablet Take 500 mg by mouth daily with breakfast.     montelukast  (SINGULAIR ) 10 MG tablet Take 10 mg by mouth every morning.      Multiple Vitamin (MULTIVITAMIN) tablet Take 1 tablet by mouth daily.     polyethylene glycol powder (MIRALAX) 17 GM/SCOOP powder 1 scoop mixed with 8 ounces of fluid Orally Once a day     rosuvastatin (CRESTOR) 20 MG tablet Take 20 mg by mouth at bedtime.     Current Facility-Administered Medications  Medication Dose Route Frequency Provider Last Rate Last Admin   0.9 %  sodium chloride  infusion  500 mL Intravenous Once Lindle Rhea, MD         REVIEW OF SYSTEMS:   Constitutional: Denies fevers, chills or night sweats Eyes: Denies blurriness of vision Ears, nose, mouth, throat, and face: Denies mucositis or sore throat Respiratory: Denies cough, dyspnea or wheezes Cardiovascular: Denies palpitation, chest discomfort or lower extremity swelling Gastrointestinal:  Denies nausea, heartburn or change in bowel habits Skin: Denies abnormal skin rashes Lymphatics: Denies new lymphadenopathy or easy bruising Neurological:Denies numbness, tingling or new weaknesses Behavioral/Psych: Mood is stable, no new changes  All other systems were reviewed with the patient and are negative.  PHYSICAL EXAMINATION:   Vitals:   07/05/23 1510  BP: 138/62  Pulse: 75  Resp: 16  Temp: 99.6 F (37.6 C)  SpO2: 96%   GENERAL:alert, no distress and comfortable SKIN: skin color, texture, turgor are normal, no rashes or significant lesions LUNGS: clear to auscultation and percussion with normal breathing effort HEART: regular rate & rhythm  and no murmurs and no lower extremity edema ABDOMEN:abdomen soft, non-tender and normal bowel sounds Musculoskeletal:no cyanosis of digits and no clubbing  NEURO: alert & oriented x 3 with fluent speech  LABORATORY DATA:  I have reviewed the data as listed  Lab Results  Component Value Date   WBC 5.2 06/28/2023   NEUTROABS 2.9 06/28/2023   HGB 13.2 06/28/2023   HCT 39.3 06/28/2023   MCV 91.4 06/28/2023   PLT 358 06/28/2023      Component Value Date/Time   NA 133 (L) 06/28/2023 1533   NA 134 04/04/2022 1336   K 3.5 06/28/2023 1533   CL 99 06/28/2023 1533   CO2 24 06/28/2023 1533   GLUCOSE 138 (H) 06/28/2023 1533   BUN 19  06/28/2023 1533   BUN 16 04/04/2022 1336   CREATININE 0.84 06/28/2023 1533   CALCIUM  9.3 06/28/2023 1533   PROT 6.9 06/28/2023 1533   ALBUMIN 4.1 06/28/2023 1533   AST 25 06/28/2023 1533   ALT 23 06/28/2023 1533   ALKPHOS 37 (L) 06/28/2023 1533   BILITOT 0.4 06/28/2023 1533   GFRNONAA >60 06/28/2023 1533   GFRAA >90 10/28/2013 0253       Chemistry      Component Value Date/Time   NA 133 (L) 06/28/2023 1533   NA 134 04/04/2022 1336   K 3.5 06/28/2023 1533   CL 99 06/28/2023 1533   CO2 24 06/28/2023 1533   BUN 19 06/28/2023 1533   BUN 16 04/04/2022 1336   CREATININE 0.84 06/28/2023 1533      Component Value Date/Time   CALCIUM  9.3 06/28/2023 1533   ALKPHOS 37 (L) 06/28/2023 1533   AST 25 06/28/2023 1533   ALT 23 06/28/2023 1533   BILITOT 0.4 06/28/2023 1533      Latest Reference Range & Units 06/28/23 15:33  Iron 28 - 170 ug/dL 67  UIBC ug/dL 829  TIBC 562 - 130 ug/dL 865 (H)  Saturation Ratios 10.4 - 31.8 % 14  Ferritin 11 - 307 ng/mL 34  Folate >5.9 ng/mL 8.4  Vitamin B12 180 - 914 pg/mL 226  (H): Data is abnormally high   Latest Reference Range & Units 04/06/23 14:06  Intrinsic Factor 0.0 - 1.1 AU/mL 0.9  Methylmalonic Acid, Quantitative 0 - 378 nmol/L 177

## 2023-07-05 NOTE — Assessment & Plan Note (Signed)
 Low vitamin B12 level at 173.  Patient reported a history of probably pernicious anemia. Intrinsic factor: Negative, methylmalonic acid: Normal Currently getting vitamin B12 parenteral supplementation  - Continue vitamin B12 parenteral supplementation - Start oral vitamin B12 daily

## 2023-07-05 NOTE — Assessment & Plan Note (Deleted)
 Low vitamin B12 level at 173.  Patient reported a history of probably pernicious anemia. Intrinsic factor: Negative, methylmalonic acid: Normal Currently getting vitamin B12 parenteral supplementation  - Continue vitamin B12 parenteral supplementation - Continue oral vitamin B12 daily

## 2023-07-05 NOTE — Assessment & Plan Note (Signed)
 Patient is s/p IV iron with significant improvement in iron levels.  Likely secondary to malabsorption Patient reports significant improvement in her fatigue and brain fog.   -Continue oral iron supplementation every other day -Next colonoscopy due in 2026 -Repeat labs in 3 months and reassess for the need of IV iron

## 2023-07-05 NOTE — Progress Notes (Signed)
 Shelley [name] presents today for injection per the provider's orders.  B12 administration without incident; injection site WNL; see MAR for injection details.  Patient tolerated procedure well and without incident.  No questions or complaints noted at this time.   Discharged from clinic ambulatory in stable condition. Alert and oriented x 3. F/U with [facility] as scheduled.

## 2023-07-05 NOTE — Patient Instructions (Signed)
 [facility] [place] - A DEPT OF [facility]  Discharge Instructions: Thank you for choosing [facility] to provide your oncology and hematology care.  If you have a lab appointment with the Cancer Center - please note that after April 8th, 2024, all labs will be drawn in the cancer center.  You do not have to check in or register with the main entrance as you have in the past but will complete your check-in in the cancer center.  Wear comfortable clothing and clothing appropriate for easy access to any Portacath or PICC line.   We strive to give you quality time with your provider. You may need to reschedule your appointment if you arrive late (15 or more minutes).  Arriving late affects you and other patients whose appointments are after yours.  Also, if you miss three or more appointments without notifying the office, you may be dismissed from the clinic at the provider's discretion.      For prescription refill requests, have your pharmacy contact our office and allow 72 hours for refills to be completed.    Today you received B12 injection     BELOW ARE SYMPTOMS THAT SHOULD BE REPORTED IMMEDIATELY: *FEVER GREATER THAN 100.4 F (38 C) OR HIGHER *CHILLS OR SWEATING *NAUSEA AND VOMITING THAT IS NOT CONTROLLED WITH YOUR NAUSEA MEDICATION *UNUSUAL SHORTNESS OF BREATH *UNUSUAL BRUISING OR BLEEDING *URINARY PROBLEMS (pain or burning when urinating, or frequent urination) *BOWEL PROBLEMS (unusual diarrhea, constipation, pain near the anus) TENDERNESS IN MOUTH AND THROAT WITH OR WITHOUT PRESENCE OF ULCERS (sore throat, sores in mouth, or a toothache) UNUSUAL RASH, SWELLING OR PAIN  UNUSUAL VAGINAL DISCHARGE OR ITCHING   Items with * indicate a potential emergency and should be followed up as soon as possible or go to the Emergency Department if any problems should occur.  Please show the CHEMOTHERAPY ALERT CARD or IMMUNOTHERAPY ALERT CARD at check-in to  the Emergency Department and triage nurse.  Should you have questions after your visit or need to cancel or reschedule your appointment, please contact [facility] [place] - A DEPT OF [facility] [phone]  and follow the prompts.  Office hours are 8:00 a.m. to 4:30 p.m. Monday - Friday. Please note that voicemails left after 4:00 p.m. may not be returned until the following business day.  We are closed weekends and major holidays. You have access to a nurse at all times for urgent questions. Please call the main number to the clinic [phone] and follow the prompts.  For any non-urgent questions, you may also contact your provider using MyChart. We now offer e-Visits for anyone 57 and older to request care online for non-urgent symptoms. For details visit [email or web address].   Also download the MyChart app! Go to the app store, search "MyChart", open the app, select [place], and log in with your MyChart username and password.

## 2023-07-20 DIAGNOSIS — H40051 Ocular hypertension, right eye: Secondary | ICD-10-CM | POA: Diagnosis not present

## 2023-08-08 ENCOUNTER — Ambulatory Visit: Admitting: Gastroenterology

## 2023-08-08 ENCOUNTER — Encounter: Payer: Self-pay | Admitting: Gastroenterology

## 2023-08-08 VITALS — BP 120/80 | HR 71 | Ht 66.0 in | Wt 171.0 lb

## 2023-08-08 DIAGNOSIS — D508 Other iron deficiency anemias: Secondary | ICD-10-CM | POA: Diagnosis not present

## 2023-08-08 DIAGNOSIS — K219 Gastro-esophageal reflux disease without esophagitis: Secondary | ICD-10-CM | POA: Diagnosis not present

## 2023-08-08 DIAGNOSIS — D510 Vitamin B12 deficiency anemia due to intrinsic factor deficiency: Secondary | ICD-10-CM

## 2023-08-08 DIAGNOSIS — K5904 Chronic idiopathic constipation: Secondary | ICD-10-CM

## 2023-08-08 NOTE — Progress Notes (Addendum)
 Chief Complaint: follow-up GERD Primary GI Doctor: (previously Dr. Savannah Curlin) Dr. Yvone Herd  HPI:  Patient is a  68 year old female patient, previously known to Dr. Savannah Curlin, with past medical history of IDA, B12 deficiency, GERD, and hypertension,who presents today for uncontrolled GERD.    07/05/23 patient seen by oncology/ Hematology for iron deficiency and pernicious anemia. Patient is receiving IV iron and on oral iron every other day. Low vitamin b 12 level at 173. Intrinsic factor: Negative, methylmalonic acid: Normal. Pt on oral B12 daily.  Interval History    Patient presents with main complaint of uncontrolled GERD she states increased in severity over the past three months ago. She was having pyrosis and regurgitation on daily basis. She states she was previously on Prevacid for quite some time and recently switched to Pantoprazole  40 mg po daily by her PCP. They also added Pepcid in the evening and Sucralfate twice daily. She also tells me she uses OTC Mylanta prn at bedtime bedtime. She states she has cut out carbonation, caffeine, and late meals which has also helped. She denies dysphagia.    Patient also takes Miralax daily at bedtime for chronic constipation which regulates her bowels.   Patient on baby Aspirin  81 mg po daily.   History of iron deficiency, per patient since she was a child intermittently growing up. No overt bleeding. Pt states she did complete fecal occult over three days with PCP and negative results.  Wt Readings from Last 3 Encounters:  08/08/23 171 lb (77.6 kg)  07/05/23 176 lb 9.6 oz (80.1 kg)  04/06/23 177 lb 9.6 oz (80.6 kg)    Past Medical History:  Diagnosis Date   Allergy    Arthritis    Cataract    COPD (chronic obstructive pulmonary disease) (HCC)    Diastolic dysfunction    Diverticulitis    GERD (gastroesophageal reflux disease)    History of stress test    a. 05/2001 Neg ex cardiolite; b. 10/2013 neg ETT.   Hyperlipidemia    Hypertension     Hypertriglyceridemia    Palpitations    a. managed w/ bb and ccb.   Polycystic kidney disease    Sleep apnea    wears cpap     Past Surgical History:  Procedure Laterality Date   CATARACT EXTRACTION     Childbirth     COLONOSCOPY     TOTAL ABDOMINAL HYSTERECTOMY W/ BILATERAL SALPINGOOPHORECTOMY     UPPER GASTROINTESTINAL ENDOSCOPY      Current Outpatient Medications  Medication Sig Dispense Refill   amitriptyline  (ELAVIL ) 25 MG tablet Take by mouth.     aspirin  EC 81 MG tablet Take 81 mg by mouth at bedtime.     bisoprolol  (ZEBETA ) 5 MG tablet Take 5 mg by mouth 2 (two) times daily.     Cholecalciferol (VITAMIN D) 125 MCG (5000 UT) CAPS Take by mouth.     diltiazem  (CARDIZEM  CD) 360 MG 24 hr capsule Take 1 capsule (360 mg total) by mouth daily. 90 capsule 3   estradiol  (ESTRACE  VAGINAL) 0.1 MG/GM vaginal cream Insert 1 gram vaginally twice weekly (Patient taking differently: Apply to the outside 1 gram vaginally twice weekly) 42.5 g 1   fenofibrate  160 MG tablet Take 160 mg by mouth every morning.     ferrous sulfate  325 (65 FE) MG EC tablet Take 1 tablet (325 mg total) by mouth every other day. 45 tablet 3   hydrALAZINE (APRESOLINE) 25 MG tablet Take 25 mg  by mouth 2 (two) times daily. Takes prn     lansoprazole (PREVACID) 15 MG capsule Take 15 mg by mouth at bedtime.     losartan (COZAAR) 50 MG tablet Take 50 mg by mouth daily.     LUMIGAN 0.01 % SOLN Place 1 drop into both eyes at bedtime.     melatonin 5 MG TABS 1 tablet in the evening Orally Once a day     metFORMIN (GLUCOPHAGE-XR) 500 MG 24 hr tablet Take 500 mg by mouth daily with breakfast.     montelukast  (SINGULAIR ) 10 MG tablet Take 10 mg by mouth every morning.      Multiple Vitamin (MULTIVITAMIN) tablet Take 1 tablet by mouth daily.     OVER THE COUNTER MEDICATION B12 injections every 2 months     pantoprazole  (PROTONIX ) 40 MG tablet Take 40 mg by mouth daily.     polyethylene glycol powder (MIRALAX) 17 GM/SCOOP  powder 1 scoop mixed with 8 ounces of fluid Orally Once a day     rosuvastatin (CRESTOR) 20 MG tablet Take 20 mg by mouth at bedtime.     sucralfate (CARAFATE) 1 g tablet Take 1 g by mouth 2 (two) times daily. Makes into a slurry     Current Facility-Administered Medications  Medication Dose Route Frequency Provider Last Rate Last Admin   0.9 %  sodium chloride  infusion  500 mL Intravenous Once Lindle Rhea, MD        Allergies as of 08/08/2023 - Review Complete 08/08/2023  Allergen Reaction Noted   Albuterol Other (See Comments) 01/02/2021   Codeine Nausea And Vomiting    Icosapent ethyl (epa ethyl ester) (fish)  11/23/2021   Latex  02/09/2020   Morphine Nausea And Vomiting    Predicort [prednisolone] Other (See Comments) 01/18/2012   Penicillins Swelling and Rash     Family History  Problem Relation Age of Onset   Hyperlipidemia Mother    Hypertension Mother    Breast cancer Mother 60   Colon cancer Mother 39   Kidney failure Mother    Hyperlipidemia Father        History of coronary artery by   Bladder Cancer Father    Colon polyps Father    Breast cancer Paternal Aunt        After age 19   Breast cancer Paternal Aunt        After age 41   Esophageal cancer Neg Hx    Rectal cancer Neg Hx    Stomach cancer Neg Hx     Review of Systems:    Constitutional: No weight loss, fever, chills, weakness or fatigue HEENT: Eyes: No change in vision               Ears, Nose, Throat:  No change in hearing or congestion Skin: No rash or itching Cardiovascular: No chest pain, chest pressure or palpitations   Respiratory: No SOB or cough Gastrointestinal: See HPI and otherwise negative Genitourinary: No dysuria or change in urinary frequency Neurological: No headache, dizziness or syncope Musculoskeletal: No new muscle or joint pain Hematologic: No bleeding or bruising Psychiatric: No history of depression or anxiety    Physical Exam:  Vital signs: BP 120/80   Pulse 71    Ht 5\' 6"  (1.676 m)   Wt 171 lb (77.6 kg)   BMI 27.60 kg/m   Constitutional:  Pleasant female appears to be in NAD, Well developed, Well nourished, alert and cooperative Throat: Oral cavity and pharynx without  inflammation, swelling or lesion.  Respiratory: Respirations even and unlabored. Lungs clear to auscultation bilaterally.   No wheezes, crackles, or rhonchi.  Cardiovascular: Normal S1, S2. Regular rate and rhythm. No peripheral edema, cyanosis or pallor.  Gastrointestinal:  Soft, nondistended, nontender. No rebound or guarding. Normal bowel sounds. No appreciable masses or hepatomegaly. Rectal:  Not performed.  Msk:  Symmetrical without gross deformities. Without edema, no deformity or joint abnormality.  Neurologic:  Alert and  oriented x4;  grossly normal neurologically.  Skin:   Dry and intact without significant lesions or rashes. Psychiatric: Oriented to person, place and time. Demonstrates good judgement and reason without abnormal affect or behaviors.  RELEVANT LABS AND IMAGING: CBC    Latest Ref Rng & Units 06/28/2023    3:33 PM 03/30/2023   12:48 PM 03/16/2022    2:46 PM  CBC  WBC 4.0 - 10.5 K/uL 5.2  6.3  8.7   Hemoglobin 12.0 - 15.0 g/dL 91.4  78.2  95.6   Hematocrit 36.0 - 46.0 % 39.3  41.8  39.8   Platelets 150 - 400 K/uL 358  409  444      CMP     Latest Ref Rng & Units 06/28/2023    3:33 PM 03/30/2023   12:48 PM 04/04/2022    1:36 PM  CMP  Glucose 70 - 99 mg/dL 213  086  85   BUN 8 - 23 mg/dL 19  17  16    Creatinine 0.44 - 1.00 mg/dL 5.78  4.69  6.29   Sodium 135 - 145 mmol/L 133  135  134   Potassium 3.5 - 5.1 mmol/L 3.5  4.2  5.1   Chloride 98 - 111 mmol/L 99  102  93   CO2 22 - 32 mmol/L 24  26  21    Calcium  8.9 - 10.3 mg/dL 9.3  9.7  52.8   Total Protein 6.5 - 8.1 g/dL 6.9  7.3    Total Bilirubin 0.0 - 1.2 mg/dL 0.4  0.5    Alkaline Phos 38 - 126 U/L 37  34    AST 15 - 41 U/L 25  26    ALT 0 - 44 U/L 23  25    GI procedures: 02/09/20 colonoscopy  with Dr. Savannah Curlin, recall 5 years  Impression:  - Diverticulosis in the sigmoid colon.  - Internal hemorrhoids. - One 1 mm polyp in the descending colon, removed with a cold biopsy forceps. Resected and retrieved. - The examination was otherwise normal on direct and retroflexion views. Path: Diagnosis Surgical [P], colon, descending, polyp (1) - TUBULAR ADENOMA. - NO HIGH GRADE DYSPLASIA OR CARCINOMA  11/09/2013 colonoscopy with Dr. Willy Harvest ENDOSCOPIC IMPRESSION:  1. There was severe diverticulosis noted in the sigmoid colon  2. The examination was otherwise normal - excellent prep 11/10/2014 EGD with Dr. Willy Harvest ENDOSCOPIC IMPRESSION:  1) small hiatal hernia w/ slightly irregular Z- line  2) No Barrett' s present  3) Otherwise normal 07/31/2005 colonoscopy with Dr. Darryle Ends Normal exam- cecum to rectum. No seen: polyps, AVMs, colitis, tumors, melanosis, crohn's, diverticulosis, or hemorrhoids  04/04/19 echo-LVEF at 60 to 65%.      Assessment: Encounter Diagnoses  Name Primary?   Other iron deficiency anemia Yes   Gastroesophageal reflux disease without esophagitis    Chronic idiopathic constipation    Pernicious anemia      68 year old female patient with history of GERD, last endoscopy in 2016 showed irregular Z line negative for Barrett's.  She has had uncontrolled reflux for quite some time and now requiring multiple medications to manage her symptoms. Will scheduled upper GI endoscopy to evaluate for esophagitis and/or Barrett's. We will not make any changes to current treatment.     Patient has history of IDA and pernicious anemia, reports she has been evaluated by hematology and currently receiving IV Iron and oral iron with close monitoring. Neg fecal occult with PCP.  Intrinsic factor: Negative, methylmalonic acid: Normal.   Her chronic constipation is managed with daily Miralax, no changes needed.  UTD on colon screening w/ history of tubular adenomas, recall  01/2025.   Plan: -Continue Miralax po daily -Continue Pantoprazole  40mg  po daily -Continue Pepcid at bedtime -GERD diet, no late meals 3-4 hours before lying down. -Continue sucralfate as needed. Educated patient on SE of constipation. -follow-up with hematology as scheduled -Schedule an EGD in [facility] with Dr. Yvone Herd. The risks and benefits of EGD with possible biopsies and esophageal dilation were discussed with the patient who agrees to proceed.  -recall colonoscopy 01/2025   Thank you for the courtesy of this consult. Please call me with any questions or concerns.   Averyanna Sax, FNP-C [facility] 08/08/2023, 11:10 AM  Cc: Roxene Cora, PA*  I have reviewed the clinic note as outlined by Arlon Lamb, NP and agree with the assessment, plan and medical decision making.  Ms. Galer presents to the office for evaluation and management of GERD that has been refractory to multiple medications.  Previously on Prevacid and switched to Protonix .  Due to ongoing symptoms Pepcid was added to her regimen as well as sucralfate.  Continues to endorse symptoms of heartburn and regurgitation.  No dysphagia.  She is followed by hematology for history of iron deficiency anemia and pernicious anemia.  Up-to-date on colorectal cancer screening with previous history of tubular adenomas.  Agree with proceeding with upper endoscopy-evaluate for EOE, H. pylori, rule out Barrett's.  Eugenia Hess, MD

## 2023-08-08 NOTE — Patient Instructions (Addendum)
 Continue Miralax po daily  Recommend GERD diet, no late meals 3-4 hours before lying down Continue Pantoprazole  40mg  po daily Continue Pepcid at bedtime Continue sucralfate as needed  You have been scheduled for an endoscopy. Please follow written instructions given to you at your visit today.  If you use inhalers (even only as needed), please bring them with you on the day of your procedure.  If you take any of the following medications, they will need to be adjusted prior to your procedure:   DO NOT TAKE 7 DAYS PRIOR TO TEST- Trulicity (dulaglutide) Ozempic, Wegovy (semaglutide) Mounjaro (tirzepatide) Bydureon Bcise (exanatide extended release)  DO NOT TAKE 1 DAY PRIOR TO YOUR TEST Rybelsus (semaglutide) Adlyxin (lixisenatide) Victoza (liraglutide) Byetta (exanatide) ___________________________________________________________________________  Due to recent changes in healthcare laws, you may see the results of your imaging and laboratory studies on MyChart before your provider has had a chance to review them.  We understand that in some cases there may be results that are confusing or concerning to you. Not all laboratory results come back in the same time frame and the provider may be waiting for multiple results in order to interpret others.  Please give us  48 hours in order for your provider to thoroughly review all the results before contacting the office for clarification of your results.  _______________________________________________________  If your blood pressure at your visit was 140/90 or greater, please contact your primary care physician to follow up on this.  _______________________________________________________  If you are age 40 or older, your body mass index should be between 23-30. Your Body mass index is 27.6 kg/m. If this is out of the aforementioned range listed, please consider follow up with your Primary Care Provider.  If you are age 79 or younger,  your body mass index should be between 19-25. Your Body mass index is 27.6 kg/m. If this is out of the aformentioned range listed, please consider follow up with your Primary Care Provider.   ________________________________________________________  The [facility] providers would like to encourage you to use MYCHART to communicate with providers for non-urgent requests or questions.  Due to long hold times on the telephone, sending your provider a message by [facility] may be a faster and more efficient way to get a response.  Please allow 48 business hours for a response.  Please remember that this is for non-urgent requests.  _______________________________________________________ Thank you for trusting me with your gastrointestinal care. Deanna May, RNP.

## 2023-08-09 ENCOUNTER — Encounter: Payer: Self-pay | Admitting: Oncology

## 2023-08-09 ENCOUNTER — Inpatient Hospital Stay: Payer: BC Managed Care – PPO | Attending: Hematology

## 2023-08-09 VITALS — BP 155/81 | HR 72 | Temp 98.4°F | Resp 18

## 2023-08-09 DIAGNOSIS — D508 Other iron deficiency anemias: Secondary | ICD-10-CM

## 2023-08-09 DIAGNOSIS — D509 Iron deficiency anemia, unspecified: Secondary | ICD-10-CM | POA: Insufficient documentation

## 2023-08-09 DIAGNOSIS — Z79899 Other long term (current) drug therapy: Secondary | ICD-10-CM | POA: Insufficient documentation

## 2023-08-09 MED ORDER — CYANOCOBALAMIN 1000 MCG/ML IJ SOLN
1000.0000 ug | Freq: Once | INTRAMUSCULAR | Status: AC
  Administered 2023-08-09: 1000 ug via INTRAMUSCULAR
  Filled 2023-08-09: qty 1

## 2023-08-09 NOTE — Progress Notes (Signed)
 Patient tolerated B12 injection with no complaints voiced.  Site clean and dry with no bruising or swelling noted at site.  See MAR for details.  Band aid applied.  Patient stable during and after injection.  Vss with discharge and left in satisfactory condition with no s/s of distress noted. All follow ups as scheduled.   Shelley [name] [employer]

## 2023-08-09 NOTE — Patient Instructions (Signed)
 Vitamin B12 Injection What is this medication? Vitamin B12 (VAHY tuh min B12) prevents and treats low vitamin B12 levels in your body. It is used in people who do not get enough vitamin B12 from their diet or when their digestive tract does not absorb enough. Vitamin B12 plays an important role in maintaining the health of your nervous system and red blood cells. This medicine may be used for other purposes; ask your health care provider or pharmacist if you have questions. COMMON BRAND NAME(S): B-12 Compliance Kit, B-12 Injection Kit, Cyomin, Dodex, LA-12, Nutri-Twelve, Physicians EZ Use B-12, Primabalt, Vitamin Deficiency Injectable System - B12 What should I tell my care team before I take this medication? They need to know if you have any of these conditions: Kidney disease Leber's disease Megaloblastic anemia An unusual or allergic reaction to cyanocobalamin, cobalt, other medications, foods, dyes, or preservatives Pregnant or trying to get pregnant Breast-feeding How should I use this medication? This medication is injected into a muscle or deeply under the skin. It is usually given in a clinic or care team's office. However, your care team may teach you how to inject yourself. Follow all instructions. Talk to your care team about the use of this medication in children. Special care may be needed. Overdosage: If you think you have taken too much of this medicine contact a poison control center or emergency room at once. NOTE: This medicine is only for you. Do not share this medicine with others. What if I miss a dose? If you are given your dose at a clinic or care team's office, call to reschedule your appointment. If you give your own injections, and you miss a dose, take it as soon as you can. If it is almost time for your next dose, take only that dose. Do not take double or extra doses. What may interact with this medication? Alcohol Colchicine This list may not describe all possible  interactions. Give your health care provider a list of all the medicines, herbs, non-prescription drugs, or dietary supplements you use. Also tell them if you smoke, drink alcohol, or use illegal drugs. Some items may interact with your medicine. What should I watch for while using this medication? Visit your care team regularly. You may need blood work done while you are taking this medication. You may need to follow a special diet. Talk to your care team. Limit your alcohol intake and avoid smoking to get the best benefit. What side effects may I notice from receiving this medication? Side effects that you should report to your care team as soon as possible: Allergic reactions--skin rash, itching, hives, swelling of the face, lips, tongue, or throat Swelling of the ankles, hands, or feet Trouble breathing Side effects that usually do not require medical attention (report to your care team if they continue or are bothersome): Diarrhea This list may not describe all possible side effects. Call your doctor for medical advice about side effects. You may report side effects to FDA at 1-800-FDA-1088. Where should I keep my medication? Keep out of the reach of children. Store at room temperature between 15 and 30 degrees C (59 and 85 degrees F). Protect from light. Throw away any unused medication after the expiration date. NOTE: This sheet is a summary. It may not cover all possible information. If you have questions about this medicine, talk to your doctor, pharmacist, or health care provider.  2024 Elsevier/Gold Standard (2020-11-09 00:00:00)

## 2023-09-05 ENCOUNTER — Encounter: Payer: Self-pay | Admitting: Pediatrics

## 2023-09-08 ENCOUNTER — Encounter: Payer: Self-pay | Admitting: Oncology

## 2023-09-10 ENCOUNTER — Inpatient Hospital Stay: Attending: Hematology

## 2023-09-10 VITALS — BP 155/64 | HR 71 | Temp 99.2°F | Resp 18

## 2023-09-10 DIAGNOSIS — D509 Iron deficiency anemia, unspecified: Secondary | ICD-10-CM | POA: Insufficient documentation

## 2023-09-10 DIAGNOSIS — Z79899 Other long term (current) drug therapy: Secondary | ICD-10-CM | POA: Diagnosis not present

## 2023-09-10 DIAGNOSIS — E538 Deficiency of other specified B group vitamins: Secondary | ICD-10-CM | POA: Diagnosis not present

## 2023-09-10 DIAGNOSIS — D508 Other iron deficiency anemias: Secondary | ICD-10-CM

## 2023-09-10 MED ORDER — CYANOCOBALAMIN 1000 MCG/ML IJ SOLN
1000.0000 ug | Freq: Once | INTRAMUSCULAR | Status: AC
  Administered 2023-09-10: 1000 ug via INTRAMUSCULAR
  Filled 2023-09-10: qty 1

## 2023-09-10 NOTE — Progress Notes (Signed)
 Shelley [name] presents today for B12 injection per the provider's orders.  Stable during administration without incident; injection site WNL; see MAR for injection details.  Patient tolerated procedure well and without incident.  No questions or complaints noted at this time.

## 2023-09-11 ENCOUNTER — Encounter: Payer: Self-pay | Admitting: Oncology

## 2023-09-11 NOTE — Progress Notes (Unsigned)
 [facility] History and Physical   Primary Care Physician:  Leonce Lucie PARAS, PA-C   Reason for Procedure:  GERD, iron deficiency anemia, vitamin B12 deficiency, possible pernicious anemia  Plan:         HPI: Shelley [name] is a 68 y.o. female undergoing upper endoscopy for investigation of GERD, iron deficiency anemia, vitamin B12 deficiency and possible pernicious anemia.  Patient was seen in clinic for symptoms of GERD requiring treatment with pantoprazole  40 mg orally daily and Pepcid at bedtime.  She uses sucralfate as needed.  No dysphagia or odynophagia.  She is followed by hematology for iron deficiency anemia and low vitamin B12.  There has been concern for pernicious anemia.  Antiintrinsic factor antibody was negative.  No record of antiparietal cell antibody.  Methylmalonic acid normal.  Fecal Hemoccult with PCP negative.  Last EGD was performed in 2016 showing a small hiatal hernia with slightly irregular Z-line-no Barrett's esophagus-no biopsies performed.   Past Medical History:  Diagnosis Date   Allergy    Arthritis    Cataract    COPD (chronic obstructive pulmonary disease) (HCC)    Diastolic dysfunction    Diverticulitis    GERD (gastroesophageal reflux disease)    History of stress test    a. 05/2001 Neg ex cardiolite; b. 10/2013 neg ETT.   Hyperlipidemia    Hypertension    Hypertriglyceridemia    Palpitations    a. managed w/ bb and ccb.   Polycystic kidney disease    Sleep apnea    wears cpap     Past Surgical History:  Procedure Laterality Date   CATARACT EXTRACTION     Childbirth     COLONOSCOPY     TOTAL ABDOMINAL HYSTERECTOMY W/ BILATERAL SALPINGOOPHORECTOMY     UPPER GASTROINTESTINAL ENDOSCOPY      Prior to Admission medications   Medication Sig Start Date End Date Taking? Authorizing Provider  amitriptyline  (ELAVIL ) 25 MG tablet Take by mouth. 01/15/23   [provider]  aspirin  EC 81 MG tablet Take 81 mg by mouth at  bedtime.    [provider]  bisoprolol  (ZEBETA ) 5 MG tablet Take 5 mg by mouth 2 (two) times daily. 01/26/23   [provider]  Cholecalciferol (VITAMIN D) 125 MCG (5000 UT) CAPS Take by mouth.    [provider]  diltiazem  (CARDIZEM  CD) 360 MG 24 hr capsule Take 1 capsule (360 mg total) by mouth daily. 03/03/22   Vivienne Lonni Ingle, NP  estradiol  (ESTRACE  VAGINAL) 0.1 MG/GM vaginal cream Insert 1 gram vaginally twice weekly Patient taking differently: Apply to the outside 1 gram vaginally twice weekly 11/30/22   Prentiss Riggs A, NP  fenofibrate  160 MG tablet Take 160 mg by mouth every morning. 08/22/13   [provider]  ferrous sulfate  325 (65 FE) MG EC tablet Take 1 tablet (325 mg total) by mouth every other day. 02/15/23   Kandala, Hyndavi, MD  hydrALAZINE (APRESOLINE) 25 MG tablet Take 25 mg by mouth 2 (two) times daily. Takes prn 01/17/23   [provider]  lansoprazole (PREVACID) 15 MG capsule Take 15 mg by mouth at bedtime.    [provider]  losartan (COZAAR) 50 MG tablet Take 50 mg by mouth daily.    [provider]  LUMIGAN 0.01 % SOLN Place 1 drop into both eyes at bedtime. 08/22/19   [provider]  melatonin 5 MG TABS 1 tablet in the evening Orally Once a day  [provider]  metFORMIN (GLUCOPHAGE-XR) 500 MG 24 hr tablet Take 500 mg by mouth daily with breakfast. 08/15/21   [provider]  montelukast  (SINGULAIR ) 10 MG tablet Take 10 mg by mouth every morning.  11/10/11   [provider]  Multiple Vitamin (MULTIVITAMIN) tablet Take 1 tablet by mouth daily.    [provider]  OVER THE COUNTER MEDICATION B12 injections every 2 months    [provider]  pantoprazole  (PROTONIX ) 40 MG tablet Take 40 mg by mouth daily. 07/06/23   [provider]  polyethylene glycol powder (MIRALAX) 17 GM/SCOOP powder 1 scoop mixed with 8 ounces of fluid Orally Once a day     [provider]  rosuvastatin (CRESTOR) 20 MG tablet Take 20 mg by mouth at bedtime.    [provider]  sucralfate (CARAFATE) 1 g tablet Take 1 g by mouth 2 (two) times daily. Makes into a slurry 07/06/23   [provider]    Current Outpatient Medications  Medication Sig Dispense Refill   amitriptyline  (ELAVIL ) 25 MG tablet Take by mouth.     aspirin  EC 81 MG tablet Take 81 mg by mouth at bedtime.     bisoprolol  (ZEBETA ) 5 MG tablet Take 5 mg by mouth 2 (two) times daily.     diltiazem  (CARDIZEM  CD) 360 MG 24 hr capsule Take 1 capsule (360 mg total) by mouth daily. 90 capsule 3   fenofibrate  160 MG tablet Take 160 mg by mouth every morning.     hydrALAZINE (APRESOLINE) 25 MG tablet Take 25 mg by mouth 2 (two) times daily. Takes prn     lansoprazole (PREVACID) 15 MG capsule Take 15 mg by mouth at bedtime.     losartan (COZAAR) 50 MG tablet Take 50 mg by mouth daily.     LUMIGAN 0.01 % SOLN Place 1 drop into both eyes at bedtime.     melatonin 5 MG TABS 1 tablet in the evening Orally Once a day     metFORMIN (GLUCOPHAGE-XR) 500 MG 24 hr tablet Take 500 mg by mouth daily with breakfast.     montelukast  (SINGULAIR ) 10 MG tablet Take 10 mg by mouth every morning.      Multiple Vitamin (MULTIVITAMIN) tablet Take 1 tablet by mouth daily.     OVER THE COUNTER MEDICATION B12 injections every 2 months     pantoprazole  (PROTONIX ) 40 MG tablet Take 40 mg by mouth daily.     polyethylene glycol powder (MIRALAX) 17 GM/SCOOP powder 1 scoop mixed with 8 ounces of fluid Orally Once a day     rosuvastatin (CRESTOR) 20 MG tablet Take 20 mg by mouth at bedtime.     sucralfate (CARAFATE) 1 g tablet Take 1 g by mouth 2 (two) times daily. Makes into a slurry     Cholecalciferol (VITAMIN D) 125 MCG (5000 UT) CAPS Take by mouth. (Patient not taking: Reported on 09/12/2023)     estradiol  (ESTRACE  VAGINAL) 0.1 MG/GM vaginal cream Insert 1 gram vaginally twice weekly (Patient taking  differently: Apply to the outside 1 gram vaginally twice weekly) 42.5 g 1   ferrous sulfate  325 (65 FE) MG EC tablet Take 1 tablet (325 mg total) by mouth every other day. 45 tablet 3   Current Facility-Administered Medications  Medication Dose Route Frequency Provider Last Rate Last Admin   0.9 %  sodium chloride  infusion  500 mL Intravenous Once Beavers, Kimberly, MD       0.9 %  sodium chloride  infusion  500 mL Intravenous Once [name] Inocente HERO, MD        Allergies as of 09/12/2023 - Review Complete 09/12/2023  Allergen Reaction Noted   Albuterol Other (See Comments) 01/02/2021   Icosapent ethyl (epa ethyl ester) (fish) Itching 11/23/2021   Latex Other (See Comments) 02/09/2020   Codeine Nausea And Vomiting    Morphine Nausea And Vomiting    Penicillins Swelling and Rash    Predicort [prednisolone] Nausea And Vomiting and Other (See Comments) 01/18/2012    Family History  Problem Relation Age of Onset   Hyperlipidemia Mother    Hypertension Mother    Breast cancer Mother 20   Colon cancer Mother 57   Kidney failure Mother    Hyperlipidemia Father        History of coronary artery by   Bladder Cancer Father    Colon polyps Father    Breast cancer Paternal Aunt        After age 59   Breast cancer Paternal Aunt        After age 36   Esophageal cancer Neg Hx    Rectal cancer Neg Hx    Stomach cancer Neg Hx     Social History   Socioeconomic History   Marital status: Divorced    Spouse name: Not on file   Number of children: 1   Years of education: 12   Highest education level: 12th grade  Occupational History   Occupation: Full time    Occupation: HR    Employer: [employer]  Tobacco Use   Smoking status: Former    Current packs/day: 0.00    Average packs/day: 1 pack/day for 35.0 years (35.0 ttl pk-yrs)    Types: Cigarettes    Start date: 11/26/1975    Quit date: 11/26/2010    Years since quitting: 12.8    Passive exposure: Past   Smokeless tobacco: Never   Vaping Use   Vaping status: Never Used  Substance and Sexual Activity   Alcohol use: No   Drug use: No   Sexual activity: Not Currently    Comment: 1st intercourse 68 YEARS OLD, Fewer than 5 PARTNERS,des neg  Other Topics Concern   Not on file  Social History Narrative   Not on file   Social Drivers of Health   Financial Resource Strain: Low Risk  (07/27/2022)   Overall Financial Resource Strain (CARDIA)    Difficulty of Paying Living Expenses: Not hard at all  Food Insecurity: No Food Insecurity (07/27/2022)   Hunger Vital Sign    Worried About Running Out of Food in the Last Year: Never true    Ran Out of Food in the Last Year: Never true  Transportation Needs: No Transportation Needs (07/27/2022)   PRAPARE - Administrator, Civil Service (Medical): No    Lack of Transportation (Non-Medical): No  Physical Activity: Inactive (07/27/2022)   Exercise Vital Sign    Days of Exercise per Week: 0 days    Minutes of Exercise per Session: 0 min  Stress: No Stress Concern Present (07/27/2022)   [employer] of Occupational Health - Occupational Stress Questionnaire    Feeling of Stress : Not at all  Social Connections: Moderately Integrated (07/27/2022)   Social Connection and Isolation Panel    Frequency of Communication with Friends and Family: More than three times a week    Frequency of Social Gatherings with Friends and Family: More than three times a week  Attends Religious Services: More than 4 times per year    Active Member of Clubs or Organizations: Yes    Attends Banker Meetings: More than 4 times per year    Marital Status: Divorced  Intimate Partner Violence: Not At Risk (07/27/2022)   Humiliation, Afraid, Rape, and Kick questionnaire    Fear of Current or Ex-Partner: No    Emotionally Abused: No    Physically Abused: No    Sexually Abused: No    Review of Systems:  All other review of systems negative except as mentioned in the  HPI.  Physical Exam: Vital signs BP (!) 155/69   Pulse 65   Temp (!) 97.2 F (36.2 C) (Temporal)   Ht 5' 6 (1.676 m)   Wt 171 lb (77.6 kg)   SpO2 97%   BMI 27.60 kg/m   General:   Alert,  Well-developed, well-nourished, pleasant and cooperative in NAD Airway:  Mallampati 2 Lungs:  Clear throughout to auscultation.   Heart:  Regular rate and rhythm; no murmurs, clicks, rubs,  or gallops. Abdomen:  Soft, nontender and nondistended. Normal bowel sounds.   Neuro/Psych:  Normal mood and affect. A and O x 3  Inocente Hausen, MD [facility]

## 2023-09-12 ENCOUNTER — Ambulatory Visit: Admitting: Pediatrics

## 2023-09-12 ENCOUNTER — Encounter: Payer: Self-pay | Admitting: Pediatrics

## 2023-09-12 VITALS — BP 158/68 | HR 64 | Temp 97.2°F | Resp 16 | Ht 66.0 in | Wt 171.0 lb

## 2023-09-12 DIAGNOSIS — D509 Iron deficiency anemia, unspecified: Secondary | ICD-10-CM

## 2023-09-12 DIAGNOSIS — K2289 Other specified disease of esophagus: Secondary | ICD-10-CM

## 2023-09-12 DIAGNOSIS — K219 Gastro-esophageal reflux disease without esophagitis: Secondary | ICD-10-CM

## 2023-09-12 DIAGNOSIS — K3189 Other diseases of stomach and duodenum: Secondary | ICD-10-CM | POA: Diagnosis not present

## 2023-09-12 DIAGNOSIS — D508 Other iron deficiency anemias: Secondary | ICD-10-CM

## 2023-09-12 DIAGNOSIS — K319 Disease of stomach and duodenum, unspecified: Secondary | ICD-10-CM | POA: Diagnosis not present

## 2023-09-12 DIAGNOSIS — E538 Deficiency of other specified B group vitamins: Secondary | ICD-10-CM

## 2023-09-12 DIAGNOSIS — K2970 Gastritis, unspecified, without bleeding: Secondary | ICD-10-CM

## 2023-09-12 DIAGNOSIS — K317 Polyp of stomach and duodenum: Secondary | ICD-10-CM

## 2023-09-12 DIAGNOSIS — D510 Vitamin B12 deficiency anemia due to intrinsic factor deficiency: Secondary | ICD-10-CM

## 2023-09-12 MED ORDER — SODIUM CHLORIDE 0.9 % IV SOLN: 500.0000 mL | Freq: Once | INTRAVENOUS | Status: DC

## 2023-09-12 NOTE — Op Note (Signed)
 [facility] Patient Name: Shelley [name] Procedure Date: 09/12/2023 10:24 AM MRN: [id number] Endoscopist: Inocente Hausen , MD, [id number] Age: 68 Referring MD:  Date of Birth: [birth year] Gender: Female Account #: [id number] Procedure:                Upper GI endoscopy Indications:              Iron deficiency anemia, Follow-up of                            gastro-esophageal reflux disease, Vitamin B12                            deficiency, Concern for pernicious anemia Medicines:                Monitored Anesthesia Care Procedure:                Pre-Anesthesia Assessment:                           - Prior to the procedure, a History and Physical                            was performed, and patient medications and                            allergies were reviewed. The patient's tolerance of                            previous anesthesia was also reviewed. The risks                            and benefits of the procedure and the sedation                            options and risks were discussed with the patient.                            All questions were answered, and informed consent                            was obtained. Prior Anticoagulants: The patient has                            taken no anticoagulant or antiplatelet agents. ASA                            Grade Assessment: III - A patient with severe                            systemic disease. After reviewing the risks and                            benefits, the patient was deemed in satisfactory  condition to undergo the procedure.                           After obtaining informed consent, the endoscope was                            passed under direct vision. Throughout the                            procedure, the patient's blood pressure, pulse, and                            oxygen saturations were monitored continuously. The                            GIF [id number] #[id number] was  introduced through the                            mouth, and advanced to the second part of duodenum.                            The upper GI endoscopy was accomplished without                            difficulty. The patient tolerated the procedure                            well. Scope In: Scope Out: Findings:                 The examined esophagus was normal. Z-line was                            slightly irregular but length of changes did not                            meet criteria for Barrett's esophagus.                           A few diminutive sessile polyps were found in the                            gastric fundus.                           Diffuse moderate inflammation characterized by                            erosions and erythema was found in the gastric                            antrum and in the prepyloric region of the stomach.                            Biopsies were taken with a cold forceps for  Helicobacter pylori testing.                           The gastric body, cardia (on retroflexion) and                            gastric fundus (on retroflexion) were normal.                            Biopsies were taken with a cold forceps for                            Helicobacter pylori testing.                           The duodenal bulb and second portion of the                            duodenum were normal. Biopsies for histology were                            taken with a cold forceps for evaluation of celiac                            disease. Complications:            No immediate complications. Estimated blood loss:                            Minimal. Estimated Blood Loss:     Estimated blood loss was minimal. Impression:               - Normal esophagus.                           - A few gastric polyps.                           - Gastritis. Biopsied.                           - Normal gastric body, cardia and gastric fundus.                             Biopsied.                           - Normal duodenal bulb and second portion of the                            duodenum. Biopsied. Recommendation:           - Discharge patient to home (ambulatory).                           - Await pathology results.                           -  Continue present medications.                           - The findings and recommendations were discussed                            with the patient's family.                           - Return to GI clinic in 2-3 months with Dr.                            [name] or APP.                           - Patient has a contact number available for                            emergencies. The signs and symptoms of potential                            delayed complications were discussed with the                            patient. Return to normal activities tomorrow.                            Written discharge instructions were provided to the                            patient. Inocente [name], MD 09/12/2023 10:57:33 AM This report has been signed electronically.

## 2023-09-12 NOTE — Patient Instructions (Signed)

## 2023-09-12 NOTE — Progress Notes (Signed)
 Called to room to assist during endoscopic procedure.  Patient ID and intended procedure confirmed with present staff. Received instructions for my participation in the procedure from the performing physician.

## 2023-09-12 NOTE — Progress Notes (Signed)
 Pt sedate, gd SR's, VSS, report to RN

## 2023-09-13 ENCOUNTER — Telehealth: Payer: Self-pay | Admitting: *Deleted

## 2023-09-13 NOTE — Telephone Encounter (Signed)
  Follow up Call-     09/12/2023    9:21 AM  Call back number  Post procedure Call Back phone  # [phone]  Permission to leave phone message Yes     Patient questions:  Do you have a fever, pain , or abdominal swelling? No. Pain Score  0 *  Have you tolerated food without any problems? Yes.    Have you been able to return to your normal activities? Yes.    Do you have any questions about your discharge instructions: Diet   No. Medications  No. Follow up visit  No.  Do you have questions or concerns about your Care? No.  Actions: * If pain score is 4 or above: No action needed, pain <4.

## 2023-09-18 LAB — SURGICAL PATHOLOGY

## 2023-09-19 ENCOUNTER — Ambulatory Visit: Payer: Self-pay | Admitting: Pediatrics

## 2023-09-21 DIAGNOSIS — H40012 Open angle with borderline findings, low risk, left eye: Secondary | ICD-10-CM | POA: Diagnosis not present

## 2023-10-03 ENCOUNTER — Inpatient Hospital Stay: Attending: Hematology

## 2023-10-03 DIAGNOSIS — E538 Deficiency of other specified B group vitamins: Secondary | ICD-10-CM | POA: Insufficient documentation

## 2023-10-03 DIAGNOSIS — D75839 Thrombocytosis, unspecified: Secondary | ICD-10-CM | POA: Insufficient documentation

## 2023-10-03 DIAGNOSIS — K5900 Constipation, unspecified: Secondary | ICD-10-CM | POA: Diagnosis not present

## 2023-10-03 DIAGNOSIS — Z7982 Long term (current) use of aspirin: Secondary | ICD-10-CM | POA: Diagnosis not present

## 2023-10-03 DIAGNOSIS — Z885 Allergy status to narcotic agent status: Secondary | ICD-10-CM | POA: Insufficient documentation

## 2023-10-03 DIAGNOSIS — Z880 Allergy status to penicillin: Secondary | ICD-10-CM | POA: Insufficient documentation

## 2023-10-03 DIAGNOSIS — D508 Other iron deficiency anemias: Secondary | ICD-10-CM | POA: Insufficient documentation

## 2023-10-03 DIAGNOSIS — D509 Iron deficiency anemia, unspecified: Secondary | ICD-10-CM | POA: Diagnosis not present

## 2023-10-03 DIAGNOSIS — Z79899 Other long term (current) drug therapy: Secondary | ICD-10-CM | POA: Diagnosis not present

## 2023-10-03 LAB — CBC WITH DIFFERENTIAL/PLATELET
Abs Immature Granulocytes: 0.01 K/uL (ref 0.00–0.07)
Basophils Absolute: 0 K/uL (ref 0.0–0.1)
Basophils Relative: 1 %
Eosinophils Absolute: 0.2 K/uL (ref 0.0–0.5)
Eosinophils Relative: 3 %
HCT: 43 % (ref 36.0–46.0)
Hemoglobin: 13.9 g/dL (ref 12.0–15.0)
Immature Granulocytes: 0 %
Lymphocytes Relative: 30 %
Lymphs Abs: 1.7 K/uL (ref 0.7–4.0)
MCH: 29.6 pg (ref 26.0–34.0)
MCHC: 32.3 g/dL (ref 30.0–36.0)
MCV: 91.5 fL (ref 80.0–100.0)
Monocytes Absolute: 0.7 K/uL (ref 0.1–1.0)
Monocytes Relative: 12 %
Neutro Abs: 3 K/uL (ref 1.7–7.7)
Neutrophils Relative %: 54 %
Platelets: 409 K/uL — ABNORMAL HIGH (ref 150–400)
RBC: 4.7 MIL/uL (ref 3.87–5.11)
RDW: 12.8 % (ref 11.5–15.5)
WBC: 5.5 K/uL (ref 4.0–10.5)
nRBC: 0 % (ref 0.0–0.2)

## 2023-10-03 LAB — IRON AND TIBC
Iron: 46 ug/dL (ref 28–170)
Saturation Ratios: 9 % — ABNORMAL LOW (ref 10.4–31.8)
TIBC: 521 ug/dL — ABNORMAL HIGH (ref 250–450)
UIBC: 475 ug/dL

## 2023-10-03 LAB — COMPREHENSIVE METABOLIC PANEL WITH GFR
ALT: 21 U/L (ref 0–44)
AST: 23 U/L (ref 15–41)
Albumin: 4.3 g/dL (ref 3.5–5.0)
Alkaline Phosphatase: 41 U/L (ref 38–126)
Anion gap: 12 (ref 5–15)
BUN: 14 mg/dL (ref 8–23)
CO2: 25 mmol/L (ref 22–32)
Calcium: 9.8 mg/dL (ref 8.9–10.3)
Chloride: 101 mmol/L (ref 98–111)
Creatinine, Ser: 0.76 mg/dL (ref 0.44–1.00)
GFR, Estimated: >60 mL/min (ref >60)
Glucose, Bld: 106 mg/dL — ABNORMAL HIGH (ref 70–99)
Potassium: 4.7 mmol/L (ref 3.5–5.1)
Sodium: 138 mmol/L (ref 135–145)
Total Bilirubin: 0.5 mg/dL (ref 0.0–1.2)
Total Protein: 7.3 g/dL (ref 6.5–8.1)

## 2023-10-03 LAB — FOLATE: Folate: 8.4 ng/mL (ref >5.9)

## 2023-10-03 LAB — FERRITIN: Ferritin: 10 ng/mL — ABNORMAL LOW (ref 11–307)

## 2023-10-03 LAB — VITAMIN B12: Vitamin B-12: 208 pg/mL (ref 180–914)

## 2023-10-08 ENCOUNTER — Encounter: Payer: Self-pay | Admitting: Oncology

## 2023-10-10 ENCOUNTER — Inpatient Hospital Stay

## 2023-10-10 ENCOUNTER — Inpatient Hospital Stay: Admitting: Oncology

## 2023-10-10 ENCOUNTER — Inpatient Hospital Stay (HOSPITAL_BASED_OUTPATIENT_CLINIC_OR_DEPARTMENT_OTHER): Admitting: Oncology

## 2023-10-10 ENCOUNTER — Encounter: Payer: Self-pay | Admitting: Oncology

## 2023-10-10 VITALS — BP 134/71 | HR 67 | Temp 98.6°F | Resp 18 | Wt 170.7 lb

## 2023-10-10 VITALS — BP 137/67 | HR 65 | Temp 98.6°F | Resp 18

## 2023-10-10 DIAGNOSIS — E538 Deficiency of other specified B group vitamins: Secondary | ICD-10-CM | POA: Diagnosis not present

## 2023-10-10 DIAGNOSIS — D508 Other iron deficiency anemias: Secondary | ICD-10-CM | POA: Diagnosis not present

## 2023-10-10 DIAGNOSIS — Z880 Allergy status to penicillin: Secondary | ICD-10-CM | POA: Diagnosis not present

## 2023-10-10 DIAGNOSIS — D75839 Thrombocytosis, unspecified: Secondary | ICD-10-CM | POA: Insufficient documentation

## 2023-10-10 DIAGNOSIS — Z79899 Other long term (current) drug therapy: Secondary | ICD-10-CM | POA: Diagnosis not present

## 2023-10-10 DIAGNOSIS — K5900 Constipation, unspecified: Secondary | ICD-10-CM | POA: Diagnosis not present

## 2023-10-10 DIAGNOSIS — D510 Vitamin B12 deficiency anemia due to intrinsic factor deficiency: Secondary | ICD-10-CM

## 2023-10-10 DIAGNOSIS — D509 Iron deficiency anemia, unspecified: Secondary | ICD-10-CM | POA: Diagnosis not present

## 2023-10-10 DIAGNOSIS — Z7982 Long term (current) use of aspirin: Secondary | ICD-10-CM | POA: Diagnosis not present

## 2023-10-10 DIAGNOSIS — Z885 Allergy status to narcotic agent status: Secondary | ICD-10-CM | POA: Diagnosis not present

## 2023-10-10 MED ORDER — CYANOCOBALAMIN 1000 MCG/ML IJ SOLN
1000.0000 ug | Freq: Once | INTRAMUSCULAR | Status: AC
  Administered 2023-10-10: 1000 ug via INTRAMUSCULAR
  Filled 2023-10-10: qty 1

## 2023-10-10 NOTE — Progress Notes (Signed)
 [facility] at [facility]  HEMATOLOGY FOLLOW-UP VISIT  Shelley Lucie PARAS, PA-C  REASON FOR FOLLOW-UP: Iron deficient send pernicious anemia  ASSESSMENT & PLAN:  Patient is a 68 year old female following for iron deficiency and pernicious anemia  Assessment & Plan Other iron deficiency anemia Patient is s/p IV iron with significant improvement in iron levels.  Likely secondary to malabsorption Patient reports significant improvement in her fatigue and brain fog.  Recent endoscopy with polyps and gastric fundus, inflammation in the stomach but no evidence of bleeding Iron labs reviewed today consistent with iron deficiency  -Will administer 2 more doses of IV iron.  Reemphasized side effects including allergic reactions, nausea and headache.  Patient is in agreement. -Continue oral iron supplementation every other day.  Patient has poor tolerance with nausea vomiting. -Next colonoscopy due in 2026  Repeat labs in 3 months and reassess for the need of IV iron  Vitamin B12 deficiency Low vitamin B12 level at 173.  Patient reported a history of probably pernicious anemia. Intrinsic factor: Negative, methylmalonic acid: Normal Currently getting vitamin B12 parenteral supplementation  - Continue vitamin B12 parenteral supplementation - Continue oral vitamin B12 daily.  Patient reports poor tolerance with upper GI symptoms. Thrombocytosis Likely secondary to iron deficiency  - Will replace with IV iron. - Patient unable to tolerate oral iron very well. - Continue to monitor counts     Orders Placed This Encounter  Procedures   Ferritin    Standing Status:   Future    Expected Date:   01/07/2024    Expiration Date:   04/06/2024   Folate    Standing Status:   Future    Expected Date:   01/07/2024    Expiration Date:   04/06/2024   Vitamin B12    Standing Status:   Future    Expected Date:   01/07/2024    Expiration Date:   04/06/2024   CBC with  Differential/Platelet    Standing Status:   Future    Expected Date:   01/07/2024    Expiration Date:   04/06/2024   Comprehensive metabolic panel with GFR    Standing Status:   Future    Expected Date:   01/07/2024    Expiration Date:   04/06/2024   Iron and TIBC    Standing Status:   Future    Expected Date:   01/07/2024    Expiration Date:   04/06/2024    The total time spent in the appointment was 25 minutes encounter with patients including review of chart and various tests results, discussions about plan of care and coordination of care plan   All questions were answered. The patient knows to call the clinic with any problems, questions or concerns. No barriers to learning was detected.  Mickiel Dry, MD 7/30/202511:58 AM   SUMMARY OF HEMATOLOGIC HISTORY: Iron deficiency anemia and pernicious anemia -IV Feraheme  on 03/09/23 and 03/20/23 - Currently on parenteral vitamin B12 supplementation for vitamin B12 deficiency  INTERVAL HISTORY: Shelley [name] 68 y.o. female is here for follow-up for iron deficiency and vitamin B12 deficiency.  She has no complaints today.  She reports some fatigue from taking care of her elderly father who underwent surgeries.  She denies melena, hematochezia, weight loss, loss of appetite.  Overall she feels well.She takes iron pills every other day but experiences constipation and stomach upset as side effects.   I have reviewed the past medical history, past surgical history, social  history and family history with the patient   ALLERGIES:  is allergic to albuterol, icosapent ethyl (epa ethyl ester) (fish), latex, codeine, morphine, penicillins, and predicort [prednisolone].  MEDICATIONS:  Current Outpatient Medications  Medication Sig Dispense Refill   amitriptyline  (ELAVIL ) 25 MG tablet Take by mouth.     aspirin  EC 81 MG tablet Take 81 mg by mouth at bedtime.     bisoprolol  (ZEBETA ) 5 MG tablet Take 5 mg by mouth 2 (two) times daily.      Cholecalciferol (VITAMIN D) 125 MCG (5000 UT) CAPS Take by mouth.     diltiazem  (CARDIZEM  CD) 360 MG 24 hr capsule Take 1 capsule (360 mg total) by mouth daily. 90 capsule 3   estradiol  (ESTRACE  VAGINAL) 0.1 MG/GM vaginal cream Insert 1 gram vaginally twice weekly (Patient taking differently: Apply to the outside 1 gram vaginally twice weekly) 42.5 g 1   fenofibrate  160 MG tablet Take 160 mg by mouth every morning.     ferrous sulfate  325 (65 FE) MG EC tablet Take 1 tablet (325 mg total) by mouth every other day. 45 tablet 3   hydrALAZINE (APRESOLINE) 25 MG tablet Take 25 mg by mouth 2 (two) times daily. Takes prn     lansoprazole (PREVACID) 15 MG capsule Take 15 mg by mouth at bedtime.     losartan (COZAAR) 50 MG tablet Take 50 mg by mouth daily.     LUMIGAN 0.01 % SOLN Place 1 drop into both eyes at bedtime.     melatonin 5 MG TABS 1 tablet in the evening Orally Once a day     metFORMIN (GLUCOPHAGE-XR) 500 MG 24 hr tablet Take 500 mg by mouth daily with breakfast.     montelukast  (SINGULAIR ) 10 MG tablet Take 10 mg by mouth every morning.      Multiple Vitamin (MULTIVITAMIN) tablet Take 1 tablet by mouth daily.     OVER THE COUNTER MEDICATION B12 injections every 2 months     pantoprazole  (PROTONIX ) 40 MG tablet Take 40 mg by mouth daily.     polyethylene glycol powder (MIRALAX) 17 GM/SCOOP powder 1 scoop mixed with 8 ounces of fluid Orally Once a day     rosuvastatin (CRESTOR) 20 MG tablet Take 20 mg by mouth at bedtime.     sucralfate (CARAFATE) 1 g tablet Take 1 g by mouth 2 (two) times daily. Makes into a slurry     Current Facility-Administered Medications  Medication Dose Route Frequency Provider Last Rate Last Admin   0.9 %  sodium chloride  infusion  500 mL Intravenous Once [name], MD         REVIEW OF SYSTEMS:   Constitutional: Denies fevers, chills or night sweats Eyes: Denies blurriness of vision Ears, nose, mouth, throat, and face: Denies mucositis or sore  throat Respiratory: Denies cough, dyspnea or wheezes Cardiovascular: Denies palpitation, chest discomfort or lower extremity swelling Gastrointestinal:  Denies nausea, heartburn or change in bowel habits Skin: Denies abnormal skin rashes Lymphatics: Denies new lymphadenopathy or easy bruising Neurological:Denies numbness, tingling or new weaknesses Behavioral/Psych: Mood is stable, no new changes  All other systems were reviewed with the patient and are negative.  PHYSICAL EXAMINATION:   Vitals:   10/10/23 1023  BP: 134/71  Pulse: 67  Resp: 18  Temp: 98.6 F (37 C)  SpO2: 97%   GENERAL:alert, no distress and comfortable SKIN: skin color, texture, turgor are normal, no rashes or significant lesions LUNGS: clear to auscultation and percussion with normal  breathing effort HEART: regular rate & rhythm and no murmurs and no lower extremity edema ABDOMEN:abdomen soft, non-tender and normal bowel sounds Musculoskeletal:no cyanosis of digits and no clubbing  NEURO: alert & oriented x 3 with fluent speech  LABORATORY DATA:  I have reviewed the data as listed  Lab Results  Component Value Date   WBC 5.5 10/03/2023   NEUTROABS 3.0 10/03/2023   HGB 13.9 10/03/2023   HCT 43.0 10/03/2023   MCV 91.5 10/03/2023   PLT 409 (H) 10/03/2023      Component Value Date/Time   NA 138 10/03/2023 1415   NA 134 04/04/2022 1336   K 4.7 10/03/2023 1415   CL 101 10/03/2023 1415   CO2 25 10/03/2023 1415   GLUCOSE 106 (H) 10/03/2023 1415   BUN 14 10/03/2023 1415   BUN 16 04/04/2022 1336   CREATININE 0.76 10/03/2023 1415   CALCIUM  9.8 10/03/2023 1415   PROT 7.3 10/03/2023 1415   ALBUMIN 4.3 10/03/2023 1415   AST 23 10/03/2023 1415   ALT 21 10/03/2023 1415   ALKPHOS 41 10/03/2023 1415   BILITOT 0.5 10/03/2023 1415   GFRNONAA >60 10/03/2023 1415   GFRAA >90 10/28/2013 0253       Chemistry      Component Value Date/Time   NA 138 10/03/2023 1415   NA 134 04/04/2022 1336   K 4.7  10/03/2023 1415   CL 101 10/03/2023 1415   CO2 25 10/03/2023 1415   BUN 14 10/03/2023 1415   BUN 16 04/04/2022 1336   CREATININE 0.76 10/03/2023 1415      Component Value Date/Time   CALCIUM  9.8 10/03/2023 1415   ALKPHOS 41 10/03/2023 1415   AST 23 10/03/2023 1415   ALT 21 10/03/2023 1415   BILITOT 0.5 10/03/2023 1415      Latest Reference Range & Units 10/03/23 14:15  Iron 28 - 170 ug/dL 46  UIBC ug/dL 524  TIBC 749 - 549 ug/dL 478 (H)  Saturation Ratios 10.4 - 31.8 % 9 (L)  Ferritin 11 - 307 ng/mL 10 (L)  Folate >5.9 ng/mL 8.4  Vitamin B12 180 - 914 pg/mL 208  (H): Data is abnormally high (L): Data is abnormally low   Latest Reference Range & Units 04/06/23 14:06  Intrinsic Factor 0.0 - 1.1 AU/mL 0.9  Methylmalonic Acid, Quantitative 0 - 378 nmol/L 177   Endoscopy: 09/12/23:  Findings:  - The examined esophagus was normal. Z- line was slightly irregular but length of changes did not meet criteria for Barrett' s esophagus.  - A few diminutive sessile polyps were found in the gastric fundus.  - Diffuse moderate inflammation characterized by erosions and erythema was found in the gastric antrum and in the prepyloric region of the stomach. Biopsies were taken with a cold forceps for Helicobacter pylori testing.  - The gastric body, cardia ( on retroflexion) and gastric fundus ( on retroflexion) were normal. Biopsies were taken with a cold forceps for Helicobacter pylori testing.  - The duodenal bulb and second portion of the duodenum were normal. Biopsies for histology were taken with a cold forceps for evaluation of celiac disease.  Pathology: 09/12/23:  FINAL DIAGNOSIS       1. Surgical [P], duodenal bulb, 2nd portion of duodenum, and distal duodenum :      - DUODENAL MUCOSA WITH NO SPECIFIC HISTOPATHOLOGIC CHANGES      - NEGATIVE FOR INCREASED INTRAEPITHELIAL LYMPHOCYTES OR VILLOUS ARCHITECTURAL      CHANGES  2. Surgical [P], gastric antrum :      - GASTRIC ANTRAL  MUCOSA WITH NONSPECIFIC REACTIVE GASTROPATHY      - HELICOBACTER PYLORI-LIKE ORGANISMS ARE NOT IDENTIFIED ON ROUTINE H&E STAIN       3. Surgical [P], gastric incisura :      - GASTRIC ANTRAL MUCOSA WITH NONSPECIFIC REACTIVE GASTROPATHY      - HELICOBACTER PYLORI-LIKE ORGANISMS ARE NOT IDENTIFIED ON ROUTINE H&E STAIN       4. Surgical [P], gastric body :      - GASTRIC OXYNTIC MUCOSA WITH PARIETAL CELL HYPERPLASIA AS CAN BE SEEN IN      HYPERGASTRINEMIC STATES SUCH AS PPI THERAPY.      - HELICOBACTER PYLORI-LIKE ORGANISMS ARE NOT IDENTIFIED ON ROUTINE H&E STAIN

## 2023-10-10 NOTE — Progress Notes (Signed)
 Patient presents today for office visit and B12 injection. Patient tolerated injection in rt deltoid with no complaints voiced.  Site clean and dry with no bruising or swelling noted.  No complaints of pain.  Discharged with vital signs stable and no signs or symptoms of distress noted.

## 2023-10-10 NOTE — Assessment & Plan Note (Addendum)
 Likely secondary to iron deficiency  - Will replace with IV iron. - Patient unable to tolerate oral iron very well. - Continue to monitor counts

## 2023-10-10 NOTE — Assessment & Plan Note (Addendum)
 Low vitamin B12 level at 173.  Patient reported a history of probably pernicious anemia. Intrinsic factor: Negative, methylmalonic acid: Normal Currently getting vitamin B12 parenteral supplementation  - Continue vitamin B12 parenteral supplementation - Continue oral vitamin B12 daily.  Patient reports poor tolerance with upper GI symptoms.

## 2023-10-10 NOTE — Assessment & Plan Note (Addendum)
 Patient is s/p IV iron with significant improvement in iron levels.  Likely secondary to malabsorption Patient reports significant improvement in her fatigue and brain fog.  Recent endoscopy with polyps and gastric fundus, inflammation in the stomach but no evidence of bleeding Iron labs reviewed today consistent with iron deficiency  -Will administer 2 more doses of IV iron.  Reemphasized side effects including allergic reactions, nausea and headache.  Patient is in agreement. -Continue oral iron supplementation every other day.  Patient has poor tolerance with nausea vomiting. -Next colonoscopy due in 2026  Repeat labs in 3 months and reassess for the need of IV iron

## 2023-10-10 NOTE — Assessment & Plan Note (Signed)
 Low vitamin B12 level at 173.  Patient reported a history of probably pernicious anemia. -Will obtain intrinsic factor antibody  -Will start parenteral vitamin B12 supplementation -Repeat labs in 3 months

## 2023-10-10 NOTE — Patient Instructions (Signed)
 [facility] [place] - A DEPT OF [facility]  Discharge Instructions: Thank you for choosing [facility] to provide your oncology and hematology care.  If you have a lab appointment with the Cancer Center - please note that after April 8th, 2024, all labs will be drawn in the cancer center.  You do not have to check in or register with the main entrance as you have in the past but will complete your check-in in the cancer center.  Wear comfortable clothing and clothing appropriate for easy access to any Portacath or PICC line.   We strive to give you quality time with your provider. You may need to reschedule your appointment if you arrive late (15 or more minutes).  Arriving late affects you and other patients whose appointments are after yours.  Also, if you miss three or more appointments without notifying the office, you may be dismissed from the clinic at the provider's discretion.      For prescription refill requests, have your pharmacy contact our office and allow 72 hours for refills to be completed.    Today you received the following B12 injection.   Vitamin B12 Injection What is this medication? Vitamin B12 (VAHY tuh min B12) prevents and treats low vitamin B12 levels in your body. It is used in people who do not get enough vitamin B12 from their diet or when their digestive tract does not absorb enough. Vitamin B12 plays an important role in maintaining the health of your nervous system and red blood cells. This medicine may be used for other purposes; ask your health care provider or pharmacist if you have questions. COMMON BRAND NAME(S): B-12 Compliance Kit, B-12 Injection Kit, Cyomin, Dodex, LA-12, Nutri-Twelve, Physicians EZ Use B-12, Primabalt, Vitamin Deficiency Injectable System - B12 What should I tell my care team before I take this medication? They need to know if you have any of these conditions: Kidney disease Leber's  disease Megaloblastic anemia An unusual or allergic reaction to cyanocobalamin, cobalt, other medications, foods, dyes, or preservatives Pregnant or trying to get pregnant Breast-feeding How should I use this medication? This medication is injected into a muscle or deeply under the skin. It is usually given in a clinic or care team's office. However, your care team may teach you how to inject yourself. Follow all instructions. Talk to your care team about the use of this medication in children. Special care may be needed. Overdosage: If you think you have taken too much of this medicine contact a poison control center or emergency room at once. NOTE: This medicine is only for you. Do not share this medicine with others. What if I miss a dose? If you are given your dose at a clinic or care team's office, call to reschedule your appointment. If you give your own injections, and you miss a dose, take it as soon as you can. If it is almost time for your next dose, take only that dose. Do not take double or extra doses. What may interact with this medication? Alcohol Colchicine This list may not describe all possible interactions. Give your health care provider a list of all the medicines, herbs, non-prescription drugs, or dietary supplements you use. Also tell them if you smoke, drink alcohol, or use illegal drugs. Some items may interact with your medicine. What should I watch for while using this medication? Visit your care team regularly. You may need blood work done while you are  taking this medication. You may need to follow a special diet. Talk to your care team. Limit your alcohol intake and avoid smoking to get the best benefit. What side effects may I notice from receiving this medication? Side effects that you should report to your care team as soon as possible: Allergic reactions--skin rash, itching, hives, swelling of the face, lips, tongue, or throat Swelling of the ankles, hands, or  feet Trouble breathing Side effects that usually do not require medical attention (report to your care team if they continue or are bothersome): Diarrhea This list may not describe all possible side effects. Call your doctor for medical advice about side effects. You may report side effects to FDA at 1-800-FDA-1088. Where should I keep my medication? Keep out of the reach of children. Store at room temperature between 15 and 30 degrees C (59 and 85 degrees F). Protect from light. Throw away any unused medication after the expiration date. NOTE: This sheet is a summary. It may not cover all possible information. If you have questions about this medicine, talk to your doctor, pharmacist, or health care provider.  2024 Elsevier/Gold Standard (2020-11-09 00:00:00)    To help prevent nausea and vomiting after your treatment, we encourage you to take your nausea medication as directed.  BELOW ARE SYMPTOMS THAT SHOULD BE REPORTED IMMEDIATELY: *FEVER GREATER THAN 100.4 F (38 C) OR HIGHER *CHILLS OR SWEATING *NAUSEA AND VOMITING THAT IS NOT CONTROLLED WITH YOUR NAUSEA MEDICATION *UNUSUAL SHORTNESS OF BREATH *UNUSUAL BRUISING OR BLEEDING *URINARY PROBLEMS (pain or burning when urinating, or frequent urination) *BOWEL PROBLEMS (unusual diarrhea, constipation, pain near the anus) TENDERNESS IN MOUTH AND THROAT WITH OR WITHOUT PRESENCE OF ULCERS (sore throat, sores in mouth, or a toothache) UNUSUAL RASH, SWELLING OR PAIN  UNUSUAL VAGINAL DISCHARGE OR ITCHING   Items with * indicate a potential emergency and should be followed up as soon as possible or go to the Emergency Department if any problems should occur.  Please show the CHEMOTHERAPY ALERT CARD or IMMUNOTHERAPY ALERT CARD at check-in to the Emergency Department and triage nurse.  Should you have questions after your visit or need to cancel or reschedule your appointment, please contact [facility] [place] - A DEPT OF [facility] [phone]  and follow the prompts.  Office hours are 8:00 a.m. to 4:30 p.m. Monday - Friday. Please note that voicemails left after 4:00 p.m. may not be returned until the following business day.  We are closed weekends and major holidays. You have access to a nurse at all times for urgent questions. Please call the main number to the clinic [phone] and follow the prompts.  For any non-urgent questions, you may also contact your provider using MyChart. We now offer e-Visits for anyone 23 and older to request care online for non-urgent symptoms. For details visit [email or web address].   Also download the MyChart app! Go to the app store, search "MyChart", open the app, select [facility], and log in with your MyChart username and password.

## 2023-10-10 NOTE — Addendum Note (Signed)
 Addended by: Lakenzie Mcclafferty on: 10/10/2023 12:01 PM   Modules accepted: Orders

## 2023-10-26 ENCOUNTER — Inpatient Hospital Stay: Attending: Hematology

## 2023-10-26 VITALS — BP 161/85 | HR 64 | Temp 97.9°F | Resp 18

## 2023-10-26 DIAGNOSIS — D75839 Thrombocytosis, unspecified: Secondary | ICD-10-CM | POA: Insufficient documentation

## 2023-10-26 DIAGNOSIS — D509 Iron deficiency anemia, unspecified: Secondary | ICD-10-CM | POA: Insufficient documentation

## 2023-10-26 DIAGNOSIS — Z79899 Other long term (current) drug therapy: Secondary | ICD-10-CM | POA: Insufficient documentation

## 2023-10-26 DIAGNOSIS — E538 Deficiency of other specified B group vitamins: Secondary | ICD-10-CM | POA: Diagnosis not present

## 2023-10-26 DIAGNOSIS — D508 Other iron deficiency anemias: Secondary | ICD-10-CM

## 2023-10-26 MED ORDER — SODIUM CHLORIDE 0.9 % IV SOLN: Freq: Once | INTRAVENOUS | Status: AC

## 2023-10-26 MED ORDER — ACETAMINOPHEN 325 MG PO TABS
650.0000 mg | ORAL_TABLET | Freq: Once | ORAL | Status: AC
  Administered 2023-10-26: 650 mg via ORAL
  Filled 2023-10-26: qty 2

## 2023-10-26 MED ORDER — SODIUM CHLORIDE FLUSH 0.9 % IV SOLN
10.0000 mL | Freq: Once | INTRAVENOUS | Status: DC
  Filled 2023-10-26: qty 10

## 2023-10-26 MED ORDER — SODIUM CHLORIDE 0.9 % IV SOLN
510.0000 mg | Freq: Once | INTRAVENOUS | Status: AC
  Administered 2023-10-26: 510 mg via INTRAVENOUS
  Filled 2023-10-26: qty 510

## 2023-10-26 MED ORDER — CETIRIZINE HCL 10 MG PO TABS
10.0000 mg | ORAL_TABLET | Freq: Once | ORAL | Status: AC
  Administered 2023-10-26: 10 mg via ORAL
  Filled 2023-10-26: qty 1

## 2023-10-26 NOTE — Patient Instructions (Signed)

## 2023-10-26 NOTE — Progress Notes (Signed)
 Patient tolerated iron infusion with no complaints voiced.  Peripheral IV site clean and dry with good blood return noted before and after infusion.  Band aid applied.  VSS with discharge and left in satisfactory condition with no s/s of distress noted.

## 2023-11-02 ENCOUNTER — Inpatient Hospital Stay

## 2023-11-02 VITALS — BP 137/69 | HR 63 | Temp 98.3°F | Resp 18

## 2023-11-02 DIAGNOSIS — Z79899 Other long term (current) drug therapy: Secondary | ICD-10-CM | POA: Diagnosis not present

## 2023-11-02 DIAGNOSIS — D75839 Thrombocytosis, unspecified: Secondary | ICD-10-CM | POA: Diagnosis not present

## 2023-11-02 DIAGNOSIS — D509 Iron deficiency anemia, unspecified: Secondary | ICD-10-CM | POA: Diagnosis not present

## 2023-11-02 DIAGNOSIS — E538 Deficiency of other specified B group vitamins: Secondary | ICD-10-CM | POA: Diagnosis not present

## 2023-11-02 DIAGNOSIS — D508 Other iron deficiency anemias: Secondary | ICD-10-CM

## 2023-11-02 MED ORDER — SODIUM CHLORIDE 0.9 % IV SOLN
510.0000 mg | Freq: Once | INTRAVENOUS | Status: AC
  Administered 2023-11-02: 510 mg via INTRAVENOUS
  Filled 2023-11-02: qty 510

## 2023-11-02 MED ORDER — SODIUM CHLORIDE 0.9 % IV SOLN: Freq: Once | INTRAVENOUS | Status: AC

## 2023-11-02 NOTE — Progress Notes (Signed)
 Patient presents today for Feraheme  infusion.  Vital signs stable. Patient denies any side effects related to last iron. No concerns noted at this time. Patient took premedications prior to arrival.   Feraheme  given today per MD orders. Tolerated infusion without adverse affects. Vital signs stable. No complaints at this time. Discharged from clinic ambulatory in stable condition. Alert and oriented x 3. F/U with [facility] as scheduled.

## 2023-11-07 ENCOUNTER — Encounter: Payer: Self-pay | Admitting: Oncology

## 2023-11-09 ENCOUNTER — Inpatient Hospital Stay

## 2023-11-09 ENCOUNTER — Encounter: Payer: Self-pay | Admitting: Oncology

## 2023-11-09 VITALS — BP 150/70 | HR 67 | Temp 97.1°F | Resp 18

## 2023-11-09 DIAGNOSIS — D509 Iron deficiency anemia, unspecified: Secondary | ICD-10-CM | POA: Diagnosis not present

## 2023-11-09 DIAGNOSIS — D508 Other iron deficiency anemias: Secondary | ICD-10-CM

## 2023-11-09 DIAGNOSIS — Z79899 Other long term (current) drug therapy: Secondary | ICD-10-CM | POA: Diagnosis not present

## 2023-11-09 DIAGNOSIS — D75839 Thrombocytosis, unspecified: Secondary | ICD-10-CM | POA: Diagnosis not present

## 2023-11-09 DIAGNOSIS — E538 Deficiency of other specified B group vitamins: Secondary | ICD-10-CM | POA: Diagnosis not present

## 2023-11-09 MED ORDER — CYANOCOBALAMIN 1000 MCG/ML IJ SOLN
1000.0000 ug | Freq: Once | INTRAMUSCULAR | Status: AC
  Administered 2023-11-09: 1000 ug via INTRAMUSCULAR
  Filled 2023-11-09: qty 1

## 2023-11-09 NOTE — Progress Notes (Signed)
 Patient presents today for B12 injection. Patient tolerated injection with no complaints voiced.  Site clean and dry with no bruising or swelling noted.  No complaints of pain.  Discharged with vital signs stable and no signs or symptoms of distress noted.

## 2023-11-09 NOTE — Patient Instructions (Signed)
 [facility] [place] - A DEPT OF [facility]  Discharge Instructions: Thank you for choosing [facility] to provide your oncology and hematology care.  If you have a lab appointment with the Cancer Center - please note that after April 8th, 2024, all labs will be drawn in the cancer center.  You do not have to check in or register with the main entrance as you have in the past but will complete your check-in in the cancer center.  Wear comfortable clothing and clothing appropriate for easy access to any Portacath or PICC line.   We strive to give you quality time with your provider. You may need to reschedule your appointment if you arrive late (15 or more minutes).  Arriving late affects you and other patients whose appointments are after yours.  Also, if you miss three or more appointments without notifying the office, you may be dismissed from the clinic at the provider's discretion.      For prescription refill requests, have your pharmacy contact our office and allow 72 hours for refills to be completed.    Today you received the following B12 injection.   Vitamin B12 Injection What is this medication? Vitamin B12 (VAHY tuh min B12) prevents and treats low vitamin B12 levels in your body. It is used in people who do not get enough vitamin B12 from their diet or when their digestive tract does not absorb enough. Vitamin B12 plays an important role in maintaining the health of your nervous system and red blood cells. This medicine may be used for other purposes; ask your health care provider or pharmacist if you have questions. COMMON BRAND NAME(S): B-12 Compliance Kit, B-12 Injection Kit, Cyomin, Dodex, LA-12, Nutri-Twelve, Physicians EZ Use B-12, Primabalt, Vitamin Deficiency Injectable System - B12 What should I tell my care team before I take this medication? They need to know if you have any of these conditions: Kidney disease Leber's  disease Megaloblastic anemia An unusual or allergic reaction to cyanocobalamin, cobalt, other medications, foods, dyes, or preservatives Pregnant or trying to get pregnant Breast-feeding How should I use this medication? This medication is injected into a muscle or deeply under the skin. It is usually given in a clinic or care team's office. However, your care team may teach you how to inject yourself. Follow all instructions. Talk to your care team about the use of this medication in children. Special care may be needed. Overdosage: If you think you have taken too much of this medicine contact a poison control center or emergency room at once. NOTE: This medicine is only for you. Do not share this medicine with others. What if I miss a dose? If you are given your dose at a clinic or care team's office, call to reschedule your appointment. If you give your own injections, and you miss a dose, take it as soon as you can. If it is almost time for your next dose, take only that dose. Do not take double or extra doses. What may interact with this medication? Alcohol Colchicine This list may not describe all possible interactions. Give your health care provider a list of all the medicines, herbs, non-prescription drugs, or dietary supplements you use. Also tell them if you smoke, drink alcohol, or use illegal drugs. Some items may interact with your medicine. What should I watch for while using this medication? Visit your care team regularly. You may need blood work done while you are  taking this medication. You may need to follow a special diet. Talk to your care team. Limit your alcohol intake and avoid smoking to get the best benefit. What side effects may I notice from receiving this medication? Side effects that you should report to your care team as soon as possible: Allergic reactions--skin rash, itching, hives, swelling of the face, lips, tongue, or throat Swelling of the ankles, hands, or  feet Trouble breathing Side effects that usually do not require medical attention (report to your care team if they continue or are bothersome): Diarrhea This list may not describe all possible side effects. Call your doctor for medical advice about side effects. You may report side effects to FDA at 1-800-FDA-1088. Where should I keep my medication? Keep out of the reach of children. Store at room temperature between 15 and 30 degrees C (59 and 85 degrees F). Protect from light. Throw away any unused medication after the expiration date. NOTE: This sheet is a summary. It may not cover all possible information. If you have questions about this medicine, talk to your doctor, pharmacist, or health care provider.  2024 Elsevier/Gold Standard (2020-11-09 00:00:00)    To help prevent nausea and vomiting after your treatment, we encourage you to take your nausea medication as directed.  BELOW ARE SYMPTOMS THAT SHOULD BE REPORTED IMMEDIATELY: *FEVER GREATER THAN 100.4 F (38 C) OR HIGHER *CHILLS OR SWEATING *NAUSEA AND VOMITING THAT IS NOT CONTROLLED WITH YOUR NAUSEA MEDICATION *UNUSUAL SHORTNESS OF BREATH *UNUSUAL BRUISING OR BLEEDING *URINARY PROBLEMS (pain or burning when urinating, or frequent urination) *BOWEL PROBLEMS (unusual diarrhea, constipation, pain near the anus) TENDERNESS IN MOUTH AND THROAT WITH OR WITHOUT PRESENCE OF ULCERS (sore throat, sores in mouth, or a toothache) UNUSUAL RASH, SWELLING OR PAIN  UNUSUAL VAGINAL DISCHARGE OR ITCHING   Items with * indicate a potential emergency and should be followed up as soon as possible or go to the Emergency Department if any problems should occur.  Please show the CHEMOTHERAPY ALERT CARD or IMMUNOTHERAPY ALERT CARD at check-in to the Emergency Department and triage nurse.  Should you have questions after your visit or need to cancel or reschedule your appointment, please contact [facility] [place] - A DEPT OF [facility] [phone]  and follow the prompts.  Office hours are 8:00 a.m. to 4:30 p.m. Monday - Friday. Please note that voicemails left after 4:00 p.m. may not be returned until the following business day.  We are closed weekends and major holidays. You have access to a nurse at all times for urgent questions. Please call the main number to the clinic [phone] and follow the prompts.  For any non-urgent questions, you may also contact your provider using MyChart. We now offer e-Visits for anyone 23 and older to request care online for non-urgent symptoms. For details visit [email or web address].   Also download the MyChart app! Go to the app store, search "MyChart", open the app, select [facility], and log in with your MyChart username and password.

## 2023-11-22 ENCOUNTER — Ambulatory Visit: Admitting: Physician Assistant

## 2023-11-29 ENCOUNTER — Ambulatory Visit (INDEPENDENT_AMBULATORY_CARE_PROVIDER_SITE_OTHER): Payer: Self-pay | Admitting: Audiology

## 2023-11-29 ENCOUNTER — Ambulatory Visit (INDEPENDENT_AMBULATORY_CARE_PROVIDER_SITE_OTHER): Payer: BC Managed Care – PPO | Admitting: Otolaryngology

## 2023-11-29 VITALS — BP 149/77 | HR 76 | Temp 98.0°F

## 2023-11-29 DIAGNOSIS — H903 Sensorineural hearing loss, bilateral: Secondary | ICD-10-CM

## 2023-11-29 DIAGNOSIS — H9319 Tinnitus, unspecified ear: Secondary | ICD-10-CM | POA: Diagnosis not present

## 2023-11-29 DIAGNOSIS — H9313 Tinnitus, bilateral: Secondary | ICD-10-CM | POA: Insufficient documentation

## 2023-11-29 NOTE — Progress Notes (Signed)
 Patient ID: Shelley [name], female   DOB: October 17, 1955, 68 y.o.   MRN: [id number]  Follow-up: Hearing loss, tinnitus  HPI: The patient is a 68 year old female who returns today for follow-up of her hearing loss and tinnitus.  According to the patient, she has been experiencing bilateral progressive hearing loss for 20+ years.  She also has bilateral constant high-pitched tinnitus.  She was fitted with bilateral hearing aids last year.  Her tinnitus has since improved.  She denies any recent change in her hearing.  She denies any otalgia, otorrhea, or vertigo.  Exam: General: Communicates without difficulty, well nourished, no acute distress. Head: Normocephalic, no evidence injury, no tenderness, facial buttresses intact without stepoff. Face/sinus: No tenderness to palpation and percussion. Facial movement is normal and symmetric. Eyes: PERRL, EOMI. No scleral icterus, conjunctivae clear. Neuro: CN II exam reveals vision grossly intact.  No nystagmus at any point of gaze. Ears: Auricles well formed without lesions.  Ear canals are intact without mass or lesion.  No erythema or edema is appreciated.  The TMs are intact without fluid. Nose: External evaluation reveals normal support and skin without lesions.  Dorsum is intact.  Anterior rhinoscopy reveals normal mucosa over anterior aspect of inferior turbinates and intact septum.  No purulence noted. Oral:  Oral cavity and oropharynx are intact, symmetric, without erythema or edema.  Mucosa is moist without lesions. Neck: Full range of motion without pain.  There is no significant lymphadenopathy.  No masses palpable.  Thyroid  bed within normal limits to palpation.  Parotid glands and submandibular glands equal bilaterally without mass.  Trachea is midline. Neuro:  CN 2-12 grossly intact.   Exam: 1.  Subjectively stable bilateral high-frequency sensorineural hearing loss, likely secondary to routine presbycusis. 2.  The patient's tinnitus is likely a  direct result of the hearing loss.  3.  The patient's ear canals, tympanic membranes, and middle ear spaces are normal.   Plan: 1.  The physical exam findings are reviewed with the patient. 2.  Continue the use of her hearing aids. 3.  The strategies to cope with tinnitus, including the use of masker, hearing aids, tinnitus retraining therapy, and avoidance of caffeine and alcohol are discussed.  4.  The patient will return for reevaluation in 1 year.

## 2023-11-29 NOTE — Progress Notes (Signed)
  [address] [place], [place] [address] [phone]  Hearing Aid Check     Shelley [name] comes for a scheduled appointment for a hearing aid check.  Accompanied ab:[id number]   Right Left  Hearing aid manufacturer Oticon Intent 3 miniRITE SN: [id number] O[employer]on Intent 3 miniRITE SN: [id number]  Hearing aid style Receiver in the ear Receiver in the ear  Hearing aid battery rechargeable rechargeable  Receiver    Dome/ custom earpiece 6mm open bass 6 mm open bass  Retention wire no no  Warranty expiration date 01-19-2026 01-19-2026  Loss and Damage unknown unknown  Initial fitting date 12-27-2022 12-27-2022  [facility]    Chief complaint: Patient reports needs aids to be cleaned because she cannot put the dome back into the receiver.  Actions taken: Inspection of the device and listening check showed that they worked well after replacing domes and wax filters and cleaning microphones.  Services fee: $0 was paid at checkout.  Patient says she thought she had an 8mm open dome in the right ear. I replaced the domes with the same style and size as what she was wearing when she came in which also matches what Dr. Kip note from 12-27-22. indicates.   Recommend: Return for a hearing aid check , as needed. Return for a hearing evaluation and to see an ENT, if concerns with hearing changes arise.    Admir Candelas Shelley [name], AUD

## 2023-12-04 ENCOUNTER — Encounter: Payer: Self-pay | Admitting: Pediatrics

## 2023-12-05 NOTE — Progress Notes (Unsigned)
 Shelley [name] [birth year] [id number]   History:  68 y.o. G2 P0011 presents for annual exam. Postmenopausal - no HRT. S/P 2003 supracervical hysterectomy with BSO for fibroids and endometriosis.  Using vaginal estrogen externally for dryness/atrophy 2-3 times weekly. Normal pap history. Primary care manages hypertension, GERD, HLD.    Gynecologic History No LMP recorded. Patient has had a hysterectomy.   Contraception: status post hysterectomy Sexually active: No  Health Maintenace Last Pap: 11/30/2022. Results were: Normal neg HPV Last mammogram: 11/30/2022. Results were: Normal Last colonoscopy: 02/09/2020. Results were: Tubular adenoma, 5-year recall Last Dexa: 01/31/2022. Results were: T-score -1.6, FRAX 8.9% / 1.0%  Past medical history, past surgical history, family history and social history were all reviewed and documented in the EPIC chart. Works for [employer], over Field seismologist. Daughter working on PhD, teaching at [employer], baby girl due in January. Mother with history of breast cancer at age 12 and colon cancer at age 22.  ROS:  A ROS was performed and pertinent positives and negatives are included.  Exam:  There were no vitals filed for this visit.     There is no height or weight on file to calculate BMI.  General appearance:  Normal Thyroid :  Symmetrical, normal in size, without palpable masses or nodularity. Respiratory  Auscultation:  Clear without wheezing or rhonchi Cardiovascular  Auscultation:  Regular rate, without rubs, murmurs or gallops  Edema/varicosities:  Not grossly evident Abdominal  Soft,nontender, without masses, guarding or rebound.  Liver/spleen:  No organomegaly noted  Hernia:  None appreciated  Skin  Inspection:  Grossly normal   Breasts: Examined lying and sitting.   Right: Without masses, retractions, discharge or axillary adenopathy.   Left: Without masses, retractions, discharge or axillary adenopathy. Pelvic: External genitalia:  no  lesions              Urethra:  normal appearing urethra with no masses, tenderness or lesions              Bartholins and Skenes: normal                 Vagina: normal appearing vagina with normal color and discharge, no lesions. Atrophic changes              Cervix: no lesions Bimanual Exam:  Uterus: absent              Adnexa: no mass, fullness, tenderness              Rectovaginal: Deferred              Anus:  normal, no lesions  Patient informed chaperone available to be present for breast and pelvic exam. Patient has requested no chaperone to be present. Patient has been advised what will be completed during breast and pelvic exam.   Assessment/Plan:  68 y.o. G2 P0011 for annual exam.    Well female exam with routine gynecological exam - Education provided on SBEs, importance of preventative screenings, current guidelines, high calcium  diet, regular exercise, and multivitamin daily. Labs through work.   Menopausal vaginal dryness - Plan: estradiol  (ESTRACE  VAGINAL) 0.1 MG/GM vaginal cream externally twice weekly. Good management.    Postmenopausal - No HRT. S/P 2003 supracervical TAH/BSO for fibroids and endometriosis. Denies menopausal symptoms.   Screening for cervical cancer - Normal pap history. No longer screening per guidelines.   Osteopenia of multiple sites - T-score -1.6 without elevated FRAX. Vit D supplement, high calcium  diet and regular exercise.   Screening  for breast cancer - Normal mammogram history.  Continue annual screenings.  Normal breast exam today. Mother with history of breast cancer.   Screening for colon cancer - 2021 colonoscopy. Will repeat at GI's recommended interval. Mother with history of colon cancer.   No follow-ups on file.       Shelley [name] [facility], 2:44 PM 12/05/2023

## 2023-12-06 ENCOUNTER — Ambulatory Visit (INDEPENDENT_AMBULATORY_CARE_PROVIDER_SITE_OTHER): Payer: BC Managed Care – PPO | Admitting: Nurse Practitioner

## 2023-12-06 ENCOUNTER — Encounter: Payer: Self-pay | Admitting: Nurse Practitioner

## 2023-12-06 VITALS — BP 124/80 | HR 70 | Resp 16 | Ht 64.25 in | Wt 173.0 lb

## 2023-12-06 DIAGNOSIS — Z780 Asymptomatic menopausal state: Secondary | ICD-10-CM

## 2023-12-06 DIAGNOSIS — Z1331 Encounter for screening for depression: Secondary | ICD-10-CM

## 2023-12-06 DIAGNOSIS — Z1231 Encounter for screening mammogram for malignant neoplasm of breast: Secondary | ICD-10-CM | POA: Diagnosis not present

## 2023-12-06 DIAGNOSIS — M8589 Other specified disorders of bone density and structure, multiple sites: Secondary | ICD-10-CM | POA: Diagnosis not present

## 2023-12-06 DIAGNOSIS — Z01419 Encounter for gynecological examination (general) (routine) without abnormal findings: Secondary | ICD-10-CM

## 2023-12-06 DIAGNOSIS — N951 Menopausal and female climacteric states: Secondary | ICD-10-CM | POA: Diagnosis not present

## 2023-12-06 LAB — HM MAMMOGRAPHY

## 2023-12-06 MED ORDER — ESTRADIOL 0.1 MG/GM VA CREA: TOPICAL_CREAM | VAGINAL | 1 refills | Status: AC

## 2023-12-07 ENCOUNTER — Encounter: Payer: Self-pay | Admitting: Nurse Practitioner

## 2023-12-10 ENCOUNTER — Inpatient Hospital Stay: Attending: Hematology

## 2023-12-10 VITALS — BP 145/66 | HR 72 | Temp 98.8°F | Resp 18

## 2023-12-10 DIAGNOSIS — D510 Vitamin B12 deficiency anemia due to intrinsic factor deficiency: Secondary | ICD-10-CM | POA: Diagnosis not present

## 2023-12-10 DIAGNOSIS — D508 Other iron deficiency anemias: Secondary | ICD-10-CM

## 2023-12-10 MED ORDER — CYANOCOBALAMIN 1000 MCG/ML IJ SOLN
1000.0000 ug | Freq: Once | INTRAMUSCULAR | Status: AC
  Administered 2023-12-10: 1000 ug via INTRAMUSCULAR
  Filled 2023-12-10: qty 1

## 2023-12-10 NOTE — Patient Instructions (Signed)
 [facility] [place] - A DEPT OF [place]  Discharge Instructions: Thank you for choosing [facility] to provide your oncology and hematology care.  If you have a lab appointment with the Cancer Center - please note that after April 8th, 2024, all labs will be drawn in the cancer center.  You do not have to check in or register with the main entrance as you have in the past but will complete your check-in in the cancer center.  Wear comfortable clothing and clothing appropriate for easy access to any Portacath or PICC line.   We strive to give you quality time with your provider. You may need to reschedule your appointment if you arrive late (15 or more minutes).  Arriving late affects you and other patients whose appointments are after yours.  Also, if you miss three or more appointments without notifying the office, you may be dismissed from the clinic at the provider's discretion.      For prescription refill requests, have your pharmacy contact our office and allow 72 hours for refills to be completed.    Today you received the following chemotherapy and/or immunotherapy agents Vitamin B12. Vitamin B12 Injection What is this medication? Vitamin B12 (VAHY tuh min B12) prevents and treats low vitamin B12 levels in your body. It is used in people who do not get enough vitamin B12 from their diet or when their digestive tract does not absorb enough. Vitamin B12 plays an important role in maintaining the health of your nervous system and red blood cells. This medicine may be used for other purposes; ask your health care provider or pharmacist if you have questions. COMMON BRAND NAME(S): B-12 Compliance Kit, B-12 Injection Kit, Cyomin, Dodex , LA-12, Nutri-Twelve, Physicians EZ Use B-12, Primabalt, Vitamin Deficiency Injectable System - B12 What should I tell my care team before I take this medication? They need to know if you have any of these  conditions: Kidney disease Leber's disease Megaloblastic anemia An unusual or allergic reaction to cyanocobalamin , cobalt, other medications, foods, dyes, or preservatives Pregnant or trying to get pregnant Breast-feeding How should I use this medication? This medication is injected into a muscle or deeply under the skin. It is usually given in a clinic or care team's office. However, your care team may teach you how to inject yourself. Follow all instructions. Talk to your care team about the use of this medication in children. Special care may be needed. Overdosage: If you think you have taken too much of this medicine contact a poison control center or emergency room at once. NOTE: This medicine is only for you. Do not share this medicine with others. What if I miss a dose? If you are given your dose at a clinic or care team's office, call to reschedule your appointment. If you give your own injections, and you miss a dose, take it as soon as you can. If it is almost time for your next dose, take only that dose. Do not take double or extra doses. What may interact with this medication? Alcohol Colchicine This list may not describe all possible interactions. Give your health care provider a list of all the medicines, herbs, non-prescription drugs, or dietary supplements you use. Also tell them if you smoke, drink alcohol, or use illegal drugs. Some items may interact with your medicine. What should I watch for while using this medication? Visit your care team regularly. You may need blood work done while  you are taking this medication. You may need to follow a special diet. Talk to your care team. Limit your alcohol intake and avoid smoking to get the best benefit. What side effects may I notice from receiving this medication? Side effects that you should report to your care team as soon as possible: Allergic reactions--skin rash, itching, hives, swelling of the face, lips, tongue, or  throat Swelling of the ankles, hands, or feet Trouble breathing Side effects that usually do not require medical attention (report to your care team if they continue or are bothersome): Diarrhea This list may not describe all possible side effects. Call your doctor for medical advice about side effects. You may report side effects to FDA at 1-800-FDA-1088. Where should I keep my medication? Keep out of the reach of children. Store at room temperature between 15 and 30 degrees C (59 and 85 degrees F). Protect from light. Throw away any unused medication after the expiration date. NOTE: This sheet is a summary. It may not cover all possible information. If you have questions about this medicine, talk to your doctor, pharmacist, or health care provider.  2024 Elsevier/Gold Standard (2020-11-09 00:00:00)      To help prevent nausea and vomiting after your treatment, we encourage you to take your nausea medication as directed.  BELOW ARE SYMPTOMS THAT SHOULD BE REPORTED IMMEDIATELY: *FEVER GREATER THAN 100.4 F (38 C) OR HIGHER *CHILLS OR SWEATING *NAUSEA AND VOMITING THAT IS NOT CONTROLLED WITH YOUR NAUSEA MEDICATION *UNUSUAL SHORTNESS OF BREATH *UNUSUAL BRUISING OR BLEEDING *URINARY PROBLEMS (pain or burning when urinating, or frequent urination) *BOWEL PROBLEMS (unusual diarrhea, constipation, pain near the anus) TENDERNESS IN MOUTH AND THROAT WITH OR WITHOUT PRESENCE OF ULCERS (sore throat, sores in mouth, or a toothache) UNUSUAL RASH, SWELLING OR PAIN  UNUSUAL VAGINAL DISCHARGE OR ITCHING   Items with * indicate a potential emergency and should be followed up as soon as possible or go to the Emergency Department if any problems should occur.  Please show the CHEMOTHERAPY ALERT CARD or IMMUNOTHERAPY ALERT CARD at check-in to the Emergency Department and triage nurse.  Should you have questions after your visit or need to cancel or reschedule your appointment, please contact [facility] CANCER  [facility] [place] - A DEPT OF [facility] [phone]  and follow the prompts.  Office hours are 8:00 a.m. to 4:30 p.m. Monday - Friday. Please note that voicemails left after 4:00 p.m. may not be returned until the following business day.  We are closed weekends and major holidays. You have access to a nurse at all times for urgent questions. Please call the main number to the clinic [phone] and follow the prompts.  For any non-urgent questions, you may also contact your provider using MyChart. We now offer e-Visits for anyone 38 and older to request care online for non-urgent symptoms. For details visit [email or web address].   Also download the MyChart app! Go to the app store, search "MyChart", open the app, select [place], and log in with your MyChart username and password.

## 2023-12-10 NOTE — Progress Notes (Signed)
 Shelley [name] presents today for injection per the provider's orders.  B12 administration without incident; injection site WNL; see MAR for injection details.  Patient tolerated procedure well and without incident.  No questions or concerns noted. Discharged from clinic ambulatory in stable condition. Alert and oriented x 3. F/U with [facility] as scheduled.

## 2023-12-13 DIAGNOSIS — H40053 Ocular hypertension, bilateral: Secondary | ICD-10-CM | POA: Diagnosis not present

## 2023-12-14 DIAGNOSIS — H26493 Other secondary cataract, bilateral: Secondary | ICD-10-CM | POA: Diagnosis not present

## 2023-12-14 DIAGNOSIS — I10 Essential (primary) hypertension: Secondary | ICD-10-CM | POA: Diagnosis not present

## 2023-12-14 DIAGNOSIS — H40051 Ocular hypertension, right eye: Secondary | ICD-10-CM | POA: Diagnosis not present

## 2023-12-14 DIAGNOSIS — H26492 Other secondary cataract, left eye: Secondary | ICD-10-CM | POA: Diagnosis not present

## 2023-12-14 DIAGNOSIS — H40013 Open angle with borderline findings, low risk, bilateral: Secondary | ICD-10-CM | POA: Diagnosis not present

## 2023-12-28 DIAGNOSIS — Q613 Polycystic kidney, unspecified: Secondary | ICD-10-CM | POA: Diagnosis not present

## 2023-12-28 DIAGNOSIS — E7800 Pure hypercholesterolemia, unspecified: Secondary | ICD-10-CM | POA: Diagnosis not present

## 2023-12-28 DIAGNOSIS — Z299 Encounter for prophylactic measures, unspecified: Secondary | ICD-10-CM | POA: Diagnosis not present

## 2023-12-28 DIAGNOSIS — I10 Essential (primary) hypertension: Secondary | ICD-10-CM | POA: Diagnosis not present

## 2023-12-28 DIAGNOSIS — E1169 Type 2 diabetes mellitus with other specified complication: Secondary | ICD-10-CM | POA: Diagnosis not present

## 2024-01-02 DIAGNOSIS — E871 Hypo-osmolality and hyponatremia: Secondary | ICD-10-CM | POA: Diagnosis not present

## 2024-01-06 ENCOUNTER — Encounter: Payer: Self-pay | Admitting: Oncology

## 2024-01-09 ENCOUNTER — Inpatient Hospital Stay: Attending: Hematology

## 2024-01-09 ENCOUNTER — Inpatient Hospital Stay

## 2024-01-09 VITALS — BP 148/62 | HR 75 | Temp 97.3°F | Resp 18

## 2024-01-09 DIAGNOSIS — D510 Vitamin B12 deficiency anemia due to intrinsic factor deficiency: Secondary | ICD-10-CM | POA: Diagnosis present

## 2024-01-09 DIAGNOSIS — Z79899 Other long term (current) drug therapy: Secondary | ICD-10-CM | POA: Insufficient documentation

## 2024-01-09 DIAGNOSIS — D508 Other iron deficiency anemias: Secondary | ICD-10-CM

## 2024-01-09 DIAGNOSIS — E538 Deficiency of other specified B group vitamins: Secondary | ICD-10-CM

## 2024-01-09 LAB — CBC WITH DIFFERENTIAL/PLATELET
Abs Immature Granulocytes: 0.02 K/uL (ref 0.00–0.07)
Basophils Absolute: 0 K/uL (ref 0.0–0.1)
Basophils Relative: 1 %
Eosinophils Absolute: 0.2 K/uL (ref 0.0–0.5)
Eosinophils Relative: 3 %
HCT: 42.7 % (ref 36.0–46.0)
Hemoglobin: 14.2 g/dL (ref 12.0–15.0)
Immature Granulocytes: 0 %
Lymphocytes Relative: 27 %
Lymphs Abs: 1.6 K/uL (ref 0.7–4.0)
MCH: 31.2 pg (ref 26.0–34.0)
MCHC: 33.3 g/dL (ref 30.0–36.0)
MCV: 93.8 fL (ref 80.0–100.0)
Monocytes Absolute: 0.5 K/uL (ref 0.1–1.0)
Monocytes Relative: 8 %
Neutro Abs: 3.5 K/uL (ref 1.7–7.7)
Neutrophils Relative %: 61 %
Platelets: 371 K/uL (ref 150–400)
RBC: 4.55 MIL/uL (ref 3.87–5.11)
RDW: 13.5 % (ref 11.5–15.5)
WBC: 5.8 K/uL (ref 4.0–10.5)
nRBC: 0 % (ref 0.0–0.2)

## 2024-01-09 LAB — FERRITIN: Ferritin: 141 ng/mL (ref 11–307)

## 2024-01-09 LAB — IRON AND TIBC
Iron: 83 ug/dL (ref 28–170)
Saturation Ratios: 18 % (ref 10.4–31.8)
TIBC: 461 ug/dL — ABNORMAL HIGH (ref 250–450)
UIBC: 378 ug/dL

## 2024-01-09 LAB — COMPREHENSIVE METABOLIC PANEL WITH GFR
ALT: 19 U/L (ref 0–44)
AST: 25 U/L (ref 15–41)
Albumin: 4.8 g/dL (ref 3.5–5.0)
Alkaline Phosphatase: 47 U/L (ref 38–126)
Anion gap: 11 (ref 5–15)
BUN: 18 mg/dL (ref 8–23)
CO2: 27 mmol/L (ref 22–32)
Calcium: 10.3 mg/dL (ref 8.9–10.3)
Chloride: 96 mmol/L — ABNORMAL LOW (ref 98–111)
Creatinine, Ser: 0.78 mg/dL (ref 0.44–1.00)
GFR, Estimated: >60 mL/min (ref >60)
Glucose, Bld: 168 mg/dL — ABNORMAL HIGH (ref 70–99)
Potassium: 4.3 mmol/L (ref 3.5–5.1)
Sodium: 134 mmol/L — ABNORMAL LOW (ref 135–145)
Total Bilirubin: 0.2 mg/dL (ref 0.0–1.2)
Total Protein: 7.1 g/dL (ref 6.5–8.1)

## 2024-01-09 LAB — FOLATE: Folate: 8.6 ng/mL (ref >5.9)

## 2024-01-09 LAB — VITAMIN B12: Vitamin B-12: 415 pg/mL (ref 180–914)

## 2024-01-09 MED ORDER — CYANOCOBALAMIN 1000 MCG/ML IJ SOLN
1000.0000 ug | Freq: Once | INTRAMUSCULAR | Status: AC
  Administered 2024-01-09: 1000 ug via INTRAMUSCULAR
  Filled 2024-01-09: qty 1

## 2024-01-09 NOTE — Progress Notes (Signed)
 Patient tolerated injection with no complaints voiced. Site clean and dry with no bruising or swelling noted at site. See MAR for details. Band aid applied.  Patient stable during and after injection. VSS with discharge and left in satisfactory condition with no s/s of distress noted.

## 2024-01-09 NOTE — Patient Instructions (Signed)
 [facility] [place] - A DEPT OF [facility]  Discharge Instructions: Thank you for choosing [facility] to provide your oncology and hematology care.  If you have a lab appointment with the Cancer Center - please note that after April 8th, 2024, all labs will be drawn in the cancer center.  You do not have to check in or register with the main entrance as you have in the past but will complete your check-in in the cancer center.  Wear comfortable clothing and clothing appropriate for easy access to any Portacath or PICC line.   We strive to give you quality time with your provider. You may need to reschedule your appointment if you arrive late (15 or more minutes).  Arriving late affects you and other patients whose appointments are after yours.  Also, if you miss three or more appointments without notifying the office, you may be dismissed from the clinic at the provider's discretion.      For prescription refill requests, have your pharmacy contact our office and allow 72 hours for refills to be completed.    Today you received the following B12 return as scheduled.   To help prevent nausea and vomiting after your treatment, we encourage you to take your nausea medication as directed.  BELOW ARE SYMPTOMS THAT SHOULD BE REPORTED IMMEDIATELY: *FEVER GREATER THAN 100.4 F (38 C) OR HIGHER *CHILLS OR SWEATING *NAUSEA AND VOMITING THAT IS NOT CONTROLLED WITH YOUR NAUSEA MEDICATION *UNUSUAL SHORTNESS OF BREATH *UNUSUAL BRUISING OR BLEEDING *URINARY PROBLEMS (pain or burning when urinating, or frequent urination) *BOWEL PROBLEMS (unusual diarrhea, constipation, pain near the anus) TENDERNESS IN MOUTH AND THROAT WITH OR WITHOUT PRESENCE OF ULCERS (sore throat, sores in mouth, or a toothache) UNUSUAL RASH, SWELLING OR PAIN  UNUSUAL VAGINAL DISCHARGE OR ITCHING   Items with * indicate a potential emergency and should be followed up as soon as possible or go to  the Emergency Department if any problems should occur.  Please show the CHEMOTHERAPY ALERT CARD or IMMUNOTHERAPY ALERT CARD at check-in to the Emergency Department and triage nurse.  Should you have questions after your visit or need to cancel or reschedule your appointment, please contact [facility]ER [facility] [place] - A DEPT OF [facility] [phone]  and follow the prompts.  Office hours are 8:00 a.m. to 4:30 p.m. Monday - Friday. Please note that voicemails left after 4:00 p.m. may not be returned until the following business day.  We are closed weekends and major holidays. You have access to a nurse at all times for urgent questions. Please call the main number to the clinic [phone] and follow the prompts.  For any non-urgent questions, you may also contact your provider using MyChart. We now offer e-Visits for anyone 33 and older to request care online for non-urgent symptoms. For details visit [email or web address].   Also download the MyChart app! Go to the app store, search "MyChart", open the app, select [place], and log in with your MyChart username and password.

## 2024-01-10 ENCOUNTER — Encounter: Payer: Self-pay | Admitting: Oncology

## 2024-01-15 NOTE — Progress Notes (Unsigned)
 [facility] [address], [place] [address]   CLINIC:  Medical Oncology/Hematology  PCP:  Patient, No Pcp Per No address on file None   REASON FOR VISIT:  Follow-up for iron deficiency anemia  PRIOR THERAPY: Oral iron and oral B12 (Difficulty tolerating due to nausea and vomiting)   CURRENT THERAPY: IV iron as needed + monthly B12 injections  INTERVAL HISTORY:   Shelley [name] 68 y.o. female returns for routine follow-up of iron deficiency anemia. She was last seen by Dr. Davonna on 10/10/2023. She received Feraheme  x 2 in August 2025.  At today's visit, she reports feeling fairly well.   No recent hospitalizations, surgeries, or changes in baseline health status. She has 75% energy and 100% appetite. She endorses that she is maintaining a stable weight.  She denies any rectal bleeding or melena. No fatigue, pica, headaches, lightheadedness, syncope. No chest pain or dyspnea on exertion.  ASSESSMENT & PLAN:  1.  Iron deficiency, without anemia - Iron deficiency noted April 2025.  Hemoglobin has been normal.  She reports lifelong iron-deficiency, even as an infant and young child. - Recent endoscopy (09/12/2023) with polyps in gastric fundus, inflammation in the stomach but no evidence of bleeding - Colonoscopy (02/09/2020): Diverticula, polyp x 1.  Next colonoscopy due in 2026 - Poor tolerance of oral iron with nausea and vomiting - Received IV iron with Feraheme  x 2 in August 2025 due to symptomatic iron deficiency - Most recent labs (01/09/2024): Hgb 14.2, ferritin 141, iron saturation 18% with mildly elevated TIBC 461 - No rectal bleeding or melena - PLAN: No indication for IV iron at this time.  Repeat labs with office visit in 6 months. - Oral iron discontinued due to poor tolerance  2.  Vitamin B12 deficiency - Patient reported a history of probably pernicious anemia, but intrinsic factor antibodies were negative. - Labs from 04/06/2023 showed low  vitamin B12 152, normal MMA - Poor tolerance of oral vitamin B12 due to nausea and vomiting - Currently receiving monthly B12 injections. - Most recent labs (01/09/2024) with normalized vitamin B12 415.  Folate normal at 8.6. - PLAN: Continue monthly B12 injections. - Oral B12 discontinued due to poor tolerance - Check vitamin B12 and MMA in 6 months.  3.  Thrombocytosis - Mildly elevated platelets >400 since January 2024 - Platelets returned to normal following IV iron repletion - PLAN: Suspect reactive thrombocytosis from iron deficiency.  Resolved after IV iron repletion.  PLAN SUMMARY: >> Monthly B12 injections >> Labs in 6 months = CBC/D, ferritin, iron/TIBC, B12, MMA >> OFFICE visit in 6 months (1 week after labs)     REVIEW OF SYSTEMS:   Review of Systems  Constitutional:  Negative for appetite change, chills, diaphoresis, fatigue, fever and unexpected weight change.  HENT:   Negative for lump/mass and nosebleeds.   Eyes:  Negative for eye problems.  Respiratory:  Negative for cough, hemoptysis and shortness of breath.   Cardiovascular:  Negative for chest pain, leg swelling and palpitations.  Gastrointestinal:  Negative for abdominal pain, blood in stool, constipation, diarrhea, nausea and vomiting.  Genitourinary:  Negative for hematuria.   Skin: Negative.   Neurological:  Negative for dizziness, headaches and light-headedness.  Hematological:  Does not bruise/bleed easily.     PHYSICAL EXAM:  ECOG PERFORMANCE STATUS: 0 - Asymptomatic  Vitals:   01/16/24 0835  BP: 136/73  Pulse: 64  Resp: 18  Temp: 98.2 F (36.8 C)  SpO2: 96%  Filed Weights   01/16/24 0835  Weight: 176 lb (79.8 kg)   Physical Exam Constitutional:      Appearance: Normal appearance. She is obese.  Cardiovascular:     Heart sounds: Normal heart sounds.  Pulmonary:     Breath sounds: Normal breath sounds.  Neurological:     General: No focal deficit present.     Mental Status: Mental  status is at baseline.  Psychiatric:        Behavior: Behavior normal. Behavior is cooperative.    PAST MEDICAL/SURGICAL HISTORY:  Past Medical History:  Diagnosis Date   Allergy    Arthritis    Cataract    COPD (chronic obstructive pulmonary disease) (HCC)    Diastolic dysfunction    Diverticulitis    GERD (gastroesophageal reflux disease)    History of stress test    a. 05/2001 Neg ex cardiolite; b. 10/2013 neg ETT.   Hyperlipidemia    Hypertension    Hypertriglyceridemia    Palpitations    a. managed w/ bb and ccb.   Polycystic kidney disease    Sleep apnea    wears cpap    Past Surgical History:  Procedure Laterality Date   CATARACT EXTRACTION     Childbirth     COLONOSCOPY     TOTAL ABDOMINAL HYSTERECTOMY W/ BILATERAL SALPINGOOPHORECTOMY     UPPER GASTROINTESTINAL ENDOSCOPY      SOCIAL HISTORY:  Social History   Socioeconomic History   Marital status: Divorced    Spouse name: Not on file   Number of children: 1   Years of education: 12   Highest education level: 12th grade  Occupational History   Occupation: Full time    Occupation: HR    Employer: [employer]  Tobacco Use   Smoking status: Former    Current packs/day: 0.00    Average packs/day: 1 pack/day for 35.0 years (35.0 ttl pk-yrs)    Types: Cigarettes    Start date: 11/26/1975    Quit date: 11/26/2010    Years since quitting: 13.1    Passive exposure: Past   Smokeless tobacco: Never  Vaping Use   Vaping status: Never Used  Substance and Sexual Activity   Alcohol use: No   Drug use: No   Sexual activity: Not Currently    Partners: Male    Birth control/protection: Surgical    Comment: hysterectomy,1st intercourse 68 YEARS OLD, Fewer than 5 PARTNERS,des neg  Other Topics Concern   Not on file  Social History Narrative   Not on file   Social Drivers of Health   Financial Resource Strain: Low Risk  (07/27/2022)   Overall Financial Resource Strain (CARDIA)    Difficulty of Paying Living  Expenses: Not hard at all  Food Insecurity: No Food Insecurity (07/27/2022)   Hunger Vital Sign    Worried About Running Out of Food in the Last Year: Never true    Ran Out of Food in the Last Year: Never true  Transportation Needs: No Transportation Needs (07/27/2022)   PRAPARE - Administrator, Civil Service (Medical): No    Lack of Transportation (Non-Medical): No  Physical Activity: Inactive (07/27/2022)   Exercise Vital Sign    Days of Exercise per Week: 0 days    Minutes of Exercise per Session: 0 min  Stress: No Stress Concern Present (07/27/2022)   [employer] of Occupational Health - Occupational Stress Questionnaire    Feeling of Stress : Not at all  Social Connections:  Moderately Integrated (07/27/2022)   Social Connection and Isolation Panel    Frequency of Communication with Friends and Family: More than three times a week    Frequency of Social Gatherings with Friends and Family: More than three times a week    Attends Religious Services: More than 4 times per year    Active Member of [employer] or Organizations: Yes    Attends Engineer, Structural: More than 4 times per year    Marital Status: Divorced  Intimate Partner Violence: Not At Risk (07/27/2022)   Humiliation, Afraid, Rape, and Kick questionnaire    Fear of Current or Ex-Partner: No    Emotionally Abused: No    Physically Abused: No    Sexually Abused: No    FAMILY HISTORY:  Family History  Problem Relation Age of Onset   Hyperlipidemia Mother    Hypertension Mother    Breast cancer Mother 2   Colon cancer Mother 33   Kidney failure Mother    Polycystic kidney disease Mother    Hyperlipidemia Father        History of coronary artery by   Bladder Cancer Father    Colon polyps Father    Breast cancer Paternal Aunt        After age 20   Breast cancer Paternal Aunt        After age 23   Esophageal cancer Neg Hx    Rectal cancer Neg Hx    Stomach cancer Neg Hx     CURRENT  MEDICATIONS:  Outpatient Encounter Medications as of 01/16/2024  Medication Sig   amitriptyline  (ELAVIL ) 25 MG tablet Take by mouth.   aspirin  EC 81 MG tablet Take 81 mg by mouth at bedtime.   bisoprolol  (ZEBETA ) 5 MG tablet Take 5 mg by mouth 2 (two) times daily.   diltiazem  (CARDIZEM  CD) 360 MG 24 hr capsule Take 1 capsule (360 mg total) by mouth daily.   estradiol  (ESTRACE  VAGINAL) 0.1 MG/GM vaginal cream Apply to the outside 1 gram vaginally twice weekly   fenofibrate  160 MG tablet Take 160 mg by mouth every morning.   hydrALAZINE (APRESOLINE) 25 MG tablet Take 25 mg by mouth 2 (two) times daily. Takes prn   lansoprazole (PREVACID) 15 MG capsule Take 15 mg by mouth at bedtime.   losartan (COZAAR) 50 MG tablet Take 50 mg by mouth daily.   LUMIGAN 0.01 % SOLN Place 1 drop into both eyes at bedtime.   melatonin 5 MG TABS 1 tablet in the evening Orally Once a day   metFORMIN (GLUCOPHAGE-XR) 500 MG 24 hr tablet Take 500 mg by mouth daily with breakfast.   montelukast  (SINGULAIR ) 10 MG tablet Take 10 mg by mouth every morning.    OVER THE COUNTER MEDICATION B12 injections every 2 months   pantoprazole  (PROTONIX ) 40 MG tablet Take 40 mg by mouth daily.   polyethylene glycol powder (MIRALAX) 17 GM/SCOOP powder 1 scoop mixed with 8 ounces of fluid Orally Once a day   rosuvastatin (CRESTOR) 20 MG tablet Take 20 mg by mouth at bedtime.   sucralfate (CARAFATE) 1 g tablet Take 1 g by mouth 2 (two) times daily. Makes into a slurry   Facility-Administered Encounter Medications as of 01/16/2024  Medication   0.9 %  sodium chloride  infusion    ALLERGIES:  Allergies  Allergen Reactions   Albuterol Other (See Comments)    palpitations   Icosapent Ethyl (Epa Ethyl Ester) (Fish) Itching    Other reaction(s):  Tachycardia   Latex Other (See Comments)    whelps   Codeine Nausea And Vomiting   Morphine Nausea And Vomiting   Penicillins Swelling and Rash    Childhood reaction   Predicort  [Prednisolone] Nausea And Vomiting and Other (See Comments)    Cannot take orally, states that she can take via injection     LABORATORY DATA:  I have reviewed the labs as listed.  CBC    Component Value Date/Time   WBC 5.8 01/09/2024 1037   RBC 4.55 01/09/2024 1037   HGB 14.2 01/09/2024 1037   HCT 42.7 01/09/2024 1037   PLT 371 01/09/2024 1037   MCV 93.8 01/09/2024 1037   MCH 31.2 01/09/2024 1037   MCHC 33.3 01/09/2024 1037   RDW 13.5 01/09/2024 1037   LYMPHSABS 1.6 01/09/2024 1037   MONOABS 0.5 01/09/2024 1037   EOSABS 0.2 01/09/2024 1037   BASOSABS 0.0 01/09/2024 1037      Latest Ref Rng & Units 01/09/2024   10:37 AM 10/03/2023    2:15 PM 06/28/2023    3:33 PM  CMP  Glucose 70 - 99 mg/dL 831  893  861   BUN 8 - 23 mg/dL 18  14  19    Creatinine 0.44 - 1.00 mg/dL 9.21  9.23  9.15   Sodium 135 - 145 mmol/L 134  138  133   Potassium 3.5 - 5.1 mmol/L 4.3  4.7  3.5   Chloride 98 - 111 mmol/L 96  101  99   CO2 22 - 32 mmol/L 27  25  24    Calcium  8.9 - 10.3 mg/dL 89.6  9.8  9.3   Total Protein 6.5 - 8.1 g/dL 7.1  7.3  6.9   Total Bilirubin 0.0 - 1.2 mg/dL 0.2  0.5  0.4   Alkaline Phos 38 - 126 U/L 47  41  37   AST 15 - 41 U/L 25  23  25    ALT 0 - 44 U/L 19  21  23      DIAGNOSTIC IMAGING:  I have independently reviewed the relevant imaging and discussed with the patient.   WRAP UP:  All questions were answered. The patient knows to call the clinic with any problems, questions or concerns.  Medical decision making: Moderate  Time spent on visit: I spent 20 minutes counseling the patient face to face. The total time spent in the appointment was 30 minutes and more than 50% was on counseling.  Pleasant Shelley Barefoot, PA-C  01/16/24 8:59 AM

## 2024-01-16 ENCOUNTER — Inpatient Hospital Stay: Attending: Hematology | Admitting: Physician Assistant

## 2024-01-16 VITALS — BP 136/73 | HR 64 | Temp 98.2°F | Resp 18 | Wt 176.0 lb

## 2024-01-16 DIAGNOSIS — D509 Iron deficiency anemia, unspecified: Secondary | ICD-10-CM | POA: Diagnosis present

## 2024-01-16 DIAGNOSIS — Z885 Allergy status to narcotic agent status: Secondary | ICD-10-CM | POA: Diagnosis not present

## 2024-01-16 DIAGNOSIS — Z723 Lack of physical exercise: Secondary | ICD-10-CM | POA: Insufficient documentation

## 2024-01-16 DIAGNOSIS — Z83719 Family history of colon polyps, unspecified: Secondary | ICD-10-CM | POA: Diagnosis not present

## 2024-01-16 DIAGNOSIS — Z83438 Family history of other disorder of lipoprotein metabolism and other lipidemia: Secondary | ICD-10-CM | POA: Diagnosis not present

## 2024-01-16 DIAGNOSIS — I10 Essential (primary) hypertension: Secondary | ICD-10-CM | POA: Diagnosis not present

## 2024-01-16 DIAGNOSIS — Z803 Family history of malignant neoplasm of breast: Secondary | ICD-10-CM | POA: Diagnosis not present

## 2024-01-16 DIAGNOSIS — Z9849 Cataract extraction status, unspecified eye: Secondary | ICD-10-CM | POA: Insufficient documentation

## 2024-01-16 DIAGNOSIS — Z91040 Latex allergy status: Secondary | ICD-10-CM | POA: Diagnosis not present

## 2024-01-16 DIAGNOSIS — Z79899 Other long term (current) drug therapy: Secondary | ICD-10-CM | POA: Diagnosis not present

## 2024-01-16 DIAGNOSIS — Z8249 Family history of ischemic heart disease and other diseases of the circulatory system: Secondary | ICD-10-CM | POA: Diagnosis not present

## 2024-01-16 DIAGNOSIS — D75839 Thrombocytosis, unspecified: Secondary | ICD-10-CM | POA: Insufficient documentation

## 2024-01-16 DIAGNOSIS — Z880 Allergy status to penicillin: Secondary | ICD-10-CM | POA: Insufficient documentation

## 2024-01-16 DIAGNOSIS — Z87891 Personal history of nicotine dependence: Secondary | ICD-10-CM | POA: Insufficient documentation

## 2024-01-16 DIAGNOSIS — Z800 Family history of malignant neoplasm of digestive organs: Secondary | ICD-10-CM | POA: Diagnosis not present

## 2024-01-16 DIAGNOSIS — Z8052 Family history of malignant neoplasm of bladder: Secondary | ICD-10-CM | POA: Diagnosis not present

## 2024-01-16 DIAGNOSIS — Z8271 Family history of polycystic kidney: Secondary | ICD-10-CM | POA: Diagnosis not present

## 2024-01-16 DIAGNOSIS — G4730 Sleep apnea, unspecified: Secondary | ICD-10-CM | POA: Insufficient documentation

## 2024-01-16 DIAGNOSIS — E538 Deficiency of other specified B group vitamins: Secondary | ICD-10-CM | POA: Diagnosis not present

## 2024-01-16 DIAGNOSIS — Z90710 Acquired absence of both cervix and uterus: Secondary | ICD-10-CM | POA: Insufficient documentation

## 2024-01-16 DIAGNOSIS — Z7982 Long term (current) use of aspirin: Secondary | ICD-10-CM | POA: Diagnosis not present

## 2024-01-16 DIAGNOSIS — Z90722 Acquired absence of ovaries, bilateral: Secondary | ICD-10-CM | POA: Diagnosis not present

## 2024-01-16 DIAGNOSIS — Z91013 Allergy to seafood: Secondary | ICD-10-CM | POA: Insufficient documentation

## 2024-01-16 DIAGNOSIS — E611 Iron deficiency: Secondary | ICD-10-CM

## 2024-01-16 DIAGNOSIS — Z860100 Personal history of colon polyps, unspecified: Secondary | ICD-10-CM | POA: Insufficient documentation

## 2024-01-16 NOTE — Patient Instructions (Signed)
 [facility] at [facility] **VISIT SUMMARY & IMPORTANT INSTRUCTIONS **   You were seen today by Pleasant Barefoot PA-C for your iron deficiency and vitamin B12 deficiency.   Your blood and iron levels look great! You do not need any IV iron at this time. Your vitamin B12 levels look much better.  We will continue vitamin B12 injections once a month.  FOLLOW-UP APPOINTMENT: 6 months  ** Thank you for trusting me with your healthcare!  I strive to provide all of my patients with quality care at each visit.  If you receive a survey for this visit, I would be so grateful to you for taking the time to provide feedback.  Thank you in advance!  ~ Harjot Dibello                                        Dr. Mickiel Davonna Pleasant Barefoot, PA-C       Delon Hope, NP   - - - - - - - - - - - - - - - - - -    Thank you for choosing [facility] at [facility] to provide your oncology and hematology care.  To afford each patient quality time with our provider, please arrive at least 15 minutes before your scheduled appointment time.   If you have a lab appointment with the Cancer Center please come in thru the Main Entrance and check in at the main information desk.  You need to re-schedule your appointment should you arrive 10 or more minutes late.  We strive to give you quality time with our providers, and arriving late affects you and other patients whose appointments are after yours.  Also, if you no show three or more times for appointments you may be dismissed from the clinic at the providers discretion.     Again, thank you for choosing [facility].  Our hope is that these requests will decrease the amount of time that you wait before being seen by our physicians.       _____________________________________________________________  Should you have questions after your visit to [facility], please contact our office at [phone] and follow the prompts.  Our office hours are 8:00 a.m. and 4:30 p.m. Monday - Friday.  Please note that voicemails left after 4:00 p.m. may not be returned until the following business day.  We are closed weekends and major holidays.  You do have access to a nurse 24-7, just call the main number to the clinic [phone] and do not press any options, hold on the line and a nurse will answer the phone.    For prescription refill requests, have your pharmacy contact our office and allow 72 hours.

## 2024-01-23 ENCOUNTER — Encounter: Payer: Self-pay | Admitting: Oncology

## 2024-02-04 ENCOUNTER — Telehealth: Payer: Self-pay | Admitting: Cardiology

## 2024-02-04 NOTE — Telephone Encounter (Signed)
 Patient c/o Palpitations: STAT if patient c/o lightheadedness, shortness of breath, or chest pain  How long have you had palpitations/irregular HR/ Afib? Are you having the symptoms now?  Irregular HR--has been feeling extra heart beats the past few weeks. No symptoms currently.  Are you currently experiencing lightheadedness, SOB or CP?  No   Do you have a history of afib (atrial fibrillation) or irregular heart rhythm?  Hx irregular HR/murmur   Have you checked your BP or HR? (document readings if available):  HR is fine. BP is about what it normally is.   Are you experiencing any other symptoms?  No

## 2024-02-04 NOTE — Telephone Encounter (Signed)
 Pt notified of Dr. Alissa response. She will come in on tomorrow for EKG and monitor.

## 2024-02-04 NOTE — Telephone Encounter (Signed)
 Spoke with pt who states that she has had an increase in palpitations over the last 2 wks. She has increased her Bisoprolol  to 5 mg TID. She reports that this has helped some but not completely. Pt denies chest pain, dizziness and syncope. She does report nausea when she has the palpitations. Pt states that she drinks caffeine free drinks and coffee. Pt does not have a current BP or Hr. She states that she was taking hydralazine but stopped d/t feeling that it was the cause of her palpitations. Please advise.

## 2024-02-05 ENCOUNTER — Encounter: Payer: Self-pay | Admitting: Oncology

## 2024-02-05 ENCOUNTER — Ambulatory Visit

## 2024-02-05 ENCOUNTER — Ambulatory Visit: Payer: Self-pay | Attending: Cardiology | Admitting: *Deleted

## 2024-02-05 VITALS — BP 140/80 | HR 82

## 2024-02-05 DIAGNOSIS — R002 Palpitations: Secondary | ICD-10-CM | POA: Diagnosis present

## 2024-02-05 NOTE — Progress Notes (Signed)
 Pt in office for EKG and to have Live Zio placed d/t daily palpitations.

## 2024-02-11 ENCOUNTER — Ambulatory Visit: Payer: Self-pay | Admitting: Gastroenterology

## 2024-02-11 ENCOUNTER — Inpatient Hospital Stay: Payer: Self-pay | Attending: Hematology

## 2024-02-11 ENCOUNTER — Encounter: Payer: Self-pay | Admitting: Gastroenterology

## 2024-02-11 VITALS — BP 161/80 | HR 74 | Temp 97.7°F | Resp 18

## 2024-02-11 VITALS — BP 140/80 | HR 95 | Ht 65.0 in | Wt 177.2 lb

## 2024-02-11 DIAGNOSIS — E538 Deficiency of other specified B group vitamins: Secondary | ICD-10-CM | POA: Diagnosis not present

## 2024-02-11 DIAGNOSIS — K5904 Chronic idiopathic constipation: Secondary | ICD-10-CM

## 2024-02-11 DIAGNOSIS — Z860100 Personal history of colon polyps, unspecified: Secondary | ICD-10-CM

## 2024-02-11 DIAGNOSIS — D509 Iron deficiency anemia, unspecified: Secondary | ICD-10-CM | POA: Diagnosis present

## 2024-02-11 DIAGNOSIS — K219 Gastro-esophageal reflux disease without esophagitis: Secondary | ICD-10-CM | POA: Diagnosis not present

## 2024-02-11 DIAGNOSIS — D508 Other iron deficiency anemias: Secondary | ICD-10-CM

## 2024-02-11 DIAGNOSIS — Z79899 Other long term (current) drug therapy: Secondary | ICD-10-CM | POA: Insufficient documentation

## 2024-02-11 MED ORDER — PANTOPRAZOLE SODIUM 40 MG PO TBEC: 40.0000 mg | DELAYED_RELEASE_TABLET | Freq: Two times a day (BID) | ORAL | 2 refills | Status: AC

## 2024-02-11 MED ORDER — CYANOCOBALAMIN 1000 MCG/ML IJ SOLN
1000.0000 ug | Freq: Once | INTRAMUSCULAR | Status: AC
  Administered 2024-02-11: 1000 ug via INTRAMUSCULAR
  Filled 2024-02-11: qty 1

## 2024-02-11 NOTE — Patient Instructions (Signed)
 [facility] - A DEPT OF [facility]  Discharge Instructions: Thank you for choosing [facility] to provide your oncology and hematology care.  If you have a lab appointment with the Cancer Center - please note that after April 8th, 2024, all labs will be drawn in the cancer center.  You do not have to check in or register with the main entrance as you have in the past but will complete your check-in in the cancer center.  Wear comfortable clothing and clothing appropriate for easy access to any Portacath or PICC line.   We strive to give you quality time with your provider. You may need to reschedule your appointment if you arrive late (15 or more minutes).  Arriving late affects you and other patients whose appointments are after yours.  Also, if you miss three or more appointments without notifying the office, you may be dismissed from the clinic at the provider's discretion.      For prescription refill requests, have your pharmacy contact our office and allow 72 hours for refills to be completed.    Today you received the following: Vitamin B12   Vitamin B12 Injection What is this medication? Vitamin B12 (VAHY tuh min B12) prevents and treats low vitamin B12 levels in your body. It is used in people who do not get enough vitamin B12 from their diet or when their digestive tract does not absorb enough. Vitamin B12 plays an important role in maintaining the health of your nervous system and red blood cells. This medicine may be used for other purposes; ask your health care provider or pharmacist if you have questions. COMMON BRAND NAME(S): B-12 Compliance Kit, B-12 Injection Kit, Cyomin, Dodex, LA-12, Nutri-Twelve, Physicians EZ Use B-12, Primabalt, Vitamin Deficiency Injectable System - B12 What should I tell my care team before I take this medication? They need to know if you have any of these conditions: Kidney disease Leber's  disease Megaloblastic anemia An unusual or allergic reaction to cyanocobalamin, cobalt, other medications, foods, dyes, or preservatives Pregnant or trying to get pregnant Breast-feeding How should I use this medication? This medication is injected into a muscle or deeply under the skin. It is usually given in a clinic or care team's office. However, your care team may teach you how to inject yourself. Follow all instructions. Talk to your care team about the use of this medication in children. Special care may be needed. Overdosage: If you think you have taken too much of this medicine contact a poison control center or emergency room at once. NOTE: This medicine is only for you. Do not share this medicine with others. What if I miss a dose? If you are given your dose at a clinic or care team's office, call to reschedule your appointment. If you give your own injections, and you miss a dose, take it as soon as you can. If it is almost time for your next dose, take only that dose. Do not take double or extra doses. What may interact with this medication? Alcohol Colchicine This list may not describe all possible interactions. Give your health care provider a list of all the medicines, herbs, non-prescription drugs, or dietary supplements you use. Also tell them if you smoke, drink alcohol, or use illegal drugs. Some items may interact with your medicine. What should I watch for while using this medication? Visit your care team regularly. You may need blood work done while you are  taking this medication. You may need to follow a special diet. Talk to your care team. Limit your alcohol intake and avoid smoking to get the best benefit. What side effects may I notice from receiving this medication? Side effects that you should report to your care team as soon as possible: Allergic reactions--skin rash, itching, hives, swelling of the face, lips, tongue, or throat Swelling of the ankles, hands, or  feet Trouble breathing Side effects that usually do not require medical attention (report to your care team if they continue or are bothersome): Diarrhea This list may not describe all possible side effects. Call your doctor for medical advice about side effects. You may report side effects to FDA at 1-800-FDA-1088. Where should I keep my medication? Keep out of the reach of children. Store at room temperature between 15 and 30 degrees C (59 and 85 degrees F). Protect from light. Throw away any unused medication after the expiration date. NOTE: This sheet is a summary. It may not cover all possible information. If you have questions about this medicine, talk to your doctor, pharmacist, or health care provider.  2024 Elsevier/Gold Standard (2020-11-09 00:00:00)    To help prevent nausea and vomiting after your treatment, we encourage you to take your nausea medication as directed.  BELOW ARE SYMPTOMS THAT SHOULD BE REPORTED IMMEDIATELY: *FEVER GREATER THAN 100.4 F (38 C) OR HIGHER *CHILLS OR SWEATING *NAUSEA AND VOMITING THAT IS NOT CONTROLLED WITH YOUR NAUSEA MEDICATION *UNUSUAL SHORTNESS OF BREATH *UNUSUAL BRUISING OR BLEEDING *URINARY PROBLEMS (pain or burning when urinating, or frequent urination) *BOWEL PROBLEMS (unusual diarrhea, constipation, pain near the anus) TENDERNESS IN MOUTH AND THROAT WITH OR WITHOUT PRESENCE OF ULCERS (sore throat, sores in mouth, or a toothache) UNUSUAL RASH, SWELLING OR PAIN  UNUSUAL VAGINAL DISCHARGE OR ITCHING   Items with * indicate a potential emergency and should be followed up as soon as possible or go to the Emergency Department if any problems should occur.  Please show the CHEMOTHERAPY ALERT CARD or IMMUNOTHERAPY ALERT CARD at check-in to the Emergency Department and triage nurse.  Should you have questions after your visit or need to cancel or reschedule your appointment, please contact [facility] [place] - A DEPT OF [facility] [phone]  and follow the prompts.  Office hours are 8:00 a.m. to 4:30 p.m. Monday - Friday. Please note that voicemails left after 4:00 p.m. may not be returned until the following business day.  We are closed weekends and major holidays. You have access to a nurse at all times for urgent questions. Please call the main number to the clinic [phone] and follow the prompts.  For any non-urgent questions, you may also contact your provider using MyChart. We now offer e-Visits for anyone 58 and older to request care online for non-urgent symptoms. For details visit [email or web address].   Also download the MyChart app! Go to the app store, search "MyChart", open the app, select [place], and log in with your MyChart username and password.

## 2024-02-11 NOTE — Progress Notes (Signed)
 Patient tolerated Vitamin B 12 injection with no complaints voiced.  Site clean and dry with no bruising or swelling noted.  No complaints of pain.  Discharged with vital signs stable and no signs or symptoms of distress noted.   ?

## 2024-02-11 NOTE — Progress Notes (Signed)
 Chief Complaint: follow-up GERD Primary GI Doctor: (previously Dr. Eda) Dr. [name]  HPI:  Patient is a  68 year old female patient, previously known to Dr. Eda, with past medical history of IDA, B12 deficiency, GERD, and hypertension,who presents today for uncontrolled GERD.    01/16/2024 patient seen by oncology/ Hematology for iron deficiency and pernicious anemia. Patient is receiving IV iron prn. Most recent labs (01/09/2024): Hgb 14.2, ferritin 141, iron saturation 18% with mildly elevated TIBC 461. No indication for IV iron at this time. Most recent labs (01/09/2024) with normalized vitamin B12 415. Folate normal at 8.6. Intrinsic factor: Negative, methylmalonic acid: Normal. Pt on b 12 injection once month.  Interval History Patient was last seen in GI office on 08/08/23 by myself.  Patient presents for follow-up. She is currently taking Pantoprazole  40 mg po daily. She continues with intermittent pyrosis and regurgitation. She will eat small amount and get bloated and feels full. She notes no particular food, can happen after eating one cracker. She stopped using the sucralfate because it was not helpful.     Patient also takes Miralax daily at bedtime for chronic constipation which regulates her bowels.   Patient on baby Aspirin  81 mg po daily.   Currently has a zio heart monitor on, reports she has had some intermittent heart palpitations they are evaluating.   Wt Readings from Last 3 Encounters:  02/11/24 177 lb 4 oz (80.4 kg)  01/16/24 176 lb (79.8 kg)  12/06/23 173 lb (78.5 kg)    Past Medical History:  Diagnosis Date   Allergy    Arthritis    Cataract    COPD (chronic obstructive pulmonary disease) (HCC)    Diastolic dysfunction    Diverticulitis    GERD (gastroesophageal reflux disease)    History of stress test    a. 05/2001 Neg ex cardiolite; b. 10/2013 neg ETT.   Hyperlipidemia    Hypertension    Hypertriglyceridemia    Palpitations    a. managed w/  bb and ccb.   Polycystic kidney disease    Sleep apnea    wears cpap     Past Surgical History:  Procedure Laterality Date   CATARACT EXTRACTION     Childbirth     COLONOSCOPY     TOTAL ABDOMINAL HYSTERECTOMY W/ BILATERAL SALPINGOOPHORECTOMY     UPPER GASTROINTESTINAL ENDOSCOPY      Current Outpatient Medications  Medication Sig Dispense Refill   amitriptyline  (ELAVIL ) 25 MG tablet Take by mouth.     aspirin  EC 81 MG tablet Take 81 mg by mouth at bedtime.     bisoprolol  (ZEBETA ) 5 MG tablet Take 5 mg by mouth 2 (two) times daily.     diltiazem  (CARDIZEM  CD) 360 MG 24 hr capsule Take 1 capsule (360 mg total) by mouth daily. 90 capsule 3   estradiol  (ESTRACE  VAGINAL) 0.1 MG/GM vaginal cream Apply to the outside 1 gram vaginally twice weekly 42.5 g 1   fenofibrate  160 MG tablet Take 160 mg by mouth every morning.     losartan (COZAAR) 50 MG tablet Take 50 mg by mouth daily.     LUMIGAN 0.01 % SOLN Place 1 drop into both eyes at bedtime.     melatonin 5 MG TABS 1 tablet in the evening Orally Once a day     metFORMIN (GLUCOPHAGE-XR) 500 MG 24 hr tablet Take 500 mg by mouth daily with breakfast.     montelukast  (SINGULAIR ) 10 MG tablet Take 10 mg  by mouth every morning.      OVER THE COUNTER MEDICATION B12 injections every 2 months     polyethylene glycol powder (MIRALAX) 17 GM/SCOOP powder 1 scoop mixed with 8 ounces of fluid Orally Once a day     rosuvastatin (CRESTOR) 20 MG tablet Take 20 mg by mouth at bedtime.     sucralfate (CARAFATE) 1 g tablet Take 1 g by mouth 2 (two) times daily. Makes into a slurry     hydrALAZINE (APRESOLINE) 25 MG tablet Take 25 mg by mouth 2 (two) times daily. Takes prn (Patient not taking: Reported on 02/11/2024)     pantoprazole  (PROTONIX ) 40 MG tablet Take 1 tablet (40 mg total) by mouth 2 (two) times daily. 60 tablet 2   Current Facility-Administered Medications  Medication Dose Route Frequency Provider Last Rate Last Admin   0.9 %  sodium chloride   infusion  500 mL Intravenous Once Shelley [name] Iha, MD        Allergies as of 02/11/2024 - Review Complete 02/11/2024  Allergen Reaction Noted   Albuterol Other (See Comments) 01/02/2021   Icosapent ethyl (epa ethyl ester) (fish) Itching 11/23/2021   Latex Other (See Comments) 02/09/2020   Codeine Nausea And Vomiting    Morphine Nausea And Vomiting    Penicillins Swelling and Rash    Predicort [prednisolone] Nausea And Vomiting and Other (See Comments) 01/18/2012    Family History  Problem Relation Age of Onset   Hyperlipidemia Mother    Hypertension Mother    Breast cancer Mother 92   Colon cancer Mother 67   Kidney failure Mother    Polycystic kidney disease Mother    Hyperlipidemia Father        History of coronary artery by   Bladder Cancer Father    Colon polyps Father    Breast cancer Paternal Aunt        After age 38   Breast cancer Paternal Aunt        After age 3   Esophageal cancer Neg Hx    Rectal cancer Neg Hx    Stomach cancer Neg Hx     Review of Systems:    Constitutional: No weight loss, fever, chills, weakness or fatigue HEENT: Eyes: No change in vision               Ears, Nose, Throat:  No change in hearing or congestion Skin: No rash or itching Cardiovascular: No chest pain, chest pressure or palpitations   Respiratory: No SOB or cough Gastrointestinal: See HPI and otherwise negative Genitourinary: No dysuria or change in urinary frequency Neurological: No headache, dizziness or syncope Musculoskeletal: No new muscle or joint pain Hematologic: No bleeding or bruising Psychiatric: No history of depression or anxiety    Physical Exam:  Vital signs: BP (!) 140/80   Pulse 95   Ht 5' 5 (1.651 m)   Wt 177 lb 4 oz (80.4 kg)   BMI 29.50 kg/m   Constitutional:  Pleasant female appears to be in NAD, Well developed, Well nourished, alert and cooperative Throat: Oral cavity and pharynx without inflammation, swelling or lesion.  Respiratory:  Respirations even and unlabored. Lungs clear to auscultation bilaterally.   No wheezes, crackles, or rhonchi.  Cardiovascular: Normal S1, S2. Regular rate and rhythm. No peripheral edema, cyanosis or pallor.  Gastrointestinal:  Soft, nondistended, nontender. No rebound or guarding. Normal bowel sounds. No appreciable masses or hepatomegaly. Rectal:  Not performed.  Msk:  Symmetrical without gross deformities. Without edema,  no deformity or joint abnormality.  Neurologic:  Alert and  oriented x4;  grossly normal neurologically.  Skin:   Dry and intact without significant lesions or rashes. Psychiatric: Oriented to person, place and time. Demonstrates good judgement and reason without abnormal affect or behaviors.  RELEVANT LABS AND IMAGING: CBC    Latest Ref Rng & Units 01/09/2024   10:37 AM 10/03/2023    2:15 PM 06/28/2023    3:33 PM  CBC  WBC 4.0 - 10.5 K/uL 5.8  5.5  5.2   Hemoglobin 12.0 - 15.0 g/dL 85.7  86.0  86.7   Hematocrit 36.0 - 46.0 % 42.7  43.0  39.3   Platelets 150 - 400 K/uL 371  409  358      CMP     Latest Ref Rng & Units 01/09/2024   10:37 AM 10/03/2023    2:15 PM 06/28/2023    3:33 PM  CMP  Glucose 70 - 99 mg/dL 831  893  861   BUN 8 - 23 mg/dL 18  14  19    Creatinine 0.44 - 1.00 mg/dL 9.21  9.23  9.15   Sodium 135 - 145 mmol/L 134  138  133   Potassium 3.5 - 5.1 mmol/L 4.3  4.7  3.5   Chloride 98 - 111 mmol/L 96  101  99   CO2 22 - 32 mmol/L 27  25  24    Calcium  8.9 - 10.3 mg/dL 89.6  9.8  9.3   Total Protein 6.5 - 8.1 g/dL 7.1  7.3  6.9   Total Bilirubin 0.0 - 1.2 mg/dL 0.2  0.5  0.4   Alkaline Phos 38 - 126 U/L 47  41  37   AST 15 - 41 U/L 25  23  25    ALT 0 - 44 U/L 19  21  23    GI procedures:  09/2023 EGD - Normal esophagus. - A few gastric polyps. - Gastritis. Biopsied. - Normal gastric body, cardia and gastric fundus. Biopsied. - Normal duodenal bulb and second portion of the duodenum. Biopsied. Path: 1. Surgical [P], duodenal bulb, 2nd portion of  duodenum, and distal duodenum :      - DUODENAL MUCOSA WITH NO SPECIFIC HISTOPATHOLOGIC CHANGES      - NEGATIVE FOR INCREASED INTRAEPITHELIAL LYMPHOCYTES OR VILLOUS ARCHITECTURAL      CHANGES       2. Surgical [P], gastric antrum :      - GASTRIC ANTRAL MUCOSA WITH NONSPECIFIC REACTIVE GASTROPATHY      - HELICOBACTER PYLORI-LIKE ORGANISMS ARE NOT IDENTIFIED ON ROUTINE H&E STAIN       3. Surgical [P], gastric incisura :      - GASTRIC ANTRAL MUCOSA WITH NONSPECIFIC REACTIVE GASTROPATHY      - HELICOBACTER PYLORI-LIKE ORGANISMS ARE NOT IDENTIFIED ON ROUTINE H&E STAIN       4. Surgical [P], gastric body :      - GASTRIC OXYNTIC MUCOSA WITH PARIETAL CELL HYPERPLASIA AS CAN BE SEEN IN      HYPERGASTRINEMIC STATES SUCH AS PPI THERAPY.      - HELICOBACTER PYLORI-LIKE ORGANISMS ARE NOT IDENTIFIED ON ROUTINE H&E STAIN   02/09/20 colonoscopy with Dr. Eda, recall 5 years  Impression:  - Diverticulosis in the sigmoid colon.  - Internal hemorrhoids. - One 1 mm polyp in the descending colon, removed with a cold biopsy forceps. Resected and retrieved. - The examination was otherwise normal on direct and retroflexion views. Path: Diagnosis Surgical [P], colon,  descending, polyp (1) - TUBULAR ADENOMA. - NO HIGH GRADE DYSPLASIA OR CARCINOMA  11/09/2013 colonoscopy with Dr. Avram ENDOSCOPIC IMPRESSION:  1. There was severe diverticulosis noted in the sigmoid colon  2. The examination was otherwise normal - excellent prep 11/10/2014 EGD with Dr. Avram ENDOSCOPIC IMPRESSION:  1) small hiatal hernia w/ slightly irregular Z- line  2) No Barrett' s present  3) Otherwise normal 07/31/2005 colonoscopy with Dr. Alm Gander Normal exam- cecum to rectum. No seen: polyps, AVMs, colitis, tumors, melanosis, crohn's, diverticulosis, or hemorrhoids  04/04/19 echo-LVEF at 60 to 65%.      Assessment: Encounter Diagnoses  Name Primary?   Gastroesophageal reflux disease without esophagitis Yes    Vitamin B 12 deficiency    Chronic idiopathic constipation    History of colonic polyps    Other iron deficiency anemia   68 year old female patient with history of GERD, EGD 09/2023 showed gastritis with negative biopsy.  Symptoms not managed with pantoprazole  in the morning and Pepcid in the evening.  Will go ahead and optimize her pantoprazole  and increase it to twice daily as a trial to see if this helps with her current symptoms.  Patient has had gastric emptying study in the past that was negative. Patient also has chronic constipation that is managed with daily MiraLAX, no changes needed.  Patient up-to-date on colon screening colonoscopy with history of tubular adenomas with recall set for November 2026. Lastly patient continues to see hematology for history of iron deficiency anemia and pernicious anemia.  Patient receiving IV iron as needed along with B12 injections monthly.  No overt bleeding.Neg fecal occult with PCP.  Intrinsic factor: Negative, methylmalonic acid: Normal.    Plan: -Continue Miralax po daily -Will dose optimize her pantoprazole  from 40 mg once daily to 40 mg p.o. twice daily. - If that is not effective, some patients benefit from the addition of a medication called baclofen that can tighten the lower esophageal sphincter muscle to limit symptoms of acid reflux.  -Continue Pepcid prn -GERD diet, no late meals 3-4 hours before lying down. -follow-up with hematology as scheduled -recall colonoscopy 01/2025  -follow-up with Dr. [name] or APP in   Thank you for the courtesy of this consult. Please call me with any questions or concerns.   Shelley Mccomber, FNP-C [facility] 02/11/2024, 4:33 PM  I have reviewed the clinic note as outlined by Shelley Beal, NP and agree with the assessment, plan and medical decision making.  Shelley [name] returns to the gastroenterology office today for follow-up of GERD.  EGD 09/2023 disclosed H. pylori negative gastritis but  otherwise normal.  Currently having ongoing symptoms despite pantoprazole  40 mg orally once daily.  Agree with increasing to twice daily dosing and adding Pepcid as needed.  Also agree with consideration of adding baclofen if symptoms cannot be controlled with antacid therapy alone.  Shelley [name], MD

## 2024-02-11 NOTE — Patient Instructions (Addendum)
 Constipation -Continue Miralax po daily -recommend high fiber diet  GERD -Increase  pantoprazole  from 40 mg once daily to 40 mg p.o. twice daily. -Continue Pepcid prn -Recommend GERD diet   _______________________________________________________  If your blood pressure at your visit was 140/90 or greater, please contact your primary care physician to follow up on this.  _______________________________________________________  If you are age 66 or older, your body mass index should be between 23-30. Your Body mass index is 29.5 kg/m. If this is out of the aforementioned range listed, please consider follow up with your Primary Care Provider.  If you are age 56 or younger, your body mass index should be between 19-25. Your Body mass index is 29.5 kg/m. If this is out of the aformentioned range listed, please consider follow up with your Primary Care Provider.   ________________________________________________________  The [facility] providers would like to encourage you to use MYCHART to communicate with providers for non-urgent requests or questions.  Due to long hold times on the telephone, sending your provider a message by [facility] may be a faster and more efficient way to get a response.  Please allow 48 business hours for a response.  Please remember that this is for non-urgent requests.  _______________________________________________________  [facility] is using a team-based approach to care.  Your team is made up of your doctor and two to three APPS. Our APPS (Nurse Practitioners and Physician Assistants) work with your physician to ensure care continuity for you. They are fully qualified to address your health concerns and develop a treatment plan. They communicate directly with your gastroenterologist to care for you. Seeing the Advanced Practice Practitioners on your physician's team can help you by facilitating care more promptly, often allowing for earlier  appointments, access to diagnostic testing, procedures, and other specialty referrals.   Thank you for trusting me with your gastrointestinal care. Deanna May, FNP-C

## 2024-02-14 ENCOUNTER — Other Ambulatory Visit: Payer: Self-pay | Admitting: Nephrology

## 2024-02-14 DIAGNOSIS — N281 Cyst of kidney, acquired: Secondary | ICD-10-CM

## 2024-02-25 ENCOUNTER — Inpatient Hospital Stay: Admission: RE | Admit: 2024-02-25 | Discharge: 2024-02-25 | Attending: Nephrology

## 2024-02-25 DIAGNOSIS — N281 Cyst of kidney, acquired: Secondary | ICD-10-CM

## 2024-02-28 ENCOUNTER — Ambulatory Visit: Payer: Self-pay | Admitting: Cardiology

## 2024-02-28 DIAGNOSIS — R002 Palpitations: Secondary | ICD-10-CM | POA: Diagnosis not present

## 2024-03-10 ENCOUNTER — Encounter: Payer: Self-pay | Admitting: *Deleted

## 2024-03-10 ENCOUNTER — Encounter: Payer: Self-pay | Admitting: Oncology

## 2024-03-11 ENCOUNTER — Encounter: Payer: Self-pay | Admitting: Nurse Practitioner

## 2024-03-11 ENCOUNTER — Ambulatory Visit: Payer: Self-pay | Admitting: Cardiology

## 2024-03-11 ENCOUNTER — Ambulatory Visit: Attending: Nurse Practitioner | Admitting: Nurse Practitioner

## 2024-03-11 VITALS — BP 132/84 | HR 69 | Ht 65.0 in | Wt 178.0 lb

## 2024-03-11 DIAGNOSIS — I10 Essential (primary) hypertension: Secondary | ICD-10-CM | POA: Insufficient documentation

## 2024-03-11 DIAGNOSIS — R002 Palpitations: Secondary | ICD-10-CM | POA: Diagnosis present

## 2024-03-11 DIAGNOSIS — R011 Cardiac murmur, unspecified: Secondary | ICD-10-CM | POA: Insufficient documentation

## 2024-03-11 NOTE — Progress Notes (Signed)
 " Cardiology Office Note   Date: 03/11/2024 ID:  Shelley [name], Shelley [name] 05/10/1955, MRN [id number] PCP: Patient, No Pcp Per  [facility] Providers Cardiologist:  Jayson Sierras, MD     History of Present Illness Shelley [name] is a 68 y.o. female with a PMH of palpitations, HTN, hyponatremia, polycystic kidney disease, who presents today for follow-up.   Last seen by Dr. Sierras on March 29, 2022. Was overall doing well at the time.   Today she is here for scheduled follow-up. Does admit to stress with caregiving for her father. She is following [employer], seeing Dr. Dolan and wanting to switch to atenolol  than bisoprolol  as better for the kidneys. Denies any chest pain, shortness of breath, palpitations, syncope, presyncope, dizziness, orthopnea, PND, swelling or significant weight changes, acute bleeding, or claudication.  ROS: Negative. See HPI.   Studies Reviewed  EKG: EKG is not ordered today.   Cardiac monitor 02/2024:  ZIO AT reviewed.  13 days, 14 hours analyzed.   Predominant rhythm is sinus with heart rate ranging from 55 bpm up to 101 bpm and average heart rate 73 bpm. There were rare PACs including atrial couplets and triplets representing less than 1% total beats. There were rare PVCs including ventricular couplets representing less than 1% total beats.  Also limited ventricular bigeminy. Two very brief episodes of PSVT were noted, the longest of which was only 5 beats.  No sustained atrial arrhythmias. Patient triggered episodes correlated somewhat with PACs. No pauses or high degree heart block.  Echo 03/2019:  1. Left ventricular ejection fraction, by visual estimation, is 60 to  65%. The left ventricle has normal function. There is moderately increased  left ventricular hypertrophy.   2. Left ventricular diastolic parameters are consistent with Grade II  diastolic dysfunction (pseudonormalization).   3. The left ventricle has no  regional wall motion abnormalities.   4. Global right ventricle has normal systolic function.The right  ventricular size is normal. No increase in right ventricular wall  thickness.   5. Left atrial size was normal.   6. Right atrial size was normal.   7. Mild mitral annular calcification.   8. The mitral valve is grossly normal. No evidence of mitral valve  regurgitation.   9. The tricuspid valve is grossly normal.  10. The tricuspid valve is grossly normal. Tricuspid valve regurgitation  is not demonstrated.  11. The aortic valve was not well visualized. Aortic valve regurgitation  is not visualized. No evidence of aortic valve sclerosis or stenosis.  12. The pulmonic valve was not well visualized. Pulmonic valve  regurgitation is not visualized.  13. The inferior vena cava is normal in size with greater than 50%  respiratory variability, suggesting right atrial pressure of 3 mmHg.  14. The interatrial septum was not well visualized.  Risk Assessment/Calculations     The 10-year ASCVD risk score (Arnett DK, et al., 2019) is: 11.2%   Values used to calculate the score:     Age: 66 years     Clinically relevant sex: Female     Is Non-Hispanic African American: No     Diabetic: No     Tobacco smoker: No     Systolic Blood Pressure: 144 mmHg     Is BP treated: Yes     HDL Cholesterol: 35 mg/dL     Total Cholesterol: 166 mg/dL      Physical Exam VS:  BP 132/84 (BP Location: Left Arm, Cuff Size: Normal)  Pulse 69   Ht 5' 5 (1.651 m)   Wt 178 lb (80.7 kg)   SpO2 96%   BMI 29.62 kg/m        Wt Readings from Last 3 Encounters:  03/11/24 178 lb (80.7 kg)  02/11/24 177 lb 4 oz (80.4 kg)  01/16/24 176 lb (79.8 kg)    GEN: Well nourished, well developed in no acute distress NECK: No JVD; No carotid bruits CARDIAC: S1/S2, RRR, Grade 2/6 murmur, no rubs, no gallops RESPIRATORY:  Clear to auscultation without rales, wheezing or rhonchi  ABDOMEN: Soft, non-tender,  non-distended EXTREMITIES:  No edema; No deformity   ASSESSMENT AND PLAN  Palpitations Denies any recent palpitations, wanting to see if she can start atenolol  instead of bisoprolol . Will run this past attending cardiologist. No current medication changes. Heart healthy diet and regular cardiovascular exercise encouraged.   HTN BP stable. Discussed to monitor BP at home at least 2 hours after medications and sitting for 5-10 minutes. Continue current medication regimen. Heart healthy diet and regular cardiovascular exercise encouraged.   Murmur Noted on exam. Asymptomatic. Will arrange Echo for further evaluation.   I spent a total duration of 30 minutes reviewing prior notes, reviewing outside records including  labs, face-to-face counseling of medical condition, pathophysiology, evaluation, management, and documenting the findings in the note.  Dispo: Will request records from Nephrology. Follow-up with MD/APP in 3 months or sooner if anything changes.   Signed, Almarie Crate, NP   "

## 2024-03-11 NOTE — Patient Instructions (Signed)
 Medication Instructions:  Your physician recommends that you continue on your current medications as directed. Please refer to the Current Medication list given to you today.   Labwork: None  Testing/Procedures: Your physician has requested that you have an echocardiogram. Echocardiography is a painless test that uses sound waves to create images of your heart. It provides your doctor with information about the size and shape of your heart and how well your hearts chambers and valves are working. This procedure takes approximately one hour. There are no restrictions for this procedure. Please do NOT wear cologne, perfume, aftershave, or lotions (deodorant is allowed). Please arrive 15 minutes prior to your appointment time.  Please note: We ask at that you not bring children with you during ultrasound (echo/ vascular) testing. Due to room size and safety concerns, children are not allowed in the ultrasound rooms during exams. Our front office staff cannot provide observation of children in our lobby area while testing is being conducted. An adult accompanying a patient to their appointment will only be allowed in the ultrasound room at the discretion of the ultrasound technician under special circumstances. We apologize for any inconvenience.   Follow-Up: Your physician recommends that you schedule a follow-up appointment in: 3 months  Any Other Special Instructions Will Be Listed Below (If Applicable). Thank you for choosing [facility]!     If you need a refill on your cardiac medications before your next appointment, please call your pharmacy.

## 2024-03-14 ENCOUNTER — Inpatient Hospital Stay: Payer: Self-pay | Attending: Hematology

## 2024-03-14 VITALS — BP 144/65 | HR 73 | Temp 99.2°F | Resp 18

## 2024-03-14 DIAGNOSIS — Z79899 Other long term (current) drug therapy: Secondary | ICD-10-CM | POA: Diagnosis not present

## 2024-03-14 DIAGNOSIS — D508 Other iron deficiency anemias: Secondary | ICD-10-CM

## 2024-03-14 DIAGNOSIS — D509 Iron deficiency anemia, unspecified: Secondary | ICD-10-CM | POA: Insufficient documentation

## 2024-03-14 MED ORDER — CYANOCOBALAMIN 1000 MCG/ML IJ SOLN
1000.0000 ug | Freq: Once | INTRAMUSCULAR | Status: AC
  Administered 2024-03-14: 1000 ug via INTRAMUSCULAR
  Filled 2024-03-14: qty 1

## 2024-03-14 NOTE — Progress Notes (Signed)
 Patient tolerated injection with no complaints voiced. Site clean and dry with no bruising or swelling noted at site. See MAR for details. Band aid applied.  Patient stable during and after injection. VSS with discharge and left in satisfactory condition with no s/s of distress noted.

## 2024-03-14 NOTE — Patient Instructions (Signed)
 [facility] [place] - A DEPT OF [facility]  Discharge Instructions: Thank you for choosing [facility] to provide your oncology and hematology care.  If you have a lab appointment with the Cancer Center - please note that after April 8th, 2024, all labs will be drawn in the cancer center.  You do not have to check in or register with the main entrance as you have in the past but will complete your check-in in the cancer center.  Wear comfortable clothing and clothing appropriate for easy access to any Portacath or PICC line.   We strive to give you quality time with your provider. You may need to reschedule your appointment if you arrive late (15 or more minutes).  Arriving late affects you and other patients whose appointments are after yours.  Also, if you miss three or more appointments without notifying the office, you may be dismissed from the clinic at the provider's discretion.      For prescription refill requests, have your pharmacy contact our office and allow 72 hours for refills to be completed.    Today you received the following B12 injection, return as scheduled.   To help prevent nausea and vomiting after your treatment, we encourage you to take your nausea medication as directed.  BELOW ARE SYMPTOMS THAT SHOULD BE REPORTED IMMEDIATELY: *FEVER GREATER THAN 100.4 F (38 C) OR HIGHER *CHILLS OR SWEATING *NAUSEA AND VOMITING THAT IS NOT CONTROLLED WITH YOUR NAUSEA MEDICATION *UNUSUAL SHORTNESS OF BREATH *UNUSUAL BRUISING OR BLEEDING *URINARY PROBLEMS (pain or burning when urinating, or frequent urination) *BOWEL PROBLEMS (unusual diarrhea, constipation, pain near the anus) TENDERNESS IN MOUTH AND THROAT WITH OR WITHOUT PRESENCE OF ULCERS (sore throat, sores in mouth, or a toothache) UNUSUAL RASH, SWELLING OR PAIN  UNUSUAL VAGINAL DISCHARGE OR ITCHING   Items with * indicate a potential emergency and should be followed up as soon as  possible or go to the Emergency Department if any problems should occur.  Please show the CHEMOTHERAPY ALERT CARD or IMMUNOTHERAPY ALERT CARD at check-in to the Emergency Department and triage nurse.  Should you have questions after your visit or need to cancel or reschedule your appointment, please contact [facility] [place] - A DEPT OF [facility] [phone]  and follow the prompts.  Office hours are 8:00 a.m. to 4:30 p.m. Monday - Friday. Please note that voicemails left after 4:00 p.m. may not be returned until the following business day.  We are closed weekends and major holidays. You have access to a nurse at all times for urgent questions. Please call the main number to the clinic [phone] and follow the prompts.  For any non-urgent questions, you may also contact your provider using MyChart. We now offer e-Visits for anyone 69 and older to request care online for non-urgent symptoms. For details visit [email or web address].   Also download the MyChart app! Go to the app store, search "MyChart", open the app, select [facility], and log in with your MyChart username and password.

## 2024-03-26 ENCOUNTER — Ambulatory Visit: Attending: Nurse Practitioner

## 2024-03-26 DIAGNOSIS — R011 Cardiac murmur, unspecified: Secondary | ICD-10-CM | POA: Insufficient documentation

## 2024-03-27 LAB — ECHOCARDIOGRAM COMPLETE
AR max vel: 2.14 cm2
AV Area VTI: 2.52 cm2
AV Area mean vel: 2.22 cm2
AV Mean grad: 6.5 mmHg
AV Peak grad: 15 mmHg
Ao pk vel: 1.94 m/s
Area-P 1/2: 3.05 cm2
Calc EF: 73.9 %
S' Lateral: 2.6 cm
Single Plane A2C EF: 75.1 %
Single Plane A4C EF: 71.1 %

## 2024-03-31 ENCOUNTER — Ambulatory Visit: Payer: Self-pay | Admitting: Nurse Practitioner

## 2024-04-13 ENCOUNTER — Encounter: Payer: Self-pay | Admitting: *Deleted

## 2024-04-14 ENCOUNTER — Inpatient Hospital Stay: Payer: Self-pay

## 2024-04-16 ENCOUNTER — Inpatient Hospital Stay: Attending: Hematology

## 2024-04-16 VITALS — BP 151/77 | HR 72 | Resp 18

## 2024-04-16 DIAGNOSIS — D508 Other iron deficiency anemias: Secondary | ICD-10-CM

## 2024-04-16 MED ORDER — CYANOCOBALAMIN 1000 MCG/ML IJ SOLN
1000.0000 ug | Freq: Once | INTRAMUSCULAR | Status: AC
  Administered 2024-04-16: 1000 ug via INTRAMUSCULAR
  Filled 2024-04-16: qty 1

## 2024-04-16 NOTE — Progress Notes (Signed)
 Patient tolerated injection with no complaints voiced. Site clean and dry with no bruising or swelling noted at site. See MAR for details. Band aid applied.  Patient stable during and after injection. VSS with discharge and left in satisfactory condition with no s/s of distress noted.

## 2024-04-16 NOTE — Patient Instructions (Signed)
 [facility] [place] - A DEPT OF [facility]  Discharge Instructions: Thank you for choosing [facility] to provide your oncology and hematology care.  If you have a lab appointment with the Cancer Center - please note that after April 8th, 2024, all labs will be drawn in the cancer center.  You do not have to check in or register with the main entrance as you have in the past but will complete your check-in in the cancer center.  Wear comfortable clothing and clothing appropriate for easy access to any Portacath or PICC line.   We strive to give you quality time with your provider. You may need to reschedule your appointment if you arrive late (15 or more minutes).  Arriving late affects you and other patients whose appointments are after yours.  Also, if you miss three or more appointments without notifying the office, you may be dismissed from the clinic at the provider's discretion.      For prescription refill requests, have your pharmacy contact our office and allow 72 hours for refills to be completed.    Today you received the following B12 return as scheduled.   To help prevent nausea and vomiting after your treatment, we encourage you to take your nausea medication as directed.  BELOW ARE SYMPTOMS THAT SHOULD BE REPORTED IMMEDIATELY: *FEVER GREATER THAN 100.4 F (38 C) OR HIGHER *CHILLS OR SWEATING *NAUSEA AND VOMITING THAT IS NOT CONTROLLED WITH YOUR NAUSEA MEDICATION *UNUSUAL SHORTNESS OF BREATH *UNUSUAL BRUISING OR BLEEDING *URINARY PROBLEMS (pain or burning when urinating, or frequent urination) *BOWEL PROBLEMS (unusual diarrhea, constipation, pain near the anus) TENDERNESS IN MOUTH AND THROAT WITH OR WITHOUT PRESENCE OF ULCERS (sore throat, sores in mouth, or a toothache) UNUSUAL RASH, SWELLING OR PAIN  UNUSUAL VAGINAL DISCHARGE OR ITCHING   Items with * indicate a potential emergency and should be followed up as soon as possible or go to  the Emergency Department if any problems should occur.  Please show the CHEMOTHERAPY ALERT CARD or IMMUNOTHERAPY ALERT CARD at check-in to the Emergency Department and triage nurse.  Should you have questions after your visit or need to cancel or reschedule your appointment, please contact [facility]ER [facility] [place] - A DEPT OF [facility] [phone]  and follow the prompts.  Office hours are 8:00 a.m. to 4:30 p.m. Monday - Friday. Please note that voicemails left after 4:00 p.m. may not be returned until the following business day.  We are closed weekends and major holidays. You have access to a nurse at all times for urgent questions. Please call the main number to the clinic [phone] and follow the prompts.  For any non-urgent questions, you may also contact your provider using MyChart. We now offer e-Visits for anyone 33 and older to request care online for non-urgent symptoms. For details visit [email or web address].   Also download the MyChart app! Go to the app store, search "MyChart", open the app, select [place], and log in with your MyChart username and password.

## 2024-05-12 ENCOUNTER — Inpatient Hospital Stay: Payer: Self-pay | Attending: Hematology

## 2024-06-12 ENCOUNTER — Inpatient Hospital Stay: Payer: Self-pay | Attending: Hematology

## 2024-06-12 ENCOUNTER — Ambulatory Visit: Admitting: Nurse Practitioner

## 2024-07-07 ENCOUNTER — Inpatient Hospital Stay: Payer: Self-pay

## 2024-07-07 ENCOUNTER — Inpatient Hospital Stay

## 2024-07-14 ENCOUNTER — Inpatient Hospital Stay: Payer: Self-pay

## 2024-07-14 ENCOUNTER — Inpatient Hospital Stay: Payer: Self-pay | Attending: Hematology | Admitting: Physician Assistant
